# Patient Record
Sex: Female | Born: 1945
Health system: Southern US, Community
[De-identification: ages and names within clinical notes are randomized; demographics above are authoritative.]

## PROBLEM LIST (undated history)

## (undated) DIAGNOSIS — I10 Essential (primary) hypertension: Secondary | ICD-10-CM

## (undated) DIAGNOSIS — E785 Hyperlipidemia, unspecified: Secondary | ICD-10-CM

## (undated) DIAGNOSIS — N393 Stress incontinence (female) (male): Secondary | ICD-10-CM

## (undated) DIAGNOSIS — K259 Gastric ulcer, unspecified as acute or chronic, without hemorrhage or perforation: Secondary | ICD-10-CM

## (undated) DIAGNOSIS — J189 Pneumonia, unspecified organism: Secondary | ICD-10-CM

## (undated) DIAGNOSIS — K7689 Other specified diseases of liver: Secondary | ICD-10-CM

## (undated) DIAGNOSIS — R011 Cardiac murmur, unspecified: Secondary | ICD-10-CM

## (undated) DIAGNOSIS — T7840XA Allergy, unspecified, initial encounter: Secondary | ICD-10-CM

## (undated) DIAGNOSIS — F419 Anxiety disorder, unspecified: Secondary | ICD-10-CM

## (undated) DIAGNOSIS — T8859XA Other complications of anesthesia, initial encounter: Secondary | ICD-10-CM

## (undated) DIAGNOSIS — K219 Gastro-esophageal reflux disease without esophagitis: Secondary | ICD-10-CM

## (undated) DIAGNOSIS — A048 Other specified bacterial intestinal infections: Secondary | ICD-10-CM

## (undated) DIAGNOSIS — H269 Unspecified cataract: Secondary | ICD-10-CM

## (undated) DIAGNOSIS — M1990 Unspecified osteoarthritis, unspecified site: Secondary | ICD-10-CM

## (undated) DIAGNOSIS — K635 Polyp of colon: Secondary | ICD-10-CM

## (undated) DIAGNOSIS — N83209 Unspecified ovarian cyst, unspecified side: Secondary | ICD-10-CM

## (undated) HISTORY — DX: Anxiety disorder, unspecified: F41.9

## (undated) HISTORY — DX: Gastro-esophageal reflux disease without esophagitis: K21.9

## (undated) HISTORY — DX: Hyperlipidemia, unspecified: E78.5

## (undated) HISTORY — DX: Other specified diseases of liver: K76.89

## (undated) HISTORY — PX: HEMORRHOID SURGERY: SHX153

## (undated) HISTORY — DX: Polyp of colon: K63.5

## (undated) HISTORY — DX: Cardiac murmur, unspecified: R01.1

## (undated) HISTORY — DX: Other specified bacterial intestinal infections: A04.8

## (undated) HISTORY — DX: Unspecified ovarian cyst, unspecified side: N83.209

## (undated) HISTORY — DX: Gastric ulcer, unspecified as acute or chronic, without hemorrhage or perforation: K25.9

## (undated) HISTORY — PX: OVARIAN CYST SURGERY: SHX726

## (undated) HISTORY — PX: COLON SURGERY: SHX602

## (undated) HISTORY — DX: Pneumonia, unspecified organism: J18.9

## (undated) HISTORY — DX: Unspecified osteoarthritis, unspecified site: M19.90

## (undated) HISTORY — PX: LAPAROSCOPIC LIVER CYST REMOVAL: SHX5900

## (undated) HISTORY — DX: Essential (primary) hypertension: I10

## (undated) HISTORY — PX: APPENDECTOMY: SHX54

## (undated) HISTORY — DX: Unspecified cataract: H26.9

## (undated) HISTORY — DX: Allergy, unspecified, initial encounter: T78.40XA

---

## 1982-03-22 DIAGNOSIS — N83209 Unspecified ovarian cyst, unspecified side: Secondary | ICD-10-CM

## 1982-03-22 HISTORY — DX: Unspecified ovarian cyst, unspecified side: N83.209

## 1997-09-17 ENCOUNTER — Other Ambulatory Visit: Admission: RE | Admit: 1997-09-17 | Discharge: 1997-09-17 | Payer: Self-pay | Admitting: Obstetrics and Gynecology

## 1998-02-18 ENCOUNTER — Ambulatory Visit (HOSPITAL_COMMUNITY): Admission: RE | Admit: 1998-02-18 | Discharge: 1998-02-18 | Payer: Self-pay | Admitting: Obstetrics and Gynecology

## 1998-03-22 HISTORY — PX: DENTAL SURGERY: SHX609

## 1999-02-09 ENCOUNTER — Encounter (INDEPENDENT_AMBULATORY_CARE_PROVIDER_SITE_OTHER): Payer: Self-pay

## 1999-02-09 ENCOUNTER — Ambulatory Visit (HOSPITAL_COMMUNITY): Admission: RE | Admit: 1999-02-09 | Discharge: 1999-02-09 | Payer: Self-pay | Admitting: *Deleted

## 1999-06-15 ENCOUNTER — Encounter: Admission: RE | Admit: 1999-06-15 | Discharge: 1999-06-15 | Payer: Self-pay | Admitting: Family Medicine

## 2000-01-28 ENCOUNTER — Encounter: Admission: RE | Admit: 2000-01-28 | Discharge: 2000-01-28 | Payer: Self-pay | Admitting: Family Medicine

## 2000-06-15 ENCOUNTER — Other Ambulatory Visit: Admission: RE | Admit: 2000-06-15 | Discharge: 2000-06-15 | Payer: Self-pay | Admitting: Obstetrics and Gynecology

## 2000-08-11 ENCOUNTER — Encounter: Payer: Self-pay | Admitting: General Surgery

## 2000-08-12 ENCOUNTER — Encounter (INDEPENDENT_AMBULATORY_CARE_PROVIDER_SITE_OTHER): Payer: Self-pay | Admitting: Specialist

## 2000-08-12 ENCOUNTER — Ambulatory Visit (HOSPITAL_COMMUNITY): Admission: RE | Admit: 2000-08-12 | Discharge: 2000-08-12 | Payer: Self-pay | Admitting: General Surgery

## 2002-05-16 ENCOUNTER — Encounter: Payer: Self-pay | Admitting: Family Medicine

## 2002-05-16 ENCOUNTER — Encounter: Admission: RE | Admit: 2002-05-16 | Discharge: 2002-05-16 | Payer: Self-pay | Admitting: Family Medicine

## 2003-01-01 ENCOUNTER — Ambulatory Visit (HOSPITAL_COMMUNITY): Admission: RE | Admit: 2003-01-01 | Discharge: 2003-01-01 | Payer: Self-pay | Admitting: *Deleted

## 2003-05-21 ENCOUNTER — Encounter: Admission: RE | Admit: 2003-05-21 | Discharge: 2003-05-21 | Payer: Self-pay | Admitting: Family Medicine

## 2004-06-03 ENCOUNTER — Encounter: Admission: RE | Admit: 2004-06-03 | Discharge: 2004-06-03 | Payer: Self-pay | Admitting: Family Medicine

## 2004-06-24 ENCOUNTER — Encounter: Admission: RE | Admit: 2004-06-24 | Discharge: 2004-06-24 | Payer: Self-pay | Admitting: Family Medicine

## 2004-12-07 ENCOUNTER — Encounter: Admission: RE | Admit: 2004-12-07 | Discharge: 2004-12-07 | Payer: Self-pay | Admitting: Family Medicine

## 2005-06-08 ENCOUNTER — Ambulatory Visit (HOSPITAL_BASED_OUTPATIENT_CLINIC_OR_DEPARTMENT_OTHER): Admission: RE | Admit: 2005-06-08 | Discharge: 2005-06-08 | Payer: Self-pay | Admitting: Orthopedic Surgery

## 2005-06-08 ENCOUNTER — Encounter (INDEPENDENT_AMBULATORY_CARE_PROVIDER_SITE_OTHER): Payer: Self-pay | Admitting: *Deleted

## 2005-07-14 ENCOUNTER — Encounter: Admission: RE | Admit: 2005-07-14 | Discharge: 2005-07-14 | Payer: Self-pay | Admitting: Family Medicine

## 2006-07-27 ENCOUNTER — Encounter: Admission: RE | Admit: 2006-07-27 | Discharge: 2006-07-27 | Payer: Self-pay | Admitting: Family Medicine

## 2007-10-06 ENCOUNTER — Encounter: Admission: RE | Admit: 2007-10-06 | Discharge: 2007-10-06 | Payer: Self-pay | Admitting: Family Medicine

## 2008-10-07 ENCOUNTER — Encounter: Admission: RE | Admit: 2008-10-07 | Discharge: 2008-10-07 | Payer: Self-pay | Admitting: Family Medicine

## 2008-12-06 ENCOUNTER — Ambulatory Visit (HOSPITAL_BASED_OUTPATIENT_CLINIC_OR_DEPARTMENT_OTHER): Admission: RE | Admit: 2008-12-06 | Discharge: 2008-12-06 | Payer: Self-pay | Admitting: Orthopedic Surgery

## 2010-03-26 ENCOUNTER — Encounter
Admission: RE | Admit: 2010-03-26 | Discharge: 2010-03-26 | Payer: Self-pay | Source: Home / Self Care | Attending: Family Medicine | Admitting: Family Medicine

## 2010-08-07 NOTE — Op Note (Signed)
[facility]  Patient:    Kristen Schultz, Kristen Schultz                          MRN: [id number] Proc. Date: 08/12/00 Attending:  Sheppard Plumber. Earlene Plater, M.D.                           Operative Report  PREOPERATIVE DIAGNOSIS:  Internal and external hemorrhoids.  POSTOPERATIVE DIAGNOSIS:  Internal and external hemorrhoids.  OPERATION:  Hemorrhoidectomy.  SURGEON:  Timothy E. Earlene Plater, M.D.  ANESTHESIA:  Local standby.  INDICATIONS:  Ms. Stawicki had had prolapsing, soilage, bleeding and pain from her hemorrhoids.  She has used conservative management to the maximum and continues to have trouble.  After careful discussion, she wishes to proceed with surgical correction.  DESCRIPTION OF PROCEDURE:  The patient was brought to the operating room and placed supine, heavy IV sedation provided, placed in lithotomy.  The area inspected and prepped and draped in the usual fashion.  Remarkable hemorrhoids were noted in the right posterior, left posterior, and left anterior positions.  There was an additional internal hemorrhoid in the anterior position.  The area was injected around and about with Marcaine plain mixed 9:1 with Wydase.  This was massaged in well; the normal anatomy was restored, and then careful dissection was accomplished, removing all three of the major hemorrhoids in a similar fashion by placing a 2-0 chromic at the apex of the hemorrhoids and careful skinny dissection of the hemorrhoidal mass, undermining of the edges, and closure of the wound with a running 2-0 chromic suture.  This was accomplished on the left anterior, left posterior, and right posterior positions.  The anterior internal hemorrhoid was band ligated. All areas were checked.  There was no bleeding or complication.  Sphincters were intact.  Gelfoam, gauze, and dry sterile dressing applied.  She tolerated it well and was taken to the recovery room in good condition.  Written and verbal instructions  were given her and her husband including Percocet 5 mg #36, and she will be followed as an outpatient. DD:  08/12/00 TD:  08/12/00 Job: [id number]

## 2010-08-07 NOTE — Op Note (Signed)
   NAME:  Kristen Schultz, Kristen Schultz                           ACCOUNT NO.:  [id number]   MEDICAL RECORD NO.:  [id number]                   PATIENT TYPE:  AMB   LOCATION:  ENDO                                 FACILITY:  [facility]   PHYSICIAN:  Georgiana Spinner, M.D.                 DATE OF BIRTH:  [birth year]   DATE OF PROCEDURE:  01/01/2003  DATE OF DISCHARGE:                                 OPERATIVE REPORT   PROCEDURE:  Colonoscopy.   ENDOSCOPIST:  Georgiana Spinner, M.D.   INDICATIONS:  Colon polyps.   ANESTHESIA:  Demerol 70 mg, Versed 7 mg.   DESCRIPTION OF PROCEDURE:  With the patient mildly sedated in the left  lateral decubitus position, the Olympus videoscopic colonoscope was inserted  in the rectum  after normal rectal exam and passed under direct vision to  the cecum, identified by ileocecal valve and appendiceal orifice both of  which were photographed.  From this point, the colonoscope was slowly  withdrawn taking circumferential views of the entire colonic mucosa stopping  to photograph moderate degree of diverticulosis in the sigmoid colon until  we reached the rectum which appeared normal on direct and showed small  hemorrhoids on retroflexed view.  The endoscope was straightened and  withdrawn.  The patient's vital signs and pulse oximeter remained stable.  The patient tolerated the procedure well without apparent complications.   FINDINGS:  1. Diverticulosis of the sigmoid colon.  2. Internal hemorrhoids.  3. Otherwise unremarkable colonoscopic examination to the cecum.   PLAN:  Repeat examination in 5 years because of previous polyps.                                               Georgiana Spinner, M.D.    GMO/MEDQ  D:  01/01/2003  T:  01/01/2003  Job:  [id number]   cc:   Tammy R. Collins [place], M.D.  [address]  [place]  [place] [address]  Fax: [phone]

## 2010-08-07 NOTE — Op Note (Signed)
NAMERUBIE, FICCO                 ACCOUNT NO.:  [id number]   MEDICAL RECORD NO.:  [id number]          PATIENT TYPE:  AMB   LOCATION:  DSC                          FACILITY:  [facility]   PHYSICIAN:  Katy Fitch. Sypher, M.D. DATE OF BIRTH:  10-31-1945   DATE OF PROCEDURE:  06/08/2005  DATE OF DISCHARGE:                                 OPERATIVE REPORT   PREOPERATIVE DIAGNOSES:  1.  Chronic splinter foreign body, right long finger, middle phalangeal      palmar segment.  2.  Chronic stenosing tenosynovitis, left long finger, at A1 pulley with      marked swelling of flexor sheath consistent with chronic synovitis.   POSTOPERATIVE DIAGNOSES:  1.  Chronic splinter foreign body, right long finger, middle phalangeal      palmar segment.  2.  Chronic stenosing tenosynovitis, left long finger, at A1 pulley with      marked swelling of flexor sheath consistent with chronic synovitis.   OPERATION:  1.  Excision of right long finger deep splinter with incision and drainage      of small abscess surrounding splinter.  2.  Release of left long finger A1 pulley with synovial biopsy and      synovectomy.   OPERATING SURGEON:  Katy Fitch. Sypher, M.D.   ASSISTANT:  Annye Rusk, P.A.-C.   ANESTHESIA:  General sedation and 2% lidocaine metacarpal head level block  of right and left long fingers.   SUPERVISING ANESTHESIOLOGIST:  Zenon Mayo, MD   INDICATIONS:  Anasophia Pecor is a 65 year old woman referred for evaluation and  management of a very arthritic right long finger.   She was noted to have significant ulnar deviation of the right long finger  at the PIP joint and only 0-65 degrees active flexion.  She was having  increasing pain.  She was noted have a chronic wood foreign body splinter  with a small abscess surrounding this splinter.  She requested removal of  the splinter.  We advised her that we could not consider implant  arthroplasty while removing a probably colonized  foreign body.   In addition, she was noted to have a chronic stenosing tenosynovitis of her  left long finger with swelling over the flexor sheath and pain at the A1  pulley.  She had active locking in flexion.   After a lengthy informed consent in the office, she is scheduled this time  for removal of her wood foreign body from the right long finger and release  of her left long finger A1 pulley, anticipating synovial biopsy and  synovectomy.   PROCEDURE:  Camya Haydon was brought to the operating room and placed in  supine position upon the operating table.   Following the induction of general sedation, the right and left arms were  prepped with Betadine soaping solution and sterilely draped.  A pneumatic  tourniquet was applied to the proximal left brachium.  A digital tourniquet  will be used on the right long finger.   We initiated the procedure on the left.   The left arm was exsanguinated with  an Esmarch bandage and the arterial  tourniquet on the proximal brachium inflated to 250 mmHg.  The procedure  commenced with infiltration of 2% lidocaine into the path of the intended  incision and into the flexor sheath of the left long finger.   The skin incision was taken sharply when anesthesia was confirmed to be  satisfactory.   A 1.5-cm oblique incision was fashioned in the distal palmar crease.  Subcutaneous tissues were carefully divided, taking care to release the  palmar fascia.  The A1 pulley was isolated and found be bulging with  synovium.  A rongeur was used to clear the synovium penetrating through the  pulley and after careful identification of the margins of the pulley and  retraction of the neurovascular bundles, the A1 pulley was released along  its radial aspect with a scalpel and scissors.  No A0 pulley was noted.  The  release was extended towards the junction between the A1 and A2 pulleys.   The tendons were delivered and found be invested with a very thick   tenosynovium.  This was harvested and passed off for synovial biopsy.  After  synovectomy was completed, full range of motion of the left long finger was  recovered.   The wound was then repaired with mattress suture of 5-0 nylon x3.  There  were no apparent complications.  The wound was dressed with Xeroflo, sterile  gauze and an Ace bandage.  The tourniquet released with immediate capillary  refill to all fingers and the thumb.   Attention was then directed to the right long finger.   After routine Betadine prep and drape of the right hand, the right long  finger was anesthetized with a 2% lidocaine metacarpal head level block.   When anesthesia was satisfactory, the finger was exsanguinated with a gauze  wrap and an inch-wide band of Esmarch bandage was used as a drain-equivalent  at the P1 segment as a tourniquet.   A small incision was fashioned over the point of maximal tenderness with  immediate recovery of purulent material.  The wound was carefully palpated  and the splinter expressed.  The wound was then probed with a blunt hemostat  and no other masses identified.  There was a significant amount of fibrosis  surrounding the chronic abscess.  The  abscess was irrigated and subsequently will be allowed to close by secondary  intention.  The finger was dressed with Xeroflo, sterile gauze and Coban.  There were no apparent complications..   Ms. Mazurek was awakened from her sedation and transferred to the recovery  room with stable signs.      Katy Fitch Sypher, M.D.  Electronically Signed     RVS/MEDQ  D:  06/08/2005  T:  06/09/2005  Job:  [id number]

## 2011-06-29 ENCOUNTER — Telehealth: Payer: Self-pay

## 2011-06-29 NOTE — Telephone Encounter (Signed)
Pt called asking For Copies Of her Records, she will be by 06/30/11 To pick these Up, Records are Copied & Ready,ROI needs to  Be Signed.... 06/29/11/Km

## 2011-06-30 ENCOUNTER — Other Ambulatory Visit: Payer: Self-pay | Admitting: Family Medicine

## 2011-06-30 DIAGNOSIS — Z1231 Encounter for screening mammogram for malignant neoplasm of breast: Secondary | ICD-10-CM

## 2011-06-30 DIAGNOSIS — M8580 Other specified disorders of bone density and structure, unspecified site: Secondary | ICD-10-CM

## 2011-06-30 NOTE — Telephone Encounter (Signed)
Records Picked up By Pt  06/30/11/KM

## 2011-07-29 ENCOUNTER — Ambulatory Visit
Admission: RE | Admit: 2011-07-29 | Discharge: 2011-07-29 | Disposition: A | Discharge status: Home / Self Care | Payer: Medicare Other | Source: Ambulatory Visit | Attending: Family Medicine | Admitting: Family Medicine

## 2011-07-29 DIAGNOSIS — Z1231 Encounter for screening mammogram for malignant neoplasm of breast: Secondary | ICD-10-CM

## 2011-07-29 DIAGNOSIS — M8580 Other specified disorders of bone density and structure, unspecified site: Secondary | ICD-10-CM

## 2011-08-04 ENCOUNTER — Encounter: Payer: Self-pay | Admitting: *Deleted

## 2012-06-27 ENCOUNTER — Other Ambulatory Visit: Payer: Self-pay

## 2012-06-27 DIAGNOSIS — Z1231 Encounter for screening mammogram for malignant neoplasm of breast: Secondary | ICD-10-CM

## 2012-08-03 ENCOUNTER — Ambulatory Visit: Payer: Medicare Other

## 2012-08-08 ENCOUNTER — Other Ambulatory Visit: Payer: Self-pay | Admitting: Family Medicine

## 2012-08-08 DIAGNOSIS — R202 Paresthesia of skin: Secondary | ICD-10-CM

## 2012-08-08 DIAGNOSIS — M79629 Pain in unspecified upper arm: Secondary | ICD-10-CM

## 2012-08-22 ENCOUNTER — Ambulatory Visit
Admission: RE | Admit: 2012-08-22 | Discharge: 2012-08-22 | Disposition: A | Discharge status: Home / Self Care | Payer: Medicare Other | Source: Ambulatory Visit | Attending: Family Medicine | Admitting: Family Medicine

## 2012-08-22 DIAGNOSIS — M79629 Pain in unspecified upper arm: Secondary | ICD-10-CM

## 2012-08-22 DIAGNOSIS — R202 Paresthesia of skin: Secondary | ICD-10-CM

## 2014-11-08 ENCOUNTER — Telehealth: Payer: Self-pay | Admitting: Behavioral Health

## 2014-11-08 ENCOUNTER — Encounter: Payer: Self-pay | Admitting: Behavioral Health

## 2014-11-08 NOTE — Telephone Encounter (Signed)
Pre-Visit Call completed with patient and chart updated.   Pre-Visit Info documented in Specialty Comments under SnapShot.    

## 2014-11-08 NOTE — Telephone Encounter (Signed)
Unable to reach patient at time of Pre-Visit Call.  Left message for patient to return call when available.    

## 2014-11-08 NOTE — Addendum Note (Signed)
Addended by: Kathlen Brunswick on: 11/08/2014 05:02 PM   Modules accepted: Orders, Medications

## 2014-11-11 ENCOUNTER — Ambulatory Visit (INDEPENDENT_AMBULATORY_CARE_PROVIDER_SITE_OTHER): Payer: Medicare Other | Admitting: Family Medicine

## 2014-11-11 ENCOUNTER — Encounter: Payer: Self-pay | Admitting: Family Medicine

## 2014-11-11 VITALS — BP 140/82 | HR 67 | Temp 98.0°F | Ht 66.0 in | Wt 162.0 lb

## 2014-11-11 DIAGNOSIS — L0100 Impetigo, unspecified: Secondary | ICD-10-CM

## 2014-11-11 DIAGNOSIS — H9313 Tinnitus, bilateral: Secondary | ICD-10-CM | POA: Diagnosis not present

## 2014-11-11 DIAGNOSIS — E2839 Other primary ovarian failure: Secondary | ICD-10-CM | POA: Diagnosis not present

## 2014-11-11 DIAGNOSIS — I10 Essential (primary) hypertension: Secondary | ICD-10-CM | POA: Diagnosis not present

## 2014-11-11 DIAGNOSIS — Z1231 Encounter for screening mammogram for malignant neoplasm of breast: Secondary | ICD-10-CM | POA: Diagnosis not present

## 2014-11-11 DIAGNOSIS — F411 Generalized anxiety disorder: Secondary | ICD-10-CM

## 2014-11-11 LAB — POCT URINALYSIS DIPSTICK
Bilirubin, UA: NEGATIVE
Glucose, UA: NEGATIVE
KETONES UA: NEGATIVE
LEUKOCYTES UA: NEGATIVE
NITRITE UA: NEGATIVE
PH UA: 8
PROTEIN UA: NEGATIVE
RBC UA: NEGATIVE
Spec Grav, UA: 1.015
Urobilinogen, UA: 2

## 2014-11-11 LAB — COMPREHENSIVE METABOLIC PANEL
ALK PHOS: 52 U/L (ref 39–117)
ALT: 13 U/L (ref 0–35)
AST: 23 U/L (ref 0–37)
Albumin: 4.2 g/dL (ref 3.5–5.2)
BILIRUBIN TOTAL: 0.6 mg/dL (ref 0.2–1.2)
BUN: 15 mg/dL (ref 6–23)
CO2: 32 meq/L (ref 19–32)
Calcium: 9.5 mg/dL (ref 8.4–10.5)
Chloride: 98 mEq/L (ref 96–112)
Creatinine, Ser: 0.64 mg/dL (ref 0.40–1.20)
GFR: 97.88 mL/min (ref >60.00)
GLUCOSE: 84 mg/dL (ref 70–99)
Potassium: 4.1 mEq/L (ref 3.5–5.1)
SODIUM: 137 meq/L (ref 135–145)
TOTAL PROTEIN: 7.3 g/dL (ref 6.0–8.3)

## 2014-11-11 LAB — LIPID PANEL
CHOL/HDL RATIO: 3
Cholesterol: 228 mg/dL — ABNORMAL HIGH (ref 0–200)
HDL: 77 mg/dL (ref >39.00)
LDL Cholesterol: 130 mg/dL — ABNORMAL HIGH (ref 0–99)
NONHDL: 151.45
Triglycerides: 106 mg/dL (ref 0.0–149.0)
VLDL: 21.2 mg/dL (ref 0.0–40.0)

## 2014-11-11 MED ORDER — LOSARTAN POTASSIUM-HCTZ 50-12.5 MG PO TABS: 1.0000 | ORAL_TABLET | Freq: Every day | ORAL | Status: DC

## 2014-11-11 MED ORDER — TRIAMCINOLONE ACETONIDE 0.1 % EX CREA: 1.0000 "application " | TOPICAL_CREAM | Freq: Two times a day (BID) | CUTANEOUS | Status: DC

## 2014-11-11 MED ORDER — CITALOPRAM HYDROBROMIDE 10 MG PO TABS: 10.0000 mg | ORAL_TABLET | Freq: Every day | ORAL | Status: DC

## 2014-11-11 MED ORDER — MUPIROCIN CALCIUM 2 % EX CREA: 1.0000 "application " | TOPICAL_CREAM | Freq: Three times a day (TID) | CUTANEOUS | Status: DC

## 2014-11-11 NOTE — Progress Notes (Signed)
Pre visit review using our clinic review tool, if applicable. No additional management support is needed unless otherwise documented below in the visit note. 

## 2014-11-11 NOTE — Progress Notes (Signed)
Patient ID: Kristen Schultz, female   DOB: 18-Nov-1945, 69 y.o.   MRN: [id number]   RUHEE ENCK  female [id number] Feb 01, 1946 69 y.o. 11/12/2014      Progress Note-Follow Up  Subjective   HPI  Patient is in today to establish and c/o rash on L side of chin. She has tried otc creams with no relief     Chief Complaint  Patient presents with  . Establish Care    Medication Refills- Requesting labs but not fasting.  . Rash    on the left side of chin x's 2 months    Past Medical History  Diagnosis Date  . Hypertension   . Hyperlipidemia   . Anxiety     Past Surgical History  Procedure Laterality Date  . Hemorrhoid surgery    . Laparoscopic liver cyst removal    . Ovarian cyst surgery      Family History  Problem Relation Age of Onset  . Heart disease Neg Hx   . Glaucoma Neg Hx     Social History   Social History  . Marital Status: Married    Spouse Name: N/A  . Number of Children: N/A  . Years of Education: N/A   Occupational History  . Not on file.   Social History Main Topics  . Smoking status: Former Research scientist (life sciences)  . Smokeless tobacco: Not on file  . Alcohol Use: No  . Drug Use: Not on file  . Sexual Activity: Not on file   Other Topics Concern  . Not on file   Social History Narrative    Current Outpatient Prescriptions on File Prior to Visit  Medication Sig Dispense Refill  . Ascorbic Acid (VITAMIN C) 1000 MG tablet Take 1,000 mg by mouth 3 (three) times a week.     Marland Kitchen aspirin 81 MG tablet Take 81 mg by mouth daily.    . Multiple Vitamin (MULTIVITAMIN) capsule Take 1 capsule by mouth daily.     No current facility-administered medications on file prior to visit.    Allergies  Allergen Reactions  . Codeine     Extreme Vomiting  . Penicillins     Redness, swelling and itching  . Tetracyclines & Related     Yeast Infection and Stomach problems    Review of Systems  Review of Systems  Constitutional: Negative for fever, chills and  malaise/fatigue.  HENT: Negative for congestion and hearing loss.   Eyes: Negative for discharge.  Respiratory: Negative for cough, sputum production and shortness of breath.   Cardiovascular: Negative for chest pain, palpitations and leg swelling.  Gastrointestinal: Negative for heartburn, nausea, vomiting, abdominal pain, diarrhea, constipation and blood in stool.  Genitourinary: Negative for dysuria, urgency, frequency and hematuria.  Musculoskeletal: Negative for myalgias, back pain and falls.  Skin: Negative for rash.  Neurological: Negative for dizziness, sensory change, loss of consciousness, weakness and headaches.  Endo/Heme/Allergies: Negative for environmental allergies. Does not bruise/bleed easily.  Psychiatric/Behavioral: Negative for depression and suicidal ideas. The patient is not nervous/anxious and does not have insomnia.     Objective  Filed Vitals:   11/11/14 1042  BP: 140/82  Pulse: 67  Temp: 98 F (36.7 C)  TempSrc: Oral  Height: 5\' 6"  (1.676 m)  Weight: 162 lb (73.483 kg)  SpO2: 98%   Body mass index is 26.16 kg/(m^2).  Physical Exam  Physical Exam  Constitutional: She is well-developed, well-nourished, and in no distress. No distress.  HENT:  Right Ear: External ear  normal.  Left Ear: External ear normal.  Mouth/Throat: No oropharyngeal exudate.  Eyes: Conjunctivae are normal. Pupils are equal, round, and reactive to light. Right eye exhibits no discharge. Left eye exhibits no discharge. No scleral icterus.  Neck: Normal range of motion. Neck supple. No JVD present. No tracheal deviation present.  Cardiovascular: Normal rate and regular rhythm.   No murmur heard. Pulmonary/Chest: Effort normal and breath sounds normal. No stridor. No respiratory distress.  Abdominal: Soft. Bowel sounds are normal. She exhibits no distension. There is no tenderness. There is no rebound and no guarding.  Musculoskeletal: Normal range of motion. She exhibits no edema  or tenderness.  Lymphadenopathy:    She has no cervical adenopathy.  Neurological: She is alert. She has normal reflexes. Gait normal.  Skin: Rash noted. She is not diaphoretic.     Psychiatric: Memory, affect and judgment normal.    No results found for: TSH No results found for: WBC, HGB, HCT, MCV, PLT Lab Results  Component Value Date   GFR 97.88 11/11/2014   Lab Results  Component Value Date   CHOL 228* 11/11/2014   Lab Results  Component Value Date   HDL 77.00 11/11/2014   Lab Results  Component Value Date   LDLCALC 130* 11/11/2014   Lab Results  Component Value Date   TRIG 106.0 11/11/2014   Lab Results  Component Value Date   CHOLHDL 3 11/11/2014   No results found for: HGBA1C    Assessment & Plan 1. Encounter for screening mammogram for malignant neoplasm of breast   - MM Digital Screening; Future  2. Estrogen deficiency   - DG Bone Density; Future  3. Tinnitus, bilateral    - Ambulatory referral to Audiology  4. Generalized anxiety disorder  celexa  5. Essential hypertension losartan  - Comprehensive metabolic panel - Lipid panel - POCT urinalysis dipstick  6. Impetigo bactroban and cortisone cream

## 2014-11-11 NOTE — Patient Instructions (Addendum)
Your Mammogram and Bone Density Scan has been scheduled for January 09, 2015  at 9 am  Address: [address], [place], [place] [address]  Hours:   Monday 7AM-6:30PM  Tuesday 7AM-5PM  Wednesday 7AM-5PM  Thursday 7AM-5PM  Friday 7AM-5PM  Saturday Closed  Sunday Closed  Phone: [phone]   Tinnitus Sounds you hear in your ears and coming from within the ear is called tinnitus. This can be a symptom of many ear disorders. It is often associated with hearing loss.  Tinnitus can be seen with:  Infections.  Ear blockages such as wax buildup.  Meniere's disease.  Ear damage.  Inherited.  Occupational causes. While irritating, it is not usually a threat to health. When the cause of the tinnitus is wax, infection in the middle ear, or foreign body it is easily treated. Hearing loss will usually be reversible.  TREATMENT  When treating the underlying cause does not get rid of tinnitus, it may be necessary to get rid of the unwanted sound by covering it up with more pleasant background noises. This may include music, the radio etc. There are tinnitus maskers which can be worn which produce background noise to cover up the tinnitus. Avoid all medications which tend to make tinnitus worse such as alcohol, caffeine, aspirin, and nicotine. There are many soothing background tapes such as rain, ocean, thunderstorms, etc. These soothing sounds help with sleeping or resting. Keep all follow-up appointments and referrals. This is important to identify the cause of the problem. It also helps avoid complications, impaired hearing, disability, or chronic pain. Document Released: 03/08/2005 Document Revised: 05/31/2011 Document Reviewed: 10/25/2007 [facility] Patient Information 2015 [place], [place]. This information is not intended to replace advice given to you by your health care provider. Make sure you discuss any questions you have with your health care provider.

## 2014-11-12 DIAGNOSIS — L0100 Impetigo, unspecified: Secondary | ICD-10-CM | POA: Insufficient documentation

## 2014-11-12 NOTE — Assessment & Plan Note (Signed)
con't celexa rto 1 month

## 2014-11-12 NOTE — Assessment & Plan Note (Signed)
bactroban Cortisone cream

## 2014-12-03 ENCOUNTER — Telehealth: Payer: Self-pay | Admitting: *Deleted

## 2014-12-03 NOTE — Telephone Encounter (Signed)
Medical records received via mail from [place]. Forwarded to Kim/Dr.Lowne. JG//CMA

## 2015-01-09 ENCOUNTER — Ambulatory Visit
Admission: RE | Admit: 2015-01-09 | Discharge: 2015-01-09 | Disposition: A | Discharge status: Home / Self Care | Payer: Medicare Other | Source: Ambulatory Visit | Attending: Family Medicine | Admitting: Family Medicine

## 2015-01-09 DIAGNOSIS — E2839 Other primary ovarian failure: Secondary | ICD-10-CM

## 2015-01-09 DIAGNOSIS — Z1231 Encounter for screening mammogram for malignant neoplasm of breast: Secondary | ICD-10-CM | POA: Diagnosis not present

## 2015-01-13 ENCOUNTER — Ambulatory Visit: Payer: Medicare Other | Attending: Audiology | Admitting: Audiology

## 2015-01-13 DIAGNOSIS — Z01118 Encounter for examination of ears and hearing with other abnormal findings: Secondary | ICD-10-CM | POA: Diagnosis not present

## 2015-01-13 DIAGNOSIS — H9319 Tinnitus, unspecified ear: Secondary | ICD-10-CM | POA: Diagnosis not present

## 2015-01-13 DIAGNOSIS — H93293 Other abnormal auditory perceptions, bilateral: Secondary | ICD-10-CM | POA: Insufficient documentation

## 2015-01-13 DIAGNOSIS — R94128 Abnormal results of other function studies of ear and other special senses: Secondary | ICD-10-CM | POA: Insufficient documentation

## 2015-01-13 DIAGNOSIS — R292 Abnormal reflex: Secondary | ICD-10-CM | POA: Insufficient documentation

## 2015-01-13 DIAGNOSIS — H833X3 Noise effects on inner ear, bilateral: Secondary | ICD-10-CM

## 2015-01-13 DIAGNOSIS — H9193 Unspecified hearing loss, bilateral: Secondary | ICD-10-CM | POA: Insufficient documentation

## 2015-01-13 NOTE — Procedures (Signed)
Outpatient Rehabilitation and [facility] [address] [place], [place] [address] [place] EVALUATION  Name: Kristen Schultz [facility]   Outpatient DOB: 1945-10-20    Referent: Garnet Koyanagi, DO MRN: [id number] Date: 01/13/2015    Diagnosis: Tinnitus, hearing loss  HISTORY: Kristen Schultz age 69 y.o. years, was seen for an audiological evaluation. She reports a history of "cricket like tinnitus" in both ears and/or central head area that she rates on as a 5 on a scale of 1 (no impact) to 10 (ruined).  She stats that the loudness of the tinnitus "varies" and that "today the tinnitus is softer than usual".   Kristen Schultz also experiences "seconds" of unsteadiness at times, but denies periods of vertigo.  Kristen Schultz states that she "enjoys being social" but is experiencing some difficulty hearing in background noise, although she is "not aware of hearing loss". At the same time she is experiencing "sound sensitivity" so sounds including some "TV voices". Kristen Schultz reports that her "dentist has done x-rays and noticed left sinus problems".          EVALUATION: Pure tone air and tone conduction was completed using conventional audiometry and inserts. Hearing thresholds are symmetrical at 30-35 dBHL from 250Hz  - 500Hz ; 15-25 dBHL from 750Hz  - 2000hz ; 25-30 dBHL at 4000Hz  ; 35-40 dBHL at 6000Hz  and 50-60 dBHL at 8000Hz .  The hearing loss is sensorineural on the right side with a slight conductive component reported at 250Hz  only-that was most likely a vibrotactile response after discussion.  The left ear has a mixed component-especially in the high frequencies. Otoscopic inspection reveals clear ear canals with visible tympanic membranes.  Tympanometry showed in the left ear shows a long left leg on the graph, consistent with conductive issues.  The right tympanogram is within normal limits (Type A).  Ipsilateral acoustic reflexes are absent bilaterally.  Distortion Product  Otoacoustic Emission (DPOAE) testing from 2000Hz  - 10,000Hz  are absent bilaterally. Tinnitus matching appeared to be approximately 54dBHL using speech noise.  The tinnitus suppression was inconclusive. Uncomfortable Loudness Levels support the history of sound sensitivity/recruitment - she reports volume of 85-95 "hurt".  Daylene Vandenbosch Sanroman reports volumes equivalent to conversational speech levels to a busy office bother her.    CONCLUSION:      Kristen Schultz has a mild low frequency hearing loss, normal hearing in the mid range and a sloping mild to moderate high frequency hearing loss that is symmetrical, bilaterally.  The left ear has a mixed component.  The right ear hearing loss is primarily sensorineural-note the slight conductive component at 250Hz  only, upon discussion was most likely a vibro-tactile response.  Word recognition is excellent in quiet at conversational speech levels bilaterally. In minimal background noise, word recognition drops to poor in each ear, but is poorer on the left side.  Middle ear function is borderline on the left side, with a long left leg, possibly associated with conductive issues but is within normal limits on the right side. Also abnormal are the ipsilateral acoustic reflexes bilaterally, which requires monitoring.  Recruitment is evident bilaterally.  The tinnitus is measured at 54dBHL which is equivalent to conversational speech levels. Of concern is that Kristen Schultz states that the tinnitus was relatively "quiet" today - not as loud as it sometimes becomes.  Kristen Schultz has already discovered the importance of masking the tinnitus and trying to not focus on it.  Continued use of these techniques was recommended.  In addition, Kristen Burkitt  Schultz  may be an excellent candidate for tinnitus therapies and also may benefit from amplification; therefore further evaluation, following consultation with an Ear, Nose and Throat physician is recommended.    RECOMMENDATIONS: 1.   Further  evaluation by an Ear, Nose and Throat physician with repeat audiological testing to evaluate a) tinnitus b) intermittent dizziness when eyes are closed, c) abnormal ipsilateral acoustic reflexes bilaterally and d) bilateral hearing loss.  2.   Consider tinnitus treatment/masking and or amplification.  The following are tinnitus recommendations: 1) use hearing protection when around loud noise to protect from noise-induced hearing loss.  2) refocus attention away from the tinnitus or offending sound onto something enjoyable.  3) avoid silence, use enjoyable background music or a sound machine to mask tinnitus.  Please be aware that there is treatment for tinnitus.  Start first with ENT recommendations and be aware that Kristen Ina, PhD at the  [facility] Tinnitus and [facility] may provide assistance or be a resource for tinnitus treatment or information [phone]).  3.  Closely monitor hearing with a repeat evaluation in 6 months- earlier if there is a change in hearing or tinnitus.   This may be completed here or at the ENT office.  4.  Strategies that help improve hearing include: A) Face the speaker directly. Optimal is having the speakers face well - lit.  Unless amplified, being within 3-6 feet of the speaker will enhance word recognition. B) Avoid having the speaker back-lit as this will minimize the ability to use cues from lip-reading, facial expression and gestures. C)  Word recognition is poorer in background noise. For optimal word recognition, turn off the TV, radio or noisy fan when engaging in conversation. In a restaurant, try to sit away from noise sources and close to the primary speaker. As an aside, consider wireless TV earphones if others in the household have the TV at communication interfering levels to enhance quiet or use consider using this assistive listening device yourself clarify. D)  Ask for topic clarification from time to time in order to remain in the  conversation.  Most people don't mind repeating or clarifying a point when asked.  If needed, explain the difficulty hearing in background noise or hearing loss.  Kristen Schultz, Au.D., CCC-A Doctor of Audiology

## 2015-01-17 ENCOUNTER — Ambulatory Visit (INDEPENDENT_AMBULATORY_CARE_PROVIDER_SITE_OTHER): Payer: Medicare Other

## 2015-01-17 DIAGNOSIS — Z23 Encounter for immunization: Secondary | ICD-10-CM | POA: Diagnosis not present

## 2015-02-11 ENCOUNTER — Ambulatory Visit
Admission: RE | Admit: 2015-02-11 | Discharge: 2015-02-11 | Disposition: A | Discharge status: Home / Self Care | Payer: Medicare Other | Source: Ambulatory Visit | Attending: Family Medicine | Admitting: Family Medicine

## 2015-02-11 DIAGNOSIS — M85852 Other specified disorders of bone density and structure, left thigh: Secondary | ICD-10-CM | POA: Diagnosis not present

## 2015-02-18 ENCOUNTER — Other Ambulatory Visit: Payer: Self-pay | Admitting: Family Medicine

## 2015-03-06 ENCOUNTER — Encounter: Payer: Self-pay | Admitting: Medical

## 2015-03-06 ENCOUNTER — Ambulatory Visit (INDEPENDENT_AMBULATORY_CARE_PROVIDER_SITE_OTHER): Payer: Medicare Other | Admitting: Medical

## 2015-03-06 VITALS — BP 128/80 | HR 67 | Temp 98.0°F | Ht 66.0 in | Wt 164.2 lb

## 2015-03-06 DIAGNOSIS — R82998 Other abnormal findings in urine: Secondary | ICD-10-CM

## 2015-03-06 DIAGNOSIS — N390 Urinary tract infection, site not specified: Secondary | ICD-10-CM | POA: Diagnosis not present

## 2015-03-06 DIAGNOSIS — R300 Dysuria: Secondary | ICD-10-CM | POA: Diagnosis not present

## 2015-03-06 LAB — POCT URINALYSIS DIPSTICK
PH UA: 7
SPEC GRAV UA: 1.015
UROBILINOGEN UA: 4

## 2015-03-06 MED ORDER — CIPROFLOXACIN HCL 500 MG PO TABS: 500.0000 mg | ORAL_TABLET | Freq: Two times a day (BID) | ORAL | Status: DC

## 2015-03-06 NOTE — Patient Instructions (Addendum)
You appear to have a urinary tract infection. I am prescribing  cipro antibiotic for the probable infection. Hydrate well. I am sending out a urine culture. During the interim if your signs and symptoms worsen rather than improving please notify us. We will notify your when the culture results are back.  Follow up in 7 days or as needed. 

## 2015-03-06 NOTE — Progress Notes (Signed)
Pre visit review using our clinic review tool, if applicable. No additional management support is needed unless otherwise documented below in the visit note. 

## 2015-03-06 NOTE — Progress Notes (Signed)
Subjective:    Patient ID: Kristen Schultz, female    DOB: Aug 14, 1945, 69 y.o.   MRN: [id number]  HPI  Pt in today reporting urinary symptoms x 1 day.  Dysuria-  Yes. Frequent urination-yes Hesitancy-no Suprapubic pressure-Yes. Fever-no chills-no Nausea-no Vomiting-no CVA pain-no History of UTI- yes. Hx of intermittent infection in the past. About one year since last infection. Gross hematuria-no  Pt did take some azostandard  this morning.    Review of Systems  Constitutional: Negative for fever, chills and fatigue.  Respiratory: Negative for cough, chest tightness, shortness of breath and wheezing.   Cardiovascular: Negative for chest pain and palpitations.  Gastrointestinal: Negative for abdominal pain.       Suprapubic pressure mild.  Genitourinary: Positive for dysuria and frequency. Negative for urgency, flank pain, vaginal bleeding, vaginal pain and pelvic pain.  Musculoskeletal: Negative for back pain.  Hematological: Negative for adenopathy.  Psychiatric/Behavioral: Negative for behavioral problems and confusion.    Past Medical History  Diagnosis Date  . Hypertension   . Hyperlipidemia   . Anxiety     Social History   Social History  . Marital Status: Married    Spouse Name: N/A  . Number of Children: N/A  . Years of Education: N/A   Occupational History  . Not on file.   Social History Main Topics  . Smoking status: Former Research scientist (life sciences)  . Smokeless tobacco: Not on file  . Alcohol Use: No  . Drug Use: Not on file  . Sexual Activity: Not on file   Other Topics Concern  . Not on file   Social History Narrative    Past Surgical History  Procedure Laterality Date  . Hemorrhoid surgery    . Laparoscopic liver cyst removal    . Ovarian cyst surgery      Family History  Problem Relation Age of Onset  . Heart disease Neg Hx   . Glaucoma Neg Hx     Allergies  Allergen Reactions  . Codeine     Extreme Vomiting  . Penicillins     Redness,  swelling and itching  . Tetracyclines & Related     Yeast Infection and Stomach problems    Current Outpatient Prescriptions on File Prior to Visit  Medication Sig Dispense Refill  . Ascorbic Acid (VITAMIN C) 1000 MG tablet Take 1,000 mg by mouth 3 (three) times a week.     Marland Kitchen aspirin 81 MG tablet Take 81 mg by mouth daily.    . citalopram (CELEXA) 10 MG tablet TAKE 1 TABLET (10 MG TOTAL) BY MOUTH DAILY. 90 tablet 1  . losartan-hydrochlorothiazide (HYZAAR) 50-12.5 MG per tablet Take 1 tablet by mouth daily. 90 tablet 1  . Multiple Vitamin (MULTIVITAMIN) capsule Take 1 capsule by mouth daily.     No current facility-administered medications on file prior to visit.    BP 128/80 mmHg  Pulse 67  Temp(Src) 98 F (36.7 C) (Oral)  Ht 5\' 6"  (1.676 m)  Wt 164 lb 3.2 oz (74.481 kg)  BMI 26.52 kg/m2  SpO2 98%       Objective:   Physical Exam  General  Mental Status- Alert. Orientation- Orientation x 4.   Skin General:- Normal. Moisture- Dry. Temperature- Warm.  HEENT Head- normal.  Neck Neck- Supple.  Heart Ausculation-RRR  Lungs Ausculation- Clear, even, unlabored bilaterlly.  Abdomen Palpation/Percussion: Palpation and Percussion of the abdomen reveal- faint suprapubicTender, No Rebound tenderness, No Rigidity(guarding), No Palpable abdominal masses and No jar tenderness.  Liver:-Normal. Spleen:- Normal. Other Characteristics- No Costovertebral angle tenderness- Left or Costovertebral angle tenderness- Right.  Auscultation: Auscultation of the abdomen reveals- Bowel Sounds normal.       Assessment & Plan:  You appear to have a urinary tract infection. I am prescribing  cipro antibiotic for the probable infection. Hydrate well. I am sending out a urine culture. During the interim if your signs and symptoms worsen rather than improving please notify us. We will notify your when the culture results are back.   Follow up in 7 days or as needed  Discussed with pt that  if she get yeast infection to let us know. Would give special advisement on holding citalopram briefly for 2 days while she take one tab diflucan.

## 2015-03-08 LAB — URINE CULTURE: Colony Count: 55000

## 2015-04-18 ENCOUNTER — Ambulatory Visit: Payer: Self-pay | Admitting: Family Medicine

## 2015-05-15 ENCOUNTER — Other Ambulatory Visit: Payer: Self-pay | Admitting: Family Medicine

## 2015-05-19 ENCOUNTER — Encounter: Payer: Self-pay | Admitting: Family Medicine

## 2015-05-19 ENCOUNTER — Ambulatory Visit (INDEPENDENT_AMBULATORY_CARE_PROVIDER_SITE_OTHER): Payer: Medicare Other | Admitting: Family Medicine

## 2015-05-19 VITALS — BP 134/71 | HR 71 | Temp 98.2°F | Ht 66.0 in | Wt 162.4 lb

## 2015-05-19 DIAGNOSIS — F4323 Adjustment disorder with mixed anxiety and depressed mood: Secondary | ICD-10-CM

## 2015-05-19 DIAGNOSIS — D229 Melanocytic nevi, unspecified: Secondary | ICD-10-CM

## 2015-05-19 DIAGNOSIS — J302 Other seasonal allergic rhinitis: Secondary | ICD-10-CM

## 2015-05-19 DIAGNOSIS — R8290 Unspecified abnormal findings in urine: Secondary | ICD-10-CM | POA: Diagnosis not present

## 2015-05-19 DIAGNOSIS — R5382 Chronic fatigue, unspecified: Secondary | ICD-10-CM

## 2015-05-19 DIAGNOSIS — I10 Essential (primary) hypertension: Secondary | ICD-10-CM | POA: Diagnosis not present

## 2015-05-19 DIAGNOSIS — Z1159 Encounter for screening for other viral diseases: Secondary | ICD-10-CM

## 2015-05-19 DIAGNOSIS — F411 Generalized anxiety disorder: Secondary | ICD-10-CM

## 2015-05-19 DIAGNOSIS — M8580 Other specified disorders of bone density and structure, unspecified site: Secondary | ICD-10-CM | POA: Diagnosis not present

## 2015-05-19 LAB — POCT URINALYSIS DIPSTICK
Bilirubin, UA: NEGATIVE
Blood, UA: NEGATIVE
GLUCOSE UA: NEGATIVE
KETONES UA: NEGATIVE
Nitrite, UA: NEGATIVE
Protein, UA: 0.2
SPEC GRAV UA: 1.025
UROBILINOGEN UA: NEGATIVE
pH, UA: 6

## 2015-05-19 LAB — HEPATITIS C ANTIBODY: HCV Ab: NEGATIVE

## 2015-05-19 MED ORDER — CITALOPRAM HYDROBROMIDE 10 MG PO TABS: ORAL_TABLET | ORAL | Status: DC

## 2015-05-19 MED ORDER — LORATADINE 10 MG PO CAPS: ORAL_CAPSULE | ORAL | Status: DC

## 2015-05-19 NOTE — Progress Notes (Signed)
Pre visit review using our clinic review tool, if applicable. No additional management support is needed unless otherwise documented below in the visit note. 

## 2015-05-19 NOTE — Assessment & Plan Note (Signed)
Stable con't celexa  

## 2015-05-19 NOTE — Patient Instructions (Signed)
Hypertension Hypertension, commonly called high blood pressure, is when the force of blood pumping through your arteries is too strong. Your arteries are the blood vessels that carry blood from your heart throughout your body. A blood pressure reading consists of a higher number over a lower number, such as 110/72. The higher number (systolic) is the pressure inside your arteries when your heart pumps. The lower number (diastolic) is the pressure inside your arteries when your heart relaxes. Ideally you want your blood pressure below 120/80. Hypertension forces your heart to work harder to pump blood. Your arteries may become narrow or stiff. Having untreated or uncontrolled hypertension can cause heart attack, stroke, kidney disease, and other problems. RISK FACTORS Some risk factors for high blood pressure are controllable. Others are not.  Risk factors you cannot control include:   Race. You may be at higher risk if you are African American.  Age. Risk increases with age.  Gender. Men are at higher risk than women before age 45 years. After age 65, women are at higher risk than men. Risk factors you can control include:  Not getting enough exercise or physical activity.  Being overweight.  Getting too much fat, sugar, calories, or salt in your diet.  Drinking too much alcohol. SIGNS AND SYMPTOMS Hypertension does not usually cause signs or symptoms. Extremely high blood pressure (hypertensive crisis) may cause headache, anxiety, shortness of breath, and nosebleed. DIAGNOSIS To check if you have hypertension, your health care provider will measure your blood pressure while you are seated, with your arm held at the level of your heart. It should be measured at least twice using the same arm. Certain conditions can cause a difference in blood pressure between your right and left arms. A blood pressure reading that is higher than normal on one occasion does not mean that you need treatment. If  it is not clear whether you have high blood pressure, you may be asked to return on a different day to have your blood pressure checked again. Or, you may be asked to monitor your blood pressure at home for 1 or more weeks. TREATMENT Treating high blood pressure includes making lifestyle changes and possibly taking medicine. Living a healthy lifestyle can help lower high blood pressure. You may need to change some of your habits. Lifestyle changes may include:  Following the DASH diet. This diet is high in fruits, vegetables, and whole grains. It is low in salt, red meat, and added sugars.  Keep your sodium intake below 2,300 mg per day.  Getting at least 30-45 minutes of aerobic exercise at least 4 times per week.  Losing weight if necessary.  Not smoking.  Limiting alcoholic beverages.  Learning ways to reduce stress. Your health care provider may prescribe medicine if lifestyle changes are not enough to get your blood pressure under control, and if one of the following is true:  You are 18-59 years of age and your systolic blood pressure is above 140.  You are 60 years of age or older, and your systolic blood pressure is above 150.  Your diastolic blood pressure is above 90.  You have diabetes, and your systolic blood pressure is over 140 or your diastolic blood pressure is over 90.  You have kidney disease and your blood pressure is above 140/90.  You have heart disease and your blood pressure is above 140/90. Your personal target blood pressure may vary depending on your medical conditions, your age, and other factors. HOME CARE INSTRUCTIONS    Have your blood pressure rechecked as directed by your health care provider.   Take medicines only as directed by your health care provider. Follow the directions carefully. Blood pressure medicines must be taken as prescribed. The medicine does not work as well when you skip doses. Skipping doses also puts you at risk for  problems.  Do not smoke.   Monitor your blood pressure at home as directed by your health care provider. SEEK MEDICAL CARE IF:   You think you are having a reaction to medicines taken.  You have recurrent headaches or feel dizzy.  You have swelling in your ankles.  You have trouble with your vision. SEEK IMMEDIATE MEDICAL CARE IF:  You develop a severe headache or confusion.  You have unusual weakness, numbness, or feel faint.  You have severe chest or abdominal pain.  You vomit repeatedly.  You have trouble breathing. MAKE SURE YOU:   Understand these instructions.  Will watch your condition.  Will get help right away if you are not doing well or get worse.   This information is not intended to replace advice given to you by your health care provider. Make sure you discuss any questions you have with your health care provider.   Document Released: 03/08/2005 Document Revised: 07/23/2014 Document Reviewed: 12/29/2012 Elsevier Interactive Patient Education 2016 Elsevier Inc.  

## 2015-05-19 NOTE — Progress Notes (Signed)
Patient ID: Kristen Schultz, female    DOB: May 13, 1945  Age: 70 y.o. MRN: [id number]    Subjective:  Subjective HPI Kristen Schultz presents c/o cough , fatigue .  She is a former smoker and c/o dry cough,  No weight loss.  She is also requesting a derm referral for a skin check.     Review of Systems  Constitutional: Positive for fatigue. Negative for diaphoresis, appetite change and unexpected weight change.  Eyes: Negative for pain, redness and visual disturbance.  Respiratory: Positive for cough. Negative for chest tightness, shortness of breath and wheezing.   Cardiovascular: Negative for chest pain, palpitations and leg swelling.  Endocrine: Negative for cold intolerance, heat intolerance, polydipsia, polyphagia and polyuria.  Genitourinary: Negative for dysuria, frequency and difficulty urinating.  Neurological: Negative for dizziness, light-headedness, numbness and headaches.    History Past Medical History  Diagnosis Date  . Hypertension   . Hyperlipidemia   . Anxiety     She has past surgical history that includes Hemorrhoid surgery; Laparoscopic liver cyst removal; and Ovarian cyst surgery.   Her family history is negative for Heart disease and Glaucoma.She reports that she has quit smoking. She does not have any smokeless tobacco history on file. She reports that she does not drink alcohol. Her drug history is not on file.  Current Outpatient Prescriptions on File Prior to Visit  Medication Sig Dispense Refill  . Ascorbic Acid (VITAMIN C) 1000 MG tablet Take 1,000 mg by mouth 3 (three) times a week.     Marland Kitchen aspirin 81 MG tablet Take 81 mg by mouth daily.    Marland Kitchen losartan-hydrochlorothiazide (HYZAAR) 50-12.5 MG tablet TAKE 1 TABLET BY MOUTH DAILY. 90 tablet 1  . Multiple Vitamin (MULTIVITAMIN) capsule Take 1 capsule by mouth daily.     No current facility-administered medications on file prior to visit.     Objective:  Objective Physical Exam  Constitutional: She is  oriented to person, place, and time. She appears well-developed and well-nourished.  HENT:  Head: Normocephalic and atraumatic.  Eyes: Conjunctivae and EOM are normal.  Neck: Normal range of motion. Neck supple. No JVD present. Carotid bruit is not present. No thyromegaly present.  Cardiovascular: Normal rate, regular rhythm and normal heart sounds.   No murmur heard. Pulmonary/Chest: Effort normal and breath sounds normal. No respiratory distress. She has no wheezes. She has no rales. She exhibits no tenderness.  Musculoskeletal: She exhibits no edema.  Neurological: She is alert and oriented to person, place, and time.  Psychiatric: She has a normal mood and affect.  Nursing note and vitals reviewed.  BP 134/71 mmHg  Pulse 71  Temp(Src) 98.2 F (36.8 C) (Oral)  Ht _0  (1.676 m)  Wt 162 lb 6.4 oz (73.664 kg)  BMI 26.22 kg/m2  SpO2 98% Wt Readings from Last 3 Encounters:  05/19/15 162 lb 6.4 oz (73.664 kg)  03/06/15 164 lb 3.2 oz (74.481 kg)  11/11/14 162 lb (73.483 kg)     Lab Results  Component Value Date   GLUCOSE 84 11/11/2014   CHOL 228* 11/11/2014   TRIG 106.0 11/11/2014   HDL 77.00 11/11/2014   LDLCALC 130* 11/11/2014   ALT 13 11/11/2014   AST 23 11/11/2014   NA 137 11/11/2014   K 4.1 11/11/2014   CL 98 11/11/2014   CREATININE 0.64 11/11/2014   BUN 15 11/11/2014   CO2 32 11/11/2014    Dg Bone Density  02/11/2015  EXAM: DUAL X-RAY ABSORPTIOMETRY (DXA)  FOR BONE MINERAL DENSITY IMPRESSION: Patient: Kristen Schultz   Referring Physician: Garnet Koyanagi MD Birth Date: [birth year] [age] Patient ID: [id number] Height: 65.0 in. Weight: 165.0 lbs. Measured: 02/11/2015 10:35:03 AM (16 SP 2) Sex: Female Ethnicity: White Analyzed: 02/11/2015 10:39:15 AM (16 SP 2) FRAX* 10-year Probability of Fracture Based on femoral neck BMD: DualFemur (Left) Major Osteoporotic Fracture: 10.7% Hip Fracture:                1.7% Population:                  [place] (Caucasian) Risk  Factors:                Secondary Osteoporosis *FRAX is a [place] [employer] for Metabolic Bone Disease, a [place] (WHO) [facility]. Referring Physician:  Garnet Koyanagi MD PATIENT: Name: Kristen Schultz Patient ID: [id number] Birth Date: [birth year] Height: 65.0 in. Sex: Female Measured: 02/11/2015 Weight: 165.0 lbs. Indications: Caucasian, Estrogen Deficient, Low Calcium Intake (269.3), Postmenopausal, Secondary Osteoporosis Fractures: Treatments: ASSESSMENT: The BMD measured at Femur Neck Left is 0.791 g/cm2 with a T-score of -1.8. This patient is considered to be osteopenic according to [place] [facility]) criteria. L-1 was excluded due to degenerative changes. There has been a statistically significant increase in BMD of Lumbar spine and left hip since prior exam dated 07/29/2011. Site Region Measured Date Measured Age YA BMD Significant CHANGE T-score DualFemur Neck Left 02/11/2015    68.9         -1.8    0.791 g/cm2 * AP Spine  L2-L4     02/11/2015    68.9         -0.8    1.121 g/cm2 * World Health Organization [facility]) criteria for post-menopausal, Caucasian Women: Normal       T-score at or above -1 SD Osteopenia   T-score between -1 and -2.5 SD Osteoporosis T-score at or below -2.5 SD RECOMMENDATION: [place] recommends that FDA-approved medical therapies be considered in postmenopausal women and men age 75 or older with a: 1. Hip or vertebral (clinical or morphometric) fracture. 2. T-score of <-2.5 at the spine or hip. 3. Ten-year fracture probability by FRAX of 3% or greater for hip fracture or 20% or greater for major osteoporotic fracture. All treatment decisions require clinical judgment and consideration of individual patient factors, including patient preferences, co-morbidities, previous drug use, risk factors not captured in the FRAX model (e.g. falls, vitamin D deficiency, increased bone turnover,  interval significant decline in bone density) and possible under - or over-estimation of fracture risk by FRAX. All patients should ensure an adequate intake of dietary calcium (1200 mg/d) and vitamin D (800 IU daily) unless contraindicated. FOLLOW-UP: People with diagnosed cases of osteoporosis or at high risk for fracture should have regular bone mineral density tests. For patients eligible for Medicare, routine testing is allowed once every 2 years. The testing frequency can be increased to one year for patients who have rapidly progressing disease, those who are receiving or discontinuing medical therapy to restore bone mass, or have additional risk factors. I have reviewed this report, and agree with the above findings. 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 ASSESSMENT: The probability of a major osteoporitic fracture is 10.7 % within the next ten years. The probability of a hip fracture is 1.7 % within the next ten years. Electronically Signed   By: Lajean Manes M.D.   On: 02/11/2015 10:54     Assessment & Plan:  Plan I have discontinued Ms. Kroon's ciprofloxacin. I am also having her start on Loratadine. Additionally, I am having her maintain her multivitamin, aspirin, vitamin C, losartan-hydrochlorothiazide, and citalopram.  Meds ordered this encounter  Medications  . citalopram (CELEXA) 10 MG tablet    Sig: TAKE 1 TABLET (10 MG TOTAL) BY MOUTH DAILY.    Dispense:  90 tablet    Refill:  3  . Loratadine 10 MG CAPS    Sig: 1 po qd    Dispense:  30 each    Problem List Items Addressed This Visit    None      Visit Diagnoses    Essential hypertension    -  Primary    Relevant Orders    POCT urinalysis dipstick (Completed)    Lipid panel    CBC with Differential/Platelet    Comp Met (CMET)    Thyroid Panel With TSH    Vitamin B12    Vitamin D 1,25 dihydroxy    Adjustment disorder with mixed anxiety and depressed mood        Relevant Medications    citalopram (CELEXA) 10 MG tablet    Chronic fatigue        Relevant Orders    Thyroid Panel With TSH    Vitamin B12    Vitamin D 1,25 dihydroxy    Need for hepatitis C screening test        Relevant Orders    Hepatitis C antibody    Seasonal allergies        Relevant Medications    Loratadine 10 MG CAPS    Abnormal urine        Relevant Orders    Urine Culture    Suspicious nevus        Relevant Orders    Ambulatory referral to Dermatology       Follow-up: Return in about 6 months (around 11/16/2015), or if symptoms worsen or fail to improve, for hypertension, annual exam, fasting.  Garnet Koyanagi, DO

## 2015-05-19 NOTE — Assessment & Plan Note (Signed)
con't losartan Stable

## 2015-05-20 LAB — CBC WITH DIFFERENTIAL/PLATELET
BASOS ABS: 0 10*3/uL (ref 0.0–0.1)
BASOS PCT: 0.4 % (ref 0.0–3.0)
EOS ABS: 0.2 10*3/uL (ref 0.0–0.7)
Eosinophils Relative: 2.5 % (ref 0.0–5.0)
HEMATOCRIT: 40.7 % (ref 36.0–46.0)
HEMOGLOBIN: 13.8 g/dL (ref 12.0–15.0)
LYMPHS PCT: 15.5 % (ref 12.0–46.0)
Lymphs Abs: 1.5 10*3/uL (ref 0.7–4.0)
MCHC: 34 g/dL (ref 30.0–36.0)
MCV: 91.7 fl (ref 78.0–100.0)
MONOS PCT: 1 % — AB (ref 3.0–12.0)
Monocytes Absolute: 0.1 10*3/uL (ref 0.1–1.0)
NEUTROS ABS: 7.7 10*3/uL (ref 1.4–7.7)
Neutrophils Relative %: 80.6 % — ABNORMAL HIGH (ref 43.0–77.0)
Platelets: 265 10*3/uL (ref 150.0–400.0)
RBC: 4.44 Mil/uL (ref 3.87–5.11)
RDW: 14.2 % (ref 11.5–15.5)
WBC: 9.6 10*3/uL (ref 4.0–10.5)

## 2015-05-20 LAB — COMPREHENSIVE METABOLIC PANEL
ALT: 15 U/L (ref 0–35)
AST: 19 U/L (ref 0–37)
Albumin: 4 g/dL (ref 3.5–5.2)
Alkaline Phosphatase: 51 U/L (ref 39–117)
BUN: 19 mg/dL (ref 6–23)
CALCIUM: 9.5 mg/dL (ref 8.4–10.5)
CHLORIDE: 102 meq/L (ref 96–112)
CO2: 31 meq/L (ref 19–32)
CREATININE: 0.78 mg/dL (ref 0.40–1.20)
GFR: 77.79 mL/min (ref >60.00)
Glucose, Bld: 93 mg/dL (ref 70–99)
Potassium: 3.6 mEq/L (ref 3.5–5.1)
Sodium: 138 mEq/L (ref 135–145)
Total Bilirubin: 0.4 mg/dL (ref 0.2–1.2)
Total Protein: 7 g/dL (ref 6.0–8.3)

## 2015-05-20 LAB — THYROID PANEL WITH TSH
FREE THYROXINE INDEX: 1.8 (ref 1.4–3.8)
T3 UPTAKE: 34 % (ref 22–35)
T4 TOTAL: 5.4 ug/dL (ref 4.5–12.0)
TSH: 1.4 m[IU]/L

## 2015-05-20 LAB — LIPID PANEL
CHOL/HDL RATIO: 3
Cholesterol: 201 mg/dL — ABNORMAL HIGH (ref 0–200)
HDL: 73.3 mg/dL (ref >39.00)
LDL CALC: 108 mg/dL — AB (ref 0–99)
NonHDL: 127.24
TRIGLYCERIDES: 95 mg/dL (ref 0.0–149.0)
VLDL: 19 mg/dL (ref 0.0–40.0)

## 2015-05-20 LAB — VITAMIN B12: VITAMIN B 12: 192 pg/mL — AB (ref 211–911)

## 2015-05-20 LAB — URINE CULTURE
Colony Count: NO GROWTH
Organism ID, Bacteria: NO GROWTH

## 2015-05-22 LAB — VITAMIN D 1,25 DIHYDROXY
VITAMIN D 1, 25 (OH) TOTAL: 51 pg/mL (ref 18–72)
Vitamin D2 1, 25 (OH)2: <8 pg/mL
Vitamin D3 1, 25 (OH)2: 51 pg/mL

## 2015-05-27 ENCOUNTER — Other Ambulatory Visit: Payer: Self-pay | Admitting: Family Medicine

## 2015-05-27 DIAGNOSIS — E538 Deficiency of other specified B group vitamins: Secondary | ICD-10-CM

## 2015-05-27 NOTE — Telephone Encounter (Signed)
Labs entered, appt. Made and nurse visit scheduled for vitamin B12 injection per lab results instructions dated 05/19/2015

## 2015-05-28 ENCOUNTER — Ambulatory Visit (INDEPENDENT_AMBULATORY_CARE_PROVIDER_SITE_OTHER): Payer: Medicare Other | Admitting: *Deleted

## 2015-05-28 DIAGNOSIS — E538 Deficiency of other specified B group vitamins: Secondary | ICD-10-CM

## 2015-05-28 MED ORDER — CYANOCOBALAMIN 1000 MCG/ML IJ SOLN
1000.0000 ug | Freq: Once | INTRAMUSCULAR | Status: AC
  Administered 2015-05-28: 1000 ug via INTRAMUSCULAR

## 2015-05-28 NOTE — Progress Notes (Signed)
Pre visit review using our clinic review tool, if applicable. No additional management support is needed unless otherwise documented below in the visit note.  Pt tolerated injection well.   Next appt: 06/04/15  Dorrene German, RN

## 2015-05-29 DIAGNOSIS — L820 Inflamed seborrheic keratosis: Secondary | ICD-10-CM | POA: Diagnosis not present

## 2015-05-29 DIAGNOSIS — L710 Perioral dermatitis: Secondary | ICD-10-CM | POA: Diagnosis not present

## 2015-06-04 ENCOUNTER — Ambulatory Visit (INDEPENDENT_AMBULATORY_CARE_PROVIDER_SITE_OTHER): Payer: Medicare Other | Admitting: *Deleted

## 2015-06-04 DIAGNOSIS — E538 Deficiency of other specified B group vitamins: Secondary | ICD-10-CM | POA: Diagnosis not present

## 2015-06-04 MED ORDER — CYANOCOBALAMIN 1000 MCG/ML IJ SOLN
1000.0000 ug | Freq: Once | INTRAMUSCULAR | Status: AC
  Administered 2015-06-04: 1000 ug via INTRAMUSCULAR

## 2015-06-04 NOTE — Progress Notes (Signed)
Pre visit review using our clinic review tool, if applicable. No additional management support is needed unless otherwise documented below in the visit note.  Pt tolerated injection well.   Kilynn Fitzsimmons J Dane Bloch, RN  

## 2015-06-10 ENCOUNTER — Ambulatory Visit (INDEPENDENT_AMBULATORY_CARE_PROVIDER_SITE_OTHER): Payer: Medicare Other | Admitting: Behavioral Health

## 2015-06-10 ENCOUNTER — Telehealth: Payer: Self-pay | Admitting: Family Medicine

## 2015-06-10 DIAGNOSIS — E538 Deficiency of other specified B group vitamins: Secondary | ICD-10-CM | POA: Diagnosis not present

## 2015-06-10 MED ORDER — CYANOCOBALAMIN 1000 MCG/ML IJ SOLN
1000.0000 ug | Freq: Once | INTRAMUSCULAR | Status: AC
  Administered 2015-06-10: 1000 ug via INTRAMUSCULAR

## 2015-06-10 NOTE — Progress Notes (Signed)
Pre visit review using our clinic review tool, if applicable. No additional management support is needed unless otherwise documented below in the visit note.  Patient in office today for B12 injection. IM given in Left Deltoid. Patient tolerated injection well. Next appointment has already been scheduled for 06/18/15 at 9:45 AM.

## 2015-06-10 NOTE — Telephone Encounter (Signed)
Pt dropped off copy of Power of [place] for use and disclosure of Protected Health Information for Provider to look at and have it on chart.

## 2015-06-11 ENCOUNTER — Ambulatory Visit: Payer: Medicare Other

## 2015-06-11 NOTE — Telephone Encounter (Signed)
Documents received and forwarded to Jackie for scan to chart/SLS 03/22 

## 2015-06-18 ENCOUNTER — Ambulatory Visit (INDEPENDENT_AMBULATORY_CARE_PROVIDER_SITE_OTHER): Payer: Medicare Other

## 2015-06-18 DIAGNOSIS — E538 Deficiency of other specified B group vitamins: Secondary | ICD-10-CM

## 2015-06-18 MED ORDER — CYANOCOBALAMIN 1000 MCG/ML IJ SOLN
1000.0000 ug | Freq: Once | INTRAMUSCULAR | Status: AC
  Administered 2015-06-18: 1000 ug via INTRAMUSCULAR

## 2015-06-18 NOTE — Progress Notes (Signed)
Pre visit review using our clinic review tool, if applicable. No additional management support is needed unless otherwise documented below in the visit note.  Patient in for B12 injection ordered by Dr, Etter Sjogren. States this will  be her final weekly injection and will return for lab work on next appointment to determine B 12 status.   Given IM Right deltoid, patient tolerated well.  Patient to reschedule next appointment.

## 2015-06-27 ENCOUNTER — Other Ambulatory Visit (INDEPENDENT_AMBULATORY_CARE_PROVIDER_SITE_OTHER): Payer: Medicare Other

## 2015-06-27 DIAGNOSIS — E538 Deficiency of other specified B group vitamins: Secondary | ICD-10-CM

## 2015-06-27 LAB — VITAMIN B12: Vitamin B-12: 528 pg/mL (ref 200–1100)

## 2015-06-27 NOTE — Addendum Note (Signed)
Addended by: Peggyann Shoals on: 06/27/2015 03:31 PM   Modules accepted: Orders

## 2015-08-26 ENCOUNTER — Encounter: Payer: Self-pay | Admitting: Family Medicine

## 2015-08-26 ENCOUNTER — Ambulatory Visit (INDEPENDENT_AMBULATORY_CARE_PROVIDER_SITE_OTHER): Payer: Medicare Other | Admitting: Family Medicine

## 2015-08-26 VITALS — BP 122/74 | HR 70 | Temp 98.2°F | Wt 163.6 lb

## 2015-08-26 DIAGNOSIS — L259 Unspecified contact dermatitis, unspecified cause: Secondary | ICD-10-CM | POA: Diagnosis not present

## 2015-08-26 MED ORDER — METHYLPREDNISOLONE ACETATE 80 MG/ML IJ SUSP
80.0000 mg | Freq: Once | INTRAMUSCULAR | Status: AC
  Administered 2015-08-26: 80 mg via INTRAMUSCULAR

## 2015-08-26 MED ORDER — PREDNISONE 10 MG PO TABS: ORAL_TABLET | ORAL | Status: DC

## 2015-08-26 MED FILL — predniSONE 10 MG TABS: 10 | 9 days supply | Qty: 18 | Fill #0

## 2015-08-26 NOTE — Progress Notes (Signed)
Patient ID: Kristen Schultz, female    DOB: 11/30/1945  Age: 70 y.o. MRN: [id number]    Subjective:  Subjective HPI Kristen Schultz presents for poison ivy x 2 weeks.  Pt used otc with no relief.    Review of Systems  Constitutional: Negative for diaphoresis, appetite change, fatigue and unexpected weight change.  Eyes: Negative for pain, redness and visual disturbance.  Respiratory: Negative for cough, chest tightness, shortness of breath and wheezing.   Cardiovascular: Negative for chest pain, palpitations and leg swelling.  Endocrine: Negative for cold intolerance, heat intolerance, polydipsia, polyphagia and polyuria.  Genitourinary: Negative for dysuria, frequency and difficulty urinating.  Skin: Positive for rash. Negative for color change.  Neurological: Negative for dizziness, light-headedness, numbness and headaches.    History Past Medical History  Diagnosis Date  . Hypertension   . Hyperlipidemia   . Anxiety     She has past surgical history that includes Hemorrhoid surgery; Laparoscopic liver cyst removal; and Ovarian cyst surgery.   Her family history is negative for Heart disease and Glaucoma.She reports that she has quit smoking. She does not have any smokeless tobacco history on file. She reports that she does not drink alcohol. Her drug history is not on file.  Current Outpatient Prescriptions on File Prior to Visit  Medication Sig Dispense Refill  . Ascorbic Acid (VITAMIN C) 1000 MG tablet Take 1,000 mg by mouth 3 (three) times a week.     Marland Kitchen aspirin 81 MG tablet Take 81 mg by mouth daily.    . citalopram (CELEXA) 10 MG tablet TAKE 1 TABLET (10 MG TOTAL) BY MOUTH DAILY. 90 tablet 3  . Loratadine 10 MG CAPS 1 po qd (Patient taking differently: Take 1 capsule by mouth as needed. ) 30 each   . losartan-hydrochlorothiazide (HYZAAR) 50-12.5 MG tablet TAKE 1 TABLET BY MOUTH DAILY. 90 tablet 1  . Multiple Vitamin (MULTIVITAMIN) capsule Take 1 capsule by mouth daily.      No current facility-administered medications on file prior to visit.     Objective:  Objective Physical Exam  Constitutional: She is oriented to person, place, and time. She appears well-developed and well-nourished.  HENT:  Head: Normocephalic and atraumatic.  Eyes: Conjunctivae and EOM are normal.  Neck: Normal range of motion. Neck supple. No JVD present. Carotid bruit is not present. No thyromegaly present.  Cardiovascular: Normal rate, regular rhythm and normal heart sounds.   No murmur heard. Pulmonary/Chest: Effort normal and breath sounds normal. No respiratory distress. She has no wheezes. She has no rales. She exhibits no tenderness.  Musculoskeletal: She exhibits no edema.  Neurological: She is alert and oriented to person, place, and time.  Skin: Rash noted.     Psychiatric: She has a normal mood and affect.  Nursing note and vitals reviewed.  BP 122/74 mmHg  Pulse 70  Temp(Src) 98.2 F (36.8 C) (Oral)  Wt 163 lb 9.6 oz (74.208 kg)  SpO2 98% Wt Readings from Last 3 Encounters:  08/26/15 163 lb 9.6 oz (74.208 kg)  05/19/15 162 lb 6.4 oz (73.664 kg)  03/06/15 164 lb 3.2 oz (74.481 kg)     Lab Results  Component Value Date   WBC 9.6 05/19/2015   HGB 13.8 05/19/2015   HCT 40.7 05/19/2015   PLT 265.0 05/19/2015   GLUCOSE 93 05/19/2015   CHOL 201* 05/19/2015   TRIG 95.0 05/19/2015   HDL 73.30 05/19/2015   LDLCALC 108* 05/19/2015   ALT 15 05/19/2015  AST 19 05/19/2015   NA 138 05/19/2015   K 3.6 05/19/2015   CL 102 05/19/2015   CREATININE 0.78 05/19/2015   BUN 19 05/19/2015   CO2 31 05/19/2015   TSH 1.40 05/19/2015    Dg Bone Density  02/11/2015  EXAM: DUAL X-RAY ABSORPTIOMETRY (DXA) FOR BONE MINERAL DENSITY IMPRESSION: Patient: Kristen Schultz   Referring Physician: Garnet Koyanagi MD Birth Date: [birth year] [age] Patient ID: [id number] Height: 65.0 in. Weight: 165.0 lbs. Measured: 02/11/2015 10:35:03 AM (16 SP 2) Sex: Female Ethnicity:  White Analyzed: 02/11/2015 10:39:15 AM (16 SP 2) FRAX* 10-year Probability of Fracture Based on femoral neck BMD: DualFemur (Left) Major Osteoporotic Fracture: 10.7% Hip Fracture:                1.7% Population:                  [place] (Caucasian) Risk Factors:                Secondary Osteoporosis *FRAX is a [place] [employer] for Metabolic Bone Disease, a [place] (WHO) [facility]. Referring Physician:  Garnet Koyanagi MD PATIENT: Name: Kristen Schultz Patient ID: [id number] Birth Date: 12-16-1945 Height: 65.0 in. Sex: Female Measured: 02/11/2015 Weight: 165.0 lbs. Indications: Caucasian, Estrogen Deficient, Low Calcium Intake (269.3), Postmenopausal, Secondary Osteoporosis Fractures: Treatments: ASSESSMENT: The BMD measured at Femur Neck Left is 0.791 g/cm2 with a T-score of -1.8. This patient is considered to be osteopenic according to [facility]) criteria. L-1 was excluded due to degenerative changes. There has been a statistically significant increase in BMD of Lumbar spine and left hip since prior exam dated 07/29/2011. Site Region Measured Date Measured Age YA BMD Significant CHANGE T-score DualFemur Neck Left 02/11/2015    68.9         -1.8    0.791 g/cm2 * AP Spine  L2-L4     02/11/2015    68.9         -0.8    1.121 g/cm2 * World Health Organization [facility]) criteria for post-menopausal, Caucasian Women: Normal       T-score at or above -1 SD Osteopenia   T-score between -1 and -2.5 SD Osteoporosis T-score at or below -2.5 SD RECOMMENDATION: [place] recommends that FDA-approved medical therapies be considered in postmenopausal women and men age 38 or older with a: 1. Hip or vertebral (clinical or morphometric) fracture. 2. T-score of <-2.5 at the spine or hip. 3. Ten-year fracture probability by FRAX of 3% or greater for hip fracture or 20% or greater for major osteoporotic fracture. All treatment  decisions require clinical judgment and consideration of individual patient factors, including patient preferences, co-morbidities, previous drug use, risk factors not captured in the FRAX model (e.g. falls, vitamin D deficiency, increased bone turnover, interval significant decline in bone density) and possible under - or over-estimation of fracture risk by FRAX. All patients should ensure an adequate intake of dietary calcium (1200 mg/d) and vitamin D (800 IU daily) unless contraindicated. FOLLOW-UP: People with diagnosed cases of osteoporosis or at high risk for fracture should have regular bone mineral density tests. For patients eligible for Medicare, routine testing is allowed once every 2 years. The testing frequency can be increased to one year for patients who have rapidly progressing disease, those who are receiving or discontinuing medical therapy to restore bone mass, or have additional risk factors. I have  reviewed this report, and agree with the above findings. 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 ASSESSMENT: The probability of a major osteoporitic fracture is 10.7 % within the next ten years. The probability of a hip fracture is 1.7 % within the next ten years. Electronically Signed   By: Lajean Manes M.D.   On: 02/11/2015 10:54     Assessment & Plan:  Plan I am having Ms. Lookingbill start on predniSONE. I am also having her maintain her multivitamin, aspirin, vitamin C, losartan-hydrochlorothiazide, citalopram, and Loratadine. We administered methylPREDNISolone acetate.  Meds ordered this encounter  Medications   . predniSONE (DELTASONE) 10 MG tablet    Sig: 3 po qd for 3 days then 2 po qd for 3 days the 1 po qd for 3 days    Dispense:  18 tablet    Refill:  0  . methylPREDNISolone acetate (DEPO-MEDROL) injection 80 mg    Sig:     Problem List Items Addressed This Visit    None    Visit Diagnoses    Contact dermatitis    -  Primary    Relevant Medications    predniSONE (DELTASONE) 10 MG tablet    methylPREDNISolone acetate (DEPO-MEDROL) injection 80 mg (Completed)       Follow-up: Return if symptoms worsen or fail to improve.  [name], DO

## 2015-08-26 NOTE — Progress Notes (Signed)
Pre visit review using our clinic review tool, if applicable. No additional management support is needed unless otherwise documented below in the visit note. 

## 2015-08-26 NOTE — Patient Instructions (Signed)
Contact Dermatitis Dermatitis is redness, soreness, and swelling (inflammation) of the skin. Contact dermatitis is a reaction to certain substances that touch the skin. There are two types of contact dermatitis:   Irritant contact dermatitis. This type is caused by something that irritates your skin, such as dry hands from washing them too much. This type does not require previous exposure to the substance for a reaction to occur. This type is more common.  Allergic contact dermatitis. This type is caused by a substance that you are allergic to, such as a nickel allergy or poison ivy. This type only occurs if you have been exposed to the substance (allergen) before. Upon a repeat exposure, your body reacts to the substance. This type is less common. CAUSES  Many different substances can cause contact dermatitis. Irritant contact dermatitis is most commonly caused by exposure to:   Makeup.   Soaps.   Detergents.   Bleaches.   Acids.   Metal salts, such as nickel.  Allergic contact dermatitis is most commonly caused by exposure to:   Poisonous plants.   Chemicals.   Jewelry.   Latex.   Medicines.   Preservatives in products, such as clothing.  RISK FACTORS This condition is more likely to develop in:   People who have jobs that expose them to irritants or allergens.  People who have certain medical conditions, such as asthma or eczema.  SYMPTOMS  Symptoms of this condition may occur anywhere on your body where the irritant has touched you or is touched by you. Symptoms include:  Dryness or flaking.   Redness.   Cracks.   Itching.   Pain or a burning feeling.   Blisters.  Drainage of small amounts of blood or clear fluid from skin cracks. With allergic contact dermatitis, there may also be swelling in areas such as the eyelids, mouth, or genitals.  DIAGNOSIS  This condition is diagnosed with a medical history and physical exam. A patch skin test  may be performed to help determine the cause. If the condition is related to your job, you may need to see an occupational medicine specialist. TREATMENT Treatment for this condition includes figuring out what caused the reaction and protecting your skin from further contact. Treatment may also include:   Steroid creams or ointments. Oral steroid medicines may be needed in more severe cases.  Antibiotics or antibacterial ointments, if a skin infection is present.  Antihistamine lotion or an antihistamine taken by mouth to ease itching.  A bandage (dressing). HOME CARE INSTRUCTIONS Skin Care  Moisturize your skin as needed.   Apply cool compresses to the affected areas.  Try taking a bath with:  Epsom salts. Follow the instructions on the packaging. You can get these at your local pharmacy or grocery store.  Baking soda. Pour a small amount into the bath as directed by your health care provider.  Colloidal oatmeal. Follow the instructions on the packaging. You can get this at your local pharmacy or grocery store.  Try applying baking soda paste to your skin. Stir water into baking soda until it reaches a paste-like consistency.  Do not scratch your skin.  Bathe less frequently, such as every other day.  Bathe in lukewarm water. Avoid using hot water. Medicines  Take or apply over-the-counter and prescription medicines only as told by your health care provider.   If you were prescribed an antibiotic medicine, take or apply your antibiotic as told by your health care provider. Do not stop using the   antibiotic even if your condition starts to improve. General Instructions  Keep all follow-up visits as told by your health care provider. This is important.  Avoid the substance that caused your reaction. If you do not know what caused it, keep a journal to try to track what caused it. Write down:  What you eat.  What cosmetic products you use.  What you drink.  What  you wear in the affected area. This includes jewelry.  If you were given a dressing, take care of it as told by your health care provider. This includes when to change and remove it. SEEK MEDICAL CARE IF:   Your condition does not improve with treatment.  Your condition gets worse.  You have signs of infection such as swelling, tenderness, redness, soreness, or warmth in the affected area.  You have a fever.  You have new symptoms. SEEK IMMEDIATE MEDICAL CARE IF:   You have a severe headache, neck pain, or neck stiffness.  You vomit.  You feel very sleepy.  You notice red streaks coming from the affected area.  Your bone or joint underneath the affected area becomes painful after the skin has healed.  The affected area turns darker.  You have difficulty breathing.   This information is not intended to replace advice given to you by your health care provider. Make sure you discuss any questions you have with your health care provider.   Document Released: 03/05/2000 Document Revised: 11/27/2014 Document Reviewed: 07/24/2014 Elsevier Interactive Patient Education 2016 Elsevier Inc.  

## 2015-09-25 ENCOUNTER — Ambulatory Visit (INDEPENDENT_AMBULATORY_CARE_PROVIDER_SITE_OTHER): Payer: Medicare Other | Admitting: Medical

## 2015-09-25 ENCOUNTER — Ambulatory Visit (HOSPITAL_BASED_OUTPATIENT_CLINIC_OR_DEPARTMENT_OTHER)
Admission: RE | Admit: 2015-09-25 | Discharge: 2015-09-25 | Disposition: A | Discharge status: Home / Self Care | Payer: Medicare Other | Source: Ambulatory Visit | Attending: Medical | Admitting: Medical

## 2015-09-25 ENCOUNTER — Encounter: Payer: Self-pay | Admitting: Medical

## 2015-09-25 VITALS — BP 120/70 | HR 72 | Temp 98.1°F | Ht 66.0 in | Wt 158.8 lb

## 2015-09-25 DIAGNOSIS — M25511 Pain in right shoulder: Secondary | ICD-10-CM | POA: Diagnosis not present

## 2015-09-25 DIAGNOSIS — S4991XA Unspecified injury of right shoulder and upper arm, initial encounter: Secondary | ICD-10-CM | POA: Diagnosis not present

## 2015-09-25 MED ORDER — DICLOFENAC SODIUM 75 MG PO TBEC: 75.0000 mg | DELAYED_RELEASE_TABLET | Freq: Two times a day (BID) | ORAL | Status: DC

## 2015-09-25 MED FILL — DICLOFENAC SOD EC 75 MG TAB: 75 | 15 days supply | Qty: 30 | Fill #0

## 2015-09-25 NOTE — Progress Notes (Signed)
Pre visit review using our clinic review tool, if applicable. No additional management support is needed unless otherwise documented below in the visit note. 

## 2015-09-25 NOTE — Progress Notes (Signed)
Quick Note:  Pt has seen results on MyChart and message also sent for patient to call back if any questions. ______ 

## 2015-09-25 NOTE — Progress Notes (Signed)
Subjective:    Patient ID: Kristen Schultz, female    DOB: Jun 04, 1945, 70 y.o.   MRN: [id number]  HPI  Pt in with some rt shoulder pain.   Pt reports 25 yr ago had episode of frozen shoulder. She also report 9 months ago fell on her rt shoulder.  But recently had reduced range of motion for 4-5 weeks. When she left arm, rotates shoulder and if puts arm behind back will feel pop sensation front of shoulder. Pt is not taking any medications. Pt has no orthopedist.   Review of Systems  Constitutional: Negative for fever, chills and fatigue.  Respiratory: Negative for cough, chest tightness, shortness of breath and wheezing.   Cardiovascular: Negative for chest pain and palpitations.  Gastrointestinal: Negative for abdominal pain.  Musculoskeletal:       Rt shoulder pain.  Hematological: Negative for adenopathy. Does not bruise/bleed easily.  Psychiatric/Behavioral: Negative for behavioral problems and confusion.    Past Medical History  Diagnosis Date  . Hypertension   . Hyperlipidemia   . Anxiety      Social History   Social History  . Marital Status: Married    Spouse Name: N/A  . Number of Children: N/A  . Years of Education: N/A   Occupational History  . Not on file.   Social History Main Topics  . Smoking status: Former Research scientist (life sciences)  . Smokeless tobacco: Not on file  . Alcohol Use: No  . Drug Use: Not on file  . Sexual Activity: Not on file   Other Topics Concern  . Not on file   Social History Narrative    Past Surgical History  Procedure Laterality Date  . Hemorrhoid surgery    . Laparoscopic liver cyst removal    . Ovarian cyst surgery      Family History  Problem Relation Age of Onset  . Heart disease Neg Hx   . Glaucoma Neg Hx     Allergies  Allergen Reactions  . Codeine     Extreme Vomiting  . Penicillins     Redness, swelling and itching  . Tetracyclines & Related     Yeast Infection and Stomach problems    Current Outpatient  Prescriptions on File Prior to Visit  Medication Sig Dispense Refill  . aspirin 81 MG tablet Take 81 mg by mouth daily.    . citalopram (CELEXA) 10 MG tablet TAKE 1 TABLET (10 MG TOTAL) BY MOUTH DAILY. 90 tablet 3  . Loratadine 10 MG CAPS 1 po qd (Patient taking differently: Take 1 capsule by mouth as needed. ) 30 each   . losartan-hydrochlorothiazide (HYZAAR) 50-12.5 MG tablet TAKE 1 TABLET BY MOUTH DAILY. 90 tablet 1  . Multiple Vitamin (MULTIVITAMIN) capsule Take 1 capsule by mouth daily.     No current facility-administered medications on file prior to visit.    BP 120/70 mmHg  Pulse 72  Temp(Src) 98.1 F (36.7 C) (Oral)  Ht 5\' 6"  (1.676 m)  Wt 158 lb 12.8 oz (72.031 kg)  BMI 25.64 kg/m2  SpO2 98%      Objective:   Physical Exam  General- No acute distress. Pleasant patient. Lungs- Clear, even and unlabored. Heart- regular rate and rhythm. Neurologic- CNII- XII grossly intact.  Rt shoulder- on ROM has pain anterior aspect. Pain in shoulder upon attempting to lift arm. Pain as well in anterior aspect when demonstrates putting arm behind her back. Some pain on palpation anterior aspect bicep tendon area. And lateral asepct  pain on movement       Assessment & Plan:  For your rt shoulder pain will rx diclofenac. Try to do ROM exercises as tolerated. Will get xray today and refer you to sports medicine.   If any delay in getting information on appointment then call Anderson Malta in our office for update.  Follow up in 7 days or as needed.  Dawsyn Zurn, Percell Miller, PA-C

## 2015-09-25 NOTE — Patient Instructions (Addendum)
For your rt shoulder pain will rx diclofenac. Try to do ROM exercises as tolerated. Will get xray today and refer you to sports medicine.   If any delay in getting information on appointment  then call Anderson Malta in our office for update.  Follow up in 7 days or as needed.

## 2015-09-25 NOTE — Addendum Note (Signed)
Addended by: Tasia Catchings on: 09/25/2015 07:54 PM   Modules accepted: Miquel Dunn

## 2015-09-30 ENCOUNTER — Ambulatory Visit (INDEPENDENT_AMBULATORY_CARE_PROVIDER_SITE_OTHER): Payer: Medicare Other | Admitting: Physician Assistant

## 2015-09-30 ENCOUNTER — Encounter: Payer: Self-pay | Admitting: Physician Assistant

## 2015-09-30 VITALS — BP 132/76 | HR 63 | Temp 98.1°F | Resp 16 | Ht 66.0 in | Wt 161.4 lb

## 2015-09-30 DIAGNOSIS — R300 Dysuria: Secondary | ICD-10-CM | POA: Diagnosis not present

## 2015-09-30 MED ORDER — CEPHALEXIN 500 MG PO CAPS: 500.0000 mg | ORAL_CAPSULE | Freq: Two times a day (BID) | ORAL | Status: DC

## 2015-09-30 MED FILL — CEPHALEXIN 500 MG CAPSULE: 500 | 7 days supply | Qty: 14 | Fill #0

## 2015-09-30 NOTE — Patient Instructions (Signed)
Your symptoms are consistent with a bladder infection, also called acute cystitis. Please take your antibiotic (Keflex) as directed until all pills are gone.  Stay very well hydrated.  Consider a daily probiotic (Align, Culturelle, or Activia) to help prevent stomach upset caused by the antibiotic.  Taking a probiotic daily may also help prevent recurrent UTIs.  Also consider taking AZO (Phenazopyridine) tablets to help decrease pain with urination.  I will call you with your urine testing results.  We will change antibiotics if indicated.  Call or return to clinic if symptoms are not resolved by completion of antibiotic.   Urinary Tract Infection A urinary tract infection (UTI) can occur any place along the urinary tract. The tract includes the kidneys, ureters, bladder, and urethra. A type of germ called bacteria often causes a UTI. UTIs are often helped with antibiotic medicine.  HOME CARE   If given, take antibiotics as told by your doctor. Finish them even if you start to feel better.  Drink enough fluids to keep your pee (urine) clear or pale yellow.  Avoid tea, drinks with caffeine, and bubbly (carbonated) drinks.  Pee often. Avoid holding your pee in for a long time.  Pee before and after having sex (intercourse).  Wipe from front to back after you poop (bowel movement) if you are a woman. Use each tissue only once. GET HELP RIGHT AWAY IF:   You have back pain.  You have lower belly (abdominal) pain.  You have chills.  You feel sick to your stomach (nauseous).  You throw up (vomit).  Your burning or discomfort with peeing does not go away.  You have a fever.  Your symptoms are not better in 3 days. MAKE SURE YOU:   Understand these instructions.  Will watch your condition.  Will get help right away if you are not doing well or get worse. Document Released: 08/25/2007 Document Revised: 12/01/2011 Document Reviewed: 10/07/2011 ExitCare Patient Information 2015  [employer]. This information is not intended to replace advice given to you by your health care provider. Make sure you discuss any questions you have with your health care provider.   

## 2015-10-01 ENCOUNTER — Ambulatory Visit (INDEPENDENT_AMBULATORY_CARE_PROVIDER_SITE_OTHER): Payer: Medicare Other | Admitting: Family Medicine

## 2015-10-01 ENCOUNTER — Encounter: Payer: Self-pay | Admitting: Family Medicine

## 2015-10-01 ENCOUNTER — Ambulatory Visit: Payer: Medicare Other | Admitting: Family Medicine

## 2015-10-01 VITALS — BP 108/69 | HR 82 | Ht 66.0 in | Wt 158.0 lb

## 2015-10-01 DIAGNOSIS — M25511 Pain in right shoulder: Secondary | ICD-10-CM | POA: Diagnosis not present

## 2015-10-01 NOTE — Patient Instructions (Signed)
You have rotator cuff impingement Try to avoid painful activities (overhead activities, lifting with extended arm) as much as possible. Diclofenac twice a day with food for pain and inflammation. Can take tylenol in addition to this. Subacromial injection may be beneficial to help with pain and to decrease inflammation. Consider physical therapy with transition to home exercise program. Do home exercise program with theraband and scapular stabilization exercises daily - these are very important for long term relief even if an injection was given.  3 sets of 10 once a day. If not improving at follow-up we will consider injection, physical therapy, and/or nitro patches. Follow up with me in 6 weeks. Have fun on your trip!

## 2015-10-02 NOTE — Progress Notes (Signed)
   History of Present Illness: Patient is a 70 y.o. female who present to the clinic today complaining of urinary frequency, urinary urgency and dysuria x 4 days.  Patient endorses suprapubic pressure.  Patient denies fever, chills, nausea/vomiting and flank pain. Denies hematuria. + history of UTI.   History: Past Medical History  Diagnosis Date  . Hypertension   . Hyperlipidemia   . Anxiety     Current outpatient prescriptions:  .  aspirin 81 MG tablet, Take 81 mg by mouth daily., Disp: , Rfl:  .  citalopram (CELEXA) 10 MG tablet, TAKE 1 TABLET (10 MG TOTAL) BY MOUTH DAILY., Disp: 90 tablet, Rfl: 3 .  Cranberry 50 MG CHEW, Chew by mouth as needed., Disp: , Rfl:  .  diclofenac (VOLTAREN) 75 MG EC tablet, Take 1 tablet (75 mg total) by mouth 2 (two) times daily., Disp: 30 tablet, Rfl: 0 .  Loratadine 10 MG CAPS, 1 po qd (Patient taking differently: Take 1 capsule by mouth daily as needed. ), Disp: 30 each, Rfl:  .  losartan-hydrochlorothiazide (HYZAAR) 50-12.5 MG tablet, TAKE 1 TABLET BY MOUTH DAILY., Disp: 90 tablet, Rfl: 1 .  Multiple Vitamin (MULTIVITAMIN) capsule, Take 1 capsule by mouth daily., Disp: , Rfl:  .  Phenazopyridine HCl (AZO TABS PO), Take by mouth daily as needed., Disp: , Rfl:  .  cephALEXin (KEFLEX) 500 MG capsule, Take 1 capsule (500 mg total) by mouth 2 (two) times daily., Disp: 14 capsule, Rfl: 0 Allergies  Allergen Reactions  . Codeine     Extreme Vomiting  . Penicillins     Redness, swelling and itching  . Tetracyclines & Related     Yeast Infection and Stomach problems   Family History  Problem Relation Age of Onset  . Heart disease Neg Hx   . Glaucoma Neg Hx    Social History   Social History  . Marital Status: Married    Spouse Name: N/A  . Number of Children: N/A  . Years of Education: N/A   Social History Main Topics  . Smoking status: Former Research scientist (life sciences)  . Smokeless tobacco: None  . Alcohol Use: No  . Drug Use: None  . Sexual Activity: Not  Asked   Other Topics Concern  . None   Social History Narrative    Review of Systems: See HPI.  All other ROS are negative.  Physical Examination: BP 132/76 mmHg  Pulse 63  Temp(Src) 98.1 F (36.7 C) (Oral)  Resp 16  Ht 5\' 6"  (1.676 m)  Wt 161 lb 6 oz (73.199 kg)  BMI 26.06 kg/m2  SpO2 95%  General appearance: alert, cooperative, appears stated age and no distress Head: Normocephalic, without obvious abnormality, atraumatic Lungs: clear to auscultation bilaterally Heart: regular rate and rhythm, S1, S2 normal, no murmur, click, rub or gallop Abdomen: soft, non-tender; bowel sounds normal; no masses,  no organomegaly and no CVA tenderness.  Labs/Diagnostics: Urinalysis    Component Value Date/Time   BILIRUBINUR neg 05/19/2015 1542   PROTEINUR 0.2 05/19/2015 1542   UROBILINOGEN negative 05/19/2015 1542   NITRITE neg 05/19/2015 1542   LEUKOCYTESUR small (1+)* 05/19/2015 1542    Assessment/Plan: 1. Dysuria Urine dipstick performed but deemed inaccurate due to patient's use of AZO today. Culture sent. Will start Keflex. Supportive measures reviewed. Will alter regimen based on culture results.  - POCT urinalysis dipstick

## 2015-10-06 DIAGNOSIS — M25511 Pain in right shoulder: Secondary | ICD-10-CM | POA: Insufficient documentation

## 2015-10-06 NOTE — Progress Notes (Signed)
PCP: [name], DO Consultation requested by Mackie Pai PA-C  Subjective:   HPI: Patient is a 70 y.o. female here for right shoulder pain.  Patient reports she had problems with her right shoulder about 25 years ago when she had frozen shoulder. She had a fall 9 months ago onto this shoulder but did ok after that. Only in past 4-6 weeks has she had worsening pain, motion in her right shoulder. Associated with a popping sensation. Pain is 3/10, lateral, can be sharp. No night pain. Worse lying on this side. No icing, heat, exercises. Taking diclofenac which she thinks helps. Is leaving for a trip to [place] in early August. No skin changes, numbness.  Past Medical History  Diagnosis Date  . Hypertension   . Hyperlipidemia   . Anxiety     Current Outpatient Prescriptions on File Prior to Visit  Medication Sig Dispense Refill  . aspirin 81 MG tablet Take 81 mg by mouth daily.    . cephALEXin (KEFLEX) 500 MG capsule Take 1 capsule (500 mg total) by mouth 2 (two) times daily. 14 capsule 0  . citalopram (CELEXA) 10 MG tablet TAKE 1 TABLET (10 MG TOTAL) BY MOUTH DAILY. 90 tablet 3  . Cranberry 50 MG CHEW Chew by mouth as needed.    . diclofenac (VOLTAREN) 75 MG EC tablet Take 1 tablet (75 mg total) by mouth 2 (two) times daily. 30 tablet 0  . Loratadine 10 MG CAPS 1 po qd (Patient taking differently: Take 1 capsule by mouth daily as needed. ) 30 each   . losartan-hydrochlorothiazide (HYZAAR) 50-12.5 MG tablet TAKE 1 TABLET BY MOUTH DAILY. 90 tablet 1  . Multiple Vitamin (MULTIVITAMIN) capsule Take 1 capsule by mouth daily.    . Phenazopyridine HCl (AZO TABS PO) Take by mouth daily as needed.     No current facility-administered medications on file prior to visit.    Past Surgical History  Procedure Laterality Date  . Hemorrhoid surgery    . Laparoscopic liver cyst removal    . Ovarian cyst surgery      Allergies  Allergen Reactions  . Codeine     Extreme  Vomiting  . Penicillins     Redness, swelling and itching  . Tetracyclines & Related     Yeast Infection and Stomach problems    Social History   Social History  . Marital Status: Married    Spouse Name: N/A  . Number of Children: N/A  . Years of Education: N/A   Occupational History  . Not on file.   Social History Main Topics  . Smoking status: Former Research scientist (life sciences)  . Smokeless tobacco: Not on file  . Alcohol Use: No  . Drug Use: Not on file  . Sexual Activity: Not on file   Other Topics Concern  . Not on file   Social History Narrative    Family History  Problem Relation Age of Onset  . Heart disease Neg Hx   . Glaucoma Neg Hx     BP 108/69 mmHg  Pulse 82  Ht 5\' 6"  (1.676 m)  Wt 158 lb (71.668 kg)  BMI 25.51 kg/m2  Review of Systems: See HPI above.    Objective:  Physical Exam:  Gen: NAD, comfortable in exam room  Right shoulder: No swelling, ecchymoses.  No gross deformity. No TTP. FROM with painful arc. Positive Hawkins, negative Neers. Negative Speeds, Yergasons. Strength 5/5 with empty can and resisted internal/external rotation.  Pain with empty  can. Negative apprehension. NV intact distally.  Left shoulder: FROM without pain.    Assessment & Plan:  1. Right shoulder pain - 2/2 rotator cuff impingement.  Do not think this is related to the fall she had 9 months ago.  Exam is reassuring.  Continue the diclofenac twice a day.  Shown home exercises to do daily.  She will consider physical therapy, injection, nitro patches.  If she wants an injection advised she come at least a week before she leaves on her trip.  F/u in 6 weeks otherwise.

## 2015-10-06 NOTE — Assessment & Plan Note (Signed)
2/2 rotator cuff impingement.  Do not think this is related to the fall she had 9 months ago.  Exam is reassuring.  Continue the diclofenac twice a day.  Shown home exercises to do daily.  She will consider physical therapy, injection, nitro patches.  If she wants an injection advised she come at least a week before she leaves on her trip.  F/u in 6 weeks otherwise.

## 2015-10-08 DIAGNOSIS — H2513 Age-related nuclear cataract, bilateral: Secondary | ICD-10-CM | POA: Diagnosis not present

## 2015-11-07 ENCOUNTER — Other Ambulatory Visit: Payer: Self-pay | Admitting: Family Medicine

## 2015-11-18 ENCOUNTER — Encounter: Payer: Self-pay | Admitting: Gastroenterology

## 2015-11-18 ENCOUNTER — Other Ambulatory Visit (HOSPITAL_COMMUNITY)
Admission: RE | Admit: 2015-11-18 | Discharge: 2015-11-18 | Disposition: A | Discharge status: Home / Self Care | Payer: Medicare Other | Source: Ambulatory Visit | Attending: Family Medicine | Admitting: Family Medicine

## 2015-11-18 ENCOUNTER — Ambulatory Visit (INDEPENDENT_AMBULATORY_CARE_PROVIDER_SITE_OTHER): Payer: Medicare Other | Admitting: Family Medicine

## 2015-11-18 ENCOUNTER — Encounter: Payer: Self-pay | Admitting: Family Medicine

## 2015-11-18 VITALS — BP 118/60 | HR 64 | Temp 98.2°F | Ht 65.0 in | Wt 155.6 lb

## 2015-11-18 DIAGNOSIS — Z1151 Encounter for screening for human papillomavirus (HPV): Secondary | ICD-10-CM | POA: Diagnosis not present

## 2015-11-18 DIAGNOSIS — F4323 Adjustment disorder with mixed anxiety and depressed mood: Secondary | ICD-10-CM | POA: Diagnosis not present

## 2015-11-18 DIAGNOSIS — E538 Deficiency of other specified B group vitamins: Secondary | ICD-10-CM | POA: Diagnosis not present

## 2015-11-18 DIAGNOSIS — I10 Essential (primary) hypertension: Secondary | ICD-10-CM

## 2015-11-18 DIAGNOSIS — R195 Other fecal abnormalities: Secondary | ICD-10-CM

## 2015-11-18 DIAGNOSIS — Z01419 Encounter for gynecological examination (general) (routine) without abnormal findings: Secondary | ICD-10-CM | POA: Insufficient documentation

## 2015-11-18 DIAGNOSIS — Z124 Encounter for screening for malignant neoplasm of cervix: Secondary | ICD-10-CM

## 2015-11-18 DIAGNOSIS — Z0000 Encounter for general adult medical examination without abnormal findings: Secondary | ICD-10-CM | POA: Diagnosis not present

## 2015-11-18 LAB — COMPREHENSIVE METABOLIC PANEL
ALK PHOS: 52 U/L (ref 39–117)
ALT: 15 U/L (ref 0–35)
AST: 22 U/L (ref 0–37)
Albumin: 4.1 g/dL (ref 3.5–5.2)
BILIRUBIN TOTAL: 0.7 mg/dL (ref 0.2–1.2)
BUN: 11 mg/dL (ref 6–23)
CO2: 30 mEq/L (ref 19–32)
CREATININE: 0.68 mg/dL (ref 0.40–1.20)
Calcium: 8.9 mg/dL (ref 8.4–10.5)
Chloride: 99 mEq/L (ref 96–112)
GFR: 91 mL/min (ref >60.00)
GLUCOSE: 90 mg/dL (ref 70–99)
Potassium: 3.8 mEq/L (ref 3.5–5.1)
Sodium: 136 mEq/L (ref 135–145)
TOTAL PROTEIN: 7.2 g/dL (ref 6.0–8.3)

## 2015-11-18 LAB — CBC WITH DIFFERENTIAL/PLATELET
Basophils Absolute: 0.1 10*3/uL (ref 0.0–0.1)
Basophils Relative: 0.8 % (ref 0.0–3.0)
Eosinophils Absolute: 0.2 10*3/uL (ref 0.0–0.7)
Eosinophils Relative: 2.4 % (ref 0.0–5.0)
HCT: 42.4 % (ref 36.0–46.0)
Hemoglobin: 14.3 g/dL (ref 12.0–15.0)
Lymphocytes Relative: 16.6 % (ref 12.0–46.0)
Lymphs Abs: 1.4 10*3/uL (ref 0.7–4.0)
MCHC: 33.8 g/dL (ref 30.0–36.0)
MCV: 95.2 fl (ref 78.0–100.0)
Monocytes Absolute: 0.7 10*3/uL (ref 0.1–1.0)
Monocytes Relative: 9 % (ref 3.0–12.0)
Neutro Abs: 5.9 10*3/uL (ref 1.4–7.7)
Neutrophils Relative %: 71.2 % (ref 43.0–77.0)
Platelets: 301 10*3/uL (ref 150.0–400.0)
RBC: 4.46 Mil/uL (ref 3.87–5.11)
RDW: 13.8 % (ref 11.5–15.5)
WBC: 8.3 10*3/uL (ref 4.0–10.5)

## 2015-11-18 LAB — LIPID PANEL
Cholesterol: 227 mg/dL — ABNORMAL HIGH (ref 0–200)
HDL: 77.7 mg/dL (ref >39.00)
LDL Cholesterol: 130 mg/dL — ABNORMAL HIGH (ref 0–99)
NonHDL: 149.22
Total CHOL/HDL Ratio: 3
Triglycerides: 94 mg/dL (ref 0.0–149.0)
VLDL: 18.8 mg/dL (ref 0.0–40.0)

## 2015-11-18 LAB — POC HEMOCCULT BLD/STL (OFFICE/1-CARD/DIAGNOSTIC): FECAL OCCULT BLD: POSITIVE — AB

## 2015-11-18 LAB — VITAMIN B12: Vitamin B-12: 238 pg/mL (ref 211–911)

## 2015-11-18 MED ORDER — LOSARTAN POTASSIUM-HCTZ 50-12.5 MG PO TABS: 1.0000 | ORAL_TABLET | Freq: Every day | ORAL | 1 refills | Status: DC

## 2015-11-18 MED ORDER — CITALOPRAM HYDROBROMIDE 10 MG PO TABS: ORAL_TABLET | ORAL | 3 refills | Status: DC

## 2015-11-18 NOTE — Progress Notes (Signed)
Subjective:   Kristen Schultz is a 70 y.o. female who presents for Medicare Annual (Subsequent) preventive examination.  Review of Systems:   Review of Systems  Constitutional: Negative for activity change, appetite change and fatigue.  HENT: Negative for hearing loss, congestion, tinnitus and ear discharge.   Eyes: Negative for visual disturbance (see optho q1y -- vision corrected to 20/20 with glasses).  Respiratory: Negative for cough, chest tightness and shortness of breath.   Cardiovascular: Negative for chest pain, palpitations and leg swelling.  Gastrointestinal: Negative for abdominal pain, diarrhea, constipation and abdominal distention.  Genitourinary: Negative for urgency, frequency, decreased urine volume and difficulty urinating.  Musculoskeletal: Negative for back pain, arthralgias and gait problem.  Skin: Negative for color change, pallor and rash.  Neurological: Negative for dizziness, light-headedness, numbness and headaches.  Hematological: Negative for adenopathy. Does not bruise/bleed easily.  Psychiatric/Behavioral: Negative for suicidal ideas, confusion, sleep disturbance, self-injury, dysphoric mood, decreased concentration and agitation.  Pt is able to read and write and can do all ADLs No risk for falling No abuse/ violence in home          Objective:     Vitals: BP 118/60 (BP Location: Left Arm, Patient Position: Sitting, Cuff Size: Small)   Pulse 64   Temp 98.2 F (36.8 C) (Oral)   Ht 5\' 5"  (1.651 m)   Wt 155 lb 9.6 oz (70.6 kg)   SpO2 98%   BMI 25.89 kg/m   Body mass index is 25.89 kg/m.  BP 118/60 (BP Location: Left Arm, Patient Position: Sitting, Cuff Size: Small)   Pulse 64   Temp 98.2 F (36.8 C) (Oral)   Ht 5\' 5"  (1.651 m)   Wt 155 lb 9.6 oz (70.6 kg)   SpO2 98%   BMI 25.89 kg/m  General appearance: alert, cooperative, appears stated age and no distress Head: Normocephalic, without obvious abnormality, atraumatic Eyes:  conjunctivae/corneas clear. PERRL, EOM's intact. Fundi benign. Ears: normal TM's and external ear canals both ears Nose: Nares normal. Septum midline. Mucosa normal. No drainage or sinus tenderness. Throat: lips, mucosa, and tongue normal; teeth and gums normal Neck: no adenopathy, no carotid bruit, no JVD, supple, symmetrical, trachea midline and thyroid not enlarged, symmetric, no tenderness/mass/nodules Back: symmetric, no curvature. ROM normal. No CVA tenderness. Lungs: clear to auscultation bilaterally Breasts: normal appearance, no masses or tenderness Heart: regular rate and rhythm, S1, S2 normal, no murmur, click, rub or gallop Abdomen: soft, non-tender; bowel sounds normal; no masses,  no organomegaly Pelvic: cervix normal in appearance, external genitalia normal, no adnexal masses or tenderness, no cervical motion tenderness, rectovaginal septum normal, uterus normal size, shape, and consistency, vagina normal without discharge and pap done  Rectal-- heme positive stool ,  Stool black, tarry Extremities: extremities normal, atraumatic, no cyanosis or edema Pulses: 2+ and symmetric Skin: Skin color, texture, turgor normal. No rashes or lesions Lymph nodes: Cervical, supraclavicular, and axillary nodes normal. Neurologic: Alert and oriented X 3, normal strength and tone. Normal symmetric reflexes. Normal coordination and gait Tobacco History  Smoking Status  . Former Smoker  . Packs/day: 0.30  . Years: 15.00  . Quit date: 11/17/1985  Smokeless Tobacco  . Never Used     Counseling given: Not Answered   Past Medical History:  Diagnosis Date  . Anxiety   . Hyperlipidemia   . Hypertension    Past Surgical History:  Procedure Laterality Date  . HEMORRHOID SURGERY    . LAPAROSCOPIC LIVER CYST REMOVAL    .  OVARIAN CYST SURGERY     Family History  Problem Relation Age of Onset  . Heart disease Neg Hx   . Glaucoma Neg Hx    History  Sexual Activity  . Sexual activity:  Yes  . Partners: Male    Outpatient Encounter Prescriptions as of 11/18/2015  Medication Sig  . aspirin 81 MG tablet Take 81 mg by mouth daily.  . citalopram (CELEXA) 10 MG tablet TAKE 1 TABLET (10 MG TOTAL) BY MOUTH DAILY.  Marland Kitchen diclofenac (VOLTAREN) 75 MG EC tablet Take 1 tablet (75 mg total) by mouth 2 (two) times daily.  . Loratadine 10 MG CAPS 1 po qd (Patient taking differently: Take 1 capsule by mouth daily as needed. )  . losartan-hydrochlorothiazide (HYZAAR) 50-12.5 MG tablet Take 1 tablet by mouth daily.  . Multiple Vitamin (MULTIVITAMIN) capsule Take 1 capsule by mouth daily.  . [DISCONTINUED] citalopram (CELEXA) 10 MG tablet TAKE 1 TABLET (10 MG TOTAL) BY MOUTH DAILY.  . [DISCONTINUED] losartan-hydrochlorothiazide (HYZAAR) 50-12.5 MG tablet TAKE 1 TABLET BY MOUTH DAILY.  . [DISCONTINUED] cephALEXin (KEFLEX) 500 MG capsule Take 1 capsule (500 mg total) by mouth 2 (two) times daily.  . [DISCONTINUED] Cranberry 50 MG CHEW Chew by mouth as needed.  . [DISCONTINUED] Phenazopyridine HCl (AZO TABS PO) Take by mouth daily as needed.   No facility-administered encounter medications on file as of 11/18/2015.     Activities of Daily Living In your present state of health, do you have any difficulty performing the following activities: 11/18/2015  Hearing? N  Vision? N  Difficulty concentrating or making decisions? N  Walking or climbing stairs? N  Dressing or bathing? N  Doing errands, shopping? N  Some recent data might be hidden    Patient Care Team: [name], DO as PCP - General (Family Medicine) Otelia Sergeant, OD as Referring Physician (Optometry) Dene Gentry, MD as Consulting Physician (Sports Medicine)    Assessment:    cpe Exercise Activities and Dietary recommendations Current Exercise Habits: Home exercise routine, Type of exercise: walking, Time (Minutes): 60, Frequency (Times/Week): 4, Weekly Exercise (Minutes/Week): 240, Intensity: Moderate, Exercise  limited by: None identified  Goals    None     Fall Risk Fall Risk  11/18/2015 11/11/2014  Falls in the past year? No No   Depression Screen PHQ 2/9 Scores 11/18/2015 11/11/2014  PHQ - 2 Score 0 0     Cognitive Testing MMSE - Mini Mental State Exam 11/18/2015  Orientation to time 5  Orientation to Place 5  Registration 3  Attention/ Calculation 5  Recall 3  Language- name 2 objects 2  Language- repeat 1  Language- follow 3 step command 3  Language- read & follow direction 1  Write a sentence 1  Copy design 1  Total score 30    Immunization History  Administered Date(s) Administered  . Influenza,inj,Quad PF,36+ Mos 01/17/2015  . Pneumococcal Conjugate-13 01/17/2015  . Pneumococcal Polysaccharide-23 06/23/2011, 04/27/2012  . Pneumococcal-Unspecified 01/26/2013  . Td 03/22/2009  . Zoster 03/22/2012   Screening Tests Health Maintenance  Topic Date Due  . INFLUENZA VACCINE  10/21/2015  . MAMMOGRAM  01/08/2017  . COLONOSCOPY  10/17/2018  . TETANUS/TDAP  03/23/2019  . DEXA SCAN  Completed  . ZOSTAVAX  Completed  . Hepatitis C Screening  Completed  . PNA vac Low Risk Adult  Completed      Plan:    see avs During the course of the visit the patient was  educated and counseled about the following appropriate screening and preventive services:   Vaccines to include Pneumoccal, Influenza, Hepatitis B, Td, Zostavax, HCV  Electrocardiogram  Cardiovascular Disease  Colorectal cancer screening  Bone density screening  Diabetes screening  Glaucoma screening  Mammography/PAP  Nutrition counseling   Patient Instructions (the written plan) was given to the patient.  1. Adjustment disorder with mixed anxiety and depressed mood stable - Comprehensive metabolic panel - Lipid panel - CBC with Differential/Platelet - POCT urinalysis dipstick - losartan-hydrochlorothiazide (HYZAAR) 50-12.5 MG tablet; Take 1 tablet by mouth daily.  Dispense: 90 tablet; Refill: 1 -  citalopram (CELEXA) 10 MG tablet; TAKE 1 TABLET (10 MG TOTAL) BY MOUTH DAILY.  Dispense: 90 tablet; Refill: 3  2. Essential hypertension stable - Comprehensive metabolic panel - Lipid panel - CBC with Differential/Platelet - POCT urinalysis dipstick - losartan-hydrochlorothiazide (HYZAAR) 50-12.5 MG tablet; Take 1 tablet by mouth daily.  Dispense: 90 tablet; Refill: 1  3. Preventative health care See above  4. Heme positive stool Zantac,  Refer to gi - Ambulatory referral to Gastroenterology  [name], DO  11/19/2015

## 2015-11-18 NOTE — Patient Instructions (Signed)
Preventive Care for Adults, Female A healthy lifestyle and preventive care can promote health and wellness. Preventive health guidelines for women include the following key practices.  A routine yearly physical is a good way to check with your health care provider about your health and preventive screening. It is a chance to share any concerns and updates on your health and to receive a thorough exam.  Visit your dentist for a routine exam and preventive care every 6 months. Brush your teeth twice a day and floss once a day. Good oral hygiene prevents tooth decay and gum disease.  The frequency of eye exams is based on your age, health, family medical history, use of contact lenses, and other factors. Follow your health care provider's recommendations for frequency of eye exams.  Eat a healthy diet. Foods like vegetables, fruits, whole grains, low-fat dairy products, and lean protein foods contain the nutrients you need without too many calories. Decrease your intake of foods high in solid fats, added sugars, and salt. Eat the right amount of calories for you.Get information about a proper diet from your health care provider, if necessary.  Regular physical exercise is one of the most important things you can do for your health. Most adults should get at least 150 minutes of moderate-intensity exercise (any activity that increases your heart rate and causes you to sweat) each week. In addition, most adults need muscle-strengthening exercises on 2 or more days a week.  Maintain a healthy weight. The body mass index (BMI) is a screening tool to identify possible weight problems. It provides an estimate of body fat based on height and weight. Your health care provider can find your BMI and can help you achieve or maintain a healthy weight.For adults 20 years and older:  A BMI below 18.5 is considered underweight.  A BMI of 18.5 to 24.9 is normal.  A BMI of 25 to 29.9 is considered overweight.  A  BMI of 30 and above is considered obese.  Maintain normal blood lipids and cholesterol levels by exercising and minimizing your intake of saturated fat. Eat a balanced diet with plenty of fruit and vegetables. Blood tests for lipids and cholesterol should begin at age 45 and be repeated every 5 years. If your lipid or cholesterol levels are high, you are over 50, or you are at high risk for heart disease, you may need your cholesterol levels checked more frequently.Ongoing high lipid and cholesterol levels should be treated with medicines if diet and exercise are not working.  If you smoke, find out from your health care provider how to quit. If you do not use tobacco, do not start.  Lung cancer screening is recommended for adults aged 45-80 years who are at high risk for developing lung cancer because of a history of smoking. A yearly low-dose CT scan of the lungs is recommended for people who have at least a 30-pack-year history of smoking and are a current smoker or have quit within the past 15 years. A pack year of smoking is smoking an average of 1 pack of cigarettes a day for 1 year (for example: 1 pack a day for 30 years or 2 packs a day for 15 years). Yearly screening should continue until the smoker has stopped smoking for at least 15 years. Yearly screening should be stopped for people who develop a health problem that would prevent them from having lung cancer treatment.  If you are pregnant, do not drink alcohol. If you are  breastfeeding, be very cautious about drinking alcohol. If you are not pregnant and choose to drink alcohol, do not have more than 1 drink per day. One drink is considered to be 12 ounces (355 mL) of beer, 5 ounces (148 mL) of wine, or 1.5 ounces (44 mL) of liquor.  Avoid use of street drugs. Do not share needles with anyone. Ask for help if you need support or instructions about stopping the use of drugs.  High blood pressure causes heart disease and increases the risk  of stroke. Your blood pressure should be checked at least every 1 to 2 years. Ongoing high blood pressure should be treated with medicines if weight loss and exercise do not work.  If you are 55-79 years old, ask your health care provider if you should take aspirin to prevent strokes.  Diabetes screening is done by taking a blood sample to check your blood glucose level after you have not eaten for a certain period of time (fasting). If you are not overweight and you do not have risk factors for diabetes, you should be screened once every 3 years starting at age 45. If you are overweight or obese and you are 40-70 years of age, you should be screened for diabetes every year as part of your cardiovascular risk assessment.  Breast cancer screening is essential preventive care for women. You should practice "breast self-awareness." This means understanding the normal appearance and feel of your breasts and may include breast self-examination. Any changes detected, no matter how small, should be reported to a health care provider. Women in their 20s and 30s should have a clinical breast exam (CBE) by a health care provider as part of a regular health exam every 1 to 3 years. After age 40, women should have a CBE every year. Starting at age 40, women should consider having a mammogram (breast X-ray test) every year. Women who have a family history of breast cancer should talk to their health care provider about genetic screening. Women at a high risk of breast cancer should talk to their health care providers about having an MRI and a mammogram every year.  Breast cancer gene (BRCA)-related cancer risk assessment is recommended for women who have family members with BRCA-related cancers. BRCA-related cancers include breast, ovarian, tubal, and peritoneal cancers. Having family members with these cancers may be associated with an increased risk for harmful changes (mutations) in the breast cancer genes BRCA1 and  BRCA2. Results of the assessment will determine the need for genetic counseling and BRCA1 and BRCA2 testing.  Your health care provider may recommend that you be screened regularly for cancer of the pelvic organs (ovaries, uterus, and vagina). This screening involves a pelvic examination, including checking for microscopic changes to the surface of your cervix (Pap test). You may be encouraged to have this screening done every 3 years, beginning at age 21.  For women ages 30-65, health care providers may recommend pelvic exams and Pap testing every 3 years, or they may recommend the Pap and pelvic exam, combined with testing for human papilloma virus (HPV), every 5 years. Some types of HPV increase your risk of cervical cancer. Testing for HPV may also be done on women of any age with unclear Pap test results.  Other health care providers may not recommend any screening for nonpregnant women who are considered low risk for pelvic cancer and who do not have symptoms. Ask your health care provider if a screening pelvic exam is right for   you.  If you have had past treatment for cervical cancer or a condition that could lead to cancer, you need Pap tests and screening for cancer for at least 20 years after your treatment. If Pap tests have been discontinued, your risk factors (such as having a new sexual partner) need to be reassessed to determine if screening should resume. Some women have medical problems that increase the chance of getting cervical cancer. In these cases, your health care provider may recommend more frequent screening and Pap tests.  Colorectal cancer can be detected and often prevented. Most routine colorectal cancer screening begins at the age of 50 years and continues through age 75 years. However, your health care provider may recommend screening at an earlier age if you have risk factors for colon cancer. On a yearly basis, your health care provider may provide home test kits to check  for hidden blood in the stool. Use of a small camera at the end of a tube, to directly examine the colon (sigmoidoscopy or colonoscopy), can detect the earliest forms of colorectal cancer. Talk to your health care provider about this at age 50, when routine screening begins. Direct exam of the colon should be repeated every 5-10 years through age 75 years, unless early forms of precancerous polyps or small growths are found.  People who are at an increased risk for hepatitis B should be screened for this virus. You are considered at high risk for hepatitis B if:  You were born in a country where hepatitis B occurs often. Talk with your health care provider about which countries are considered high risk.  Your parents were born in a high-risk country and you have not received a shot to protect against hepatitis B (hepatitis B vaccine).  You have HIV or AIDS.  You use needles to inject street drugs.  You live with, or have sex with, someone who has hepatitis B.  You get hemodialysis treatment.  You take certain medicines for conditions like cancer, organ transplantation, and autoimmune conditions.  Hepatitis C blood testing is recommended for all people born from 1945 through 1965 and any individual with known risks for hepatitis C.  Practice safe sex. Use condoms and avoid high-risk sexual practices to reduce the spread of sexually transmitted infections (STIs). STIs include gonorrhea, chlamydia, syphilis, trichomonas, herpes, HPV, and human immunodeficiency virus (HIV). Herpes, HIV, and HPV are viral illnesses that have no cure. They can result in disability, cancer, and death.  You should be screened for sexually transmitted illnesses (STIs) including gonorrhea and chlamydia if:  You are sexually active and are younger than 24 years.  You are older than 24 years and your health care provider tells you that you are at risk for this type of infection.  Your sexual activity has changed  since you were last screened and you are at an increased risk for chlamydia or gonorrhea. Ask your health care provider if you are at risk.  If you are at risk of being infected with HIV, it is recommended that you take a prescription medicine daily to prevent HIV infection. This is called preexposure prophylaxis (PrEP). You are considered at risk if:  You are sexually active and do not regularly use condoms or know the HIV status of your partner(s).  You take drugs by injection.  You are sexually active with a partner who has HIV.  Talk with your health care provider about whether you are at high risk of being infected with HIV. If   you choose to begin PrEP, you should first be tested for HIV. You should then be tested every 3 months for as long as you are taking PrEP.  Osteoporosis is a disease in which the bones lose minerals and strength with aging. This can result in serious bone fractures or breaks. The risk of osteoporosis can be identified using a bone density scan. Women ages 67 years and over and women at risk for fractures or osteoporosis should discuss screening with their health care providers. Ask your health care provider whether you should take a calcium supplement or vitamin D to reduce the rate of osteoporosis.  Menopause can be associated with physical symptoms and risks. Hormone replacement therapy is available to decrease symptoms and risks. You should talk to your health care provider about whether hormone replacement therapy is right for you.  Use sunscreen. Apply sunscreen liberally and repeatedly throughout the day. You should seek shade when your shadow is shorter than you. Protect yourself by wearing long sleeves, pants, a wide-brimmed hat, and sunglasses year round, whenever you are outdoors.  Once a month, do a whole body skin exam, using a mirror to look at the skin on your back. Tell your health care provider of new moles, moles that have irregular borders, moles that  are larger than a pencil eraser, or moles that have changed in shape or color.  Stay current with required vaccines (immunizations).  Influenza vaccine. All adults should be immunized every year.  Tetanus, diphtheria, and acellular pertussis (Td, Tdap) vaccine. Pregnant women should receive 1 dose of Tdap vaccine during each pregnancy. The dose should be obtained regardless of the length of time since the last dose. Immunization is preferred during the 27th-36th week of gestation. An adult who has not previously received Tdap or who does not know her vaccine status should receive 1 dose of Tdap. This initial dose should be followed by tetanus and diphtheria toxoids (Td) booster doses every 10 years. Adults with an unknown or incomplete history of completing a 3-dose immunization series with Td-containing vaccines should begin or complete a primary immunization series including a Tdap dose. Adults should receive a Td booster every 10 years.  Varicella vaccine. An adult without evidence of immunity to varicella should receive 2 doses or a second dose if she has previously received 1 dose. Pregnant females who do not have evidence of immunity should receive the first dose after pregnancy. This first dose should be obtained before leaving the health care facility. The second dose should be obtained 4-8 weeks after the first dose.  Human papillomavirus (HPV) vaccine. Females aged 13-26 years who have not received the vaccine previously should obtain the 3-dose series. The vaccine is not recommended for use in pregnant females. However, pregnancy testing is not needed before receiving a dose. If a female is found to be pregnant after receiving a dose, no treatment is needed. In that case, the remaining doses should be delayed until after the pregnancy. Immunization is recommended for any person with an immunocompromised condition through the age of 61 years if she did not get any or all doses earlier. During the  3-dose series, the second dose should be obtained 4-8 weeks after the first dose. The third dose should be obtained 24 weeks after the first dose and 16 weeks after the second dose.  Zoster vaccine. One dose is recommended for adults aged 30 years or older unless certain conditions are present.  Measles, mumps, and rubella (MMR) vaccine. Adults born  before 1957 generally are considered immune to measles and mumps. Adults born in 1957 or later should have 1 or more doses of MMR vaccine unless there is a contraindication to the vaccine or there is laboratory evidence of immunity to each of the three diseases. A routine second dose of MMR vaccine should be obtained at least 28 days after the first dose for students attending postsecondary schools, health care workers, or international travelers. People who received inactivated measles vaccine or an unknown type of measles vaccine during 1963-1967 should receive 2 doses of MMR vaccine. People who received inactivated mumps vaccine or an unknown type of mumps vaccine before 1979 and are at high risk for mumps infection should consider immunization with 2 doses of MMR vaccine. For females of childbearing age, rubella immunity should be determined. If there is no evidence of immunity, females who are not pregnant should be vaccinated. If there is no evidence of immunity, females who are pregnant should delay immunization until after pregnancy. Unvaccinated health care workers born before 1957 who lack laboratory evidence of measles, mumps, or rubella immunity or laboratory confirmation of disease should consider measles and mumps immunization with 2 doses of MMR vaccine or rubella immunization with 1 dose of MMR vaccine.  Pneumococcal 13-valent conjugate (PCV13) vaccine. When indicated, a person who is uncertain of his immunization history and has no record of immunization should receive the PCV13 vaccine. All adults 65 years of age and older should receive this  vaccine. An adult aged 19 years or older who has certain medical conditions and has not been previously immunized should receive 1 dose of PCV13 vaccine. This PCV13 should be followed with a dose of pneumococcal polysaccharide (PPSV23) vaccine. Adults who are at high risk for pneumococcal disease should obtain the PPSV23 vaccine at least 8 weeks after the dose of PCV13 vaccine. Adults older than 70 years of age who have normal immune system function should obtain the PPSV23 vaccine dose at least 1 year after the dose of PCV13 vaccine.  Pneumococcal polysaccharide (PPSV23) vaccine. When PCV13 is also indicated, PCV13 should be obtained first. All adults aged 65 years and older should be immunized. An adult younger than age 65 years who has certain medical conditions should be immunized. Any person who resides in a nursing home or long-term care facility should be immunized. An adult smoker should be immunized. People with an immunocompromised condition and certain other conditions should receive both PCV13 and PPSV23 vaccines. People with human immunodeficiency virus (HIV) infection should be immunized as soon as possible after diagnosis. Immunization during chemotherapy or radiation therapy should be avoided. Routine use of PPSV23 vaccine is not recommended for American Indians, [place] Natives, or people younger than 65 years unless there are medical conditions that require PPSV23 vaccine. When indicated, people who have unknown immunization and have no record of immunization should receive PPSV23 vaccine. One-time revaccination 5 years after the first dose of PPSV23 is recommended for people aged 19-64 years who have chronic kidney failure, nephrotic syndrome, asplenia, or immunocompromised conditions. People who received 1-2 doses of PPSV23 before age 65 years should receive another dose of PPSV23 vaccine at age 65 years or later if at least 5 years have passed since the previous dose. Doses of PPSV23 are not  needed for people immunized with PPSV23 at or after age 65 years.  Meningococcal vaccine. Adults with asplenia or persistent complement component deficiencies should receive 2 doses of quadrivalent meningococcal conjugate (MenACWY-D) vaccine. The doses should be obtained   at least 2 months apart. Microbiologists working with certain meningococcal bacteria, [place] recruits, people at risk during an outbreak, and people who travel to or live in countries with a high rate of meningitis should be immunized. A first-year college student up through age 34 years who is living in a residence hall should receive a dose if she did not receive a dose on or after her 16th birthday. Adults who have certain high-risk conditions should receive one or more doses of vaccine.  Hepatitis A vaccine. Adults who wish to be protected from this disease, have certain high-risk conditions, work with hepatitis A-infected animals, work in hepatitis A research labs, or travel to or work in countries with a high rate of hepatitis A should be immunized. Adults who were previously unvaccinated and who anticipate close contact with an international adoptee during the first 60 days after arrival in the [place] from a country with a high rate of hepatitis A should be immunized.  Hepatitis B vaccine. Adults who wish to be protected from this disease, have certain high-risk conditions, may be exposed to blood or other infectious body fluids, are household contacts or sex partners of hepatitis B positive people, are clients or workers in certain care facilities, or travel to or work in countries with a high rate of hepatitis B should be immunized.  Haemophilus influenzae type b (Hib) vaccine. A previously unvaccinated person with asplenia or sickle cell disease or having a scheduled splenectomy should receive 1 dose of Hib vaccine. Regardless of previous immunization, a recipient of a hematopoietic stem cell transplant should receive a  3-dose series 6-12 months after her successful transplant. Hib vaccine is not recommended for adults with HIV infection. Preventive Services / Frequency Ages 35 to 4 years  Blood pressure check.** / Every 3-5 years.  Lipid and cholesterol check.** / Every 5 years beginning at age 60.  Clinical breast exam.** / Every 3 years for women in their 71s and 10s.  BRCA-related cancer risk assessment.** / For women who have family members with a BRCA-related cancer (breast, ovarian, tubal, or peritoneal cancers).  Pap test.** / Every 2 years from ages 76 through 26. Every 3 years starting at age 61 through age 76 or 93 with a history of 3 consecutive normal Pap tests.  HPV screening.** / Every 3 years from ages 37 through ages 60 to 51 with a history of 3 consecutive normal Pap tests.  Hepatitis C blood test.** / For any individual with known risks for hepatitis C.  Skin self-exam. / Monthly.  Influenza vaccine. / Every year.  Tetanus, diphtheria, and acellular pertussis (Tdap, Td) vaccine.** / Consult your health care provider. Pregnant women should receive 1 dose of Tdap vaccine during each pregnancy. 1 dose of Td every 10 years.  Varicella vaccine.** / Consult your health care provider. Pregnant females who do not have evidence of immunity should receive the first dose after pregnancy.  HPV vaccine. / 3 doses over 6 months, if 93 and younger. The vaccine is not recommended for use in pregnant females. However, pregnancy testing is not needed before receiving a dose.  Measles, mumps, rubella (MMR) vaccine.** / You need at least 1 dose of MMR if you were born in 1957 or later. You may also need a 2nd dose. For females of childbearing age, rubella immunity should be determined. If there is no evidence of immunity, females who are not pregnant should be vaccinated. If there is no evidence of immunity, females who are  pregnant should delay immunization until after pregnancy.  Pneumococcal  13-valent conjugate (PCV13) vaccine.** / Consult your health care provider.  Pneumococcal polysaccharide (PPSV23) vaccine.** / 1 to 2 doses if you smoke cigarettes or if you have certain conditions.  Meningococcal vaccine.** / 1 dose if you are age 68 to 8 years and a Market researcher living in a residence hall, or have one of several medical conditions, you need to get vaccinated against meningococcal disease. You may also need additional booster doses.  Hepatitis A vaccine.** / Consult your health care provider.  Hepatitis B vaccine.** / Consult your health care provider.  Haemophilus influenzae type b (Hib) vaccine.** / Consult your health care provider. Ages 7 to 53 years  Blood pressure check.** / Every year.  Lipid and cholesterol check.** / Every 5 years beginning at age 25 years.  Lung cancer screening. / Every year if you are aged 11-80 years and have a 30-pack-year history of smoking and currently smoke or have quit within the past 15 years. Yearly screening is stopped once you have quit smoking for at least 15 years or develop a health problem that would prevent you from having lung cancer treatment.  Clinical breast exam.** / Every year after age 48 years.  BRCA-related cancer risk assessment.** / For women who have family members with a BRCA-related cancer (breast, ovarian, tubal, or peritoneal cancers).  Mammogram.** / Every year beginning at age 41 years and continuing for as long as you are in good health. Consult with your health care provider.  Pap test.** / Every 3 years starting at age 65 years through age 37 or 70 years with a history of 3 consecutive normal Pap tests.  HPV screening.** / Every 3 years from ages 72 years through ages 60 to 40 years with a history of 3 consecutive normal Pap tests.  Fecal occult blood test (FOBT) of stool. / Every year beginning at age 21 years and continuing until age 5 years. You may not need to do this test if you get  a colonoscopy every 10 years.  Flexible sigmoidoscopy or colonoscopy.** / Every 5 years for a flexible sigmoidoscopy or every 10 years for a colonoscopy beginning at age 35 years and continuing until age 48 years.  Hepatitis C blood test.** / For all people born from [birth year] through 1965 and any individual with known risks for hepatitis C.  Skin self-exam. / Monthly.  Influenza vaccine. / Every year.  Tetanus, diphtheria, and acellular pertussis (Tdap/Td) vaccine.** / Consult your health care provider. Pregnant women should receive 1 dose of Tdap vaccine during each pregnancy. 1 dose of Td every 10 years.  Varicella vaccine.** / Consult your health care provider. Pregnant females who do not have evidence of immunity should receive the first dose after pregnancy.  Zoster vaccine.** / 1 dose for adults aged 30 years or older.  Measles, mumps, rubella (MMR) vaccine.** / You need at least 1 dose of MMR if you were born in 1957 or later. You may also need a second dose. For females of childbearing age, rubella immunity should be determined. If there is no evidence of immunity, females who are not pregnant should be vaccinated. If there is no evidence of immunity, females who are pregnant should delay immunization until after pregnancy.  Pneumococcal 13-valent conjugate (PCV13) vaccine.** / Consult your health care provider.  Pneumococcal polysaccharide (PPSV23) vaccine.** / 1 to 2 doses if you smoke cigarettes or if you have certain conditions.  Meningococcal vaccine.** /  Consult your health care provider.  Hepatitis A vaccine.** / Consult your health care provider.  Hepatitis B vaccine.** / Consult your health care provider.  Haemophilus influenzae type b (Hib) vaccine.** / Consult your health care provider. Ages 64 years and over  Blood pressure check.** / Every year.  Lipid and cholesterol check.** / Every 5 years beginning at age 23 years.  Lung cancer screening. / Every year if you  are aged 16-80 years and have a 30-pack-year history of smoking and currently smoke or have quit within the past 15 years. Yearly screening is stopped once you have quit smoking for at least 15 years or develop a health problem that would prevent you from having lung cancer treatment.  Clinical breast exam.** / Every year after age 74 years.  BRCA-related cancer risk assessment.** / For women who have family members with a BRCA-related cancer (breast, ovarian, tubal, or peritoneal cancers).  Mammogram.** / Every year beginning at age 44 years and continuing for as long as you are in good health. Consult with your health care provider.  Pap test.** / Every 3 years starting at age 58 years through age 22 or 39 years with 3 consecutive normal Pap tests. Testing can be stopped between 65 and 70 years with 3 consecutive normal Pap tests and no abnormal Pap or HPV tests in the past 10 years.  HPV screening.** / Every 3 years from ages 64 years through ages 70 or 61 years with a history of 3 consecutive normal Pap tests. Testing can be stopped between 65 and 70 years with 3 consecutive normal Pap tests and no abnormal Pap or HPV tests in the past 10 years.  Fecal occult blood test (FOBT) of stool. / Every year beginning at age 40 years and continuing until age 27 years. You may not need to do this test if you get a colonoscopy every 10 years.  Flexible sigmoidoscopy or colonoscopy.** / Every 5 years for a flexible sigmoidoscopy or every 10 years for a colonoscopy beginning at age 7 years and continuing until age 32 years.  Hepatitis C blood test.** / For all people born from [birth year] through 1965 and any individual with known risks for hepatitis C.  Osteoporosis screening.** / A one-time screening for women ages 30 years and over and women at risk for fractures or osteoporosis.  Skin self-exam. / Monthly.  Influenza vaccine. / Every year.  Tetanus, diphtheria, and acellular pertussis (Tdap/Td)  vaccine.** / 1 dose of Td every 10 years.  Varicella vaccine.** / Consult your health care provider.  Zoster vaccine.** / 1 dose for adults aged 35 years or older.  Pneumococcal 13-valent conjugate (PCV13) vaccine.** / Consult your health care provider.  Pneumococcal polysaccharide (PPSV23) vaccine.** / 1 dose for all adults aged 46 years and older.  Meningococcal vaccine.** / Consult your health care provider.  Hepatitis A vaccine.** / Consult your health care provider.  Hepatitis B vaccine.** / Consult your health care provider.  Haemophilus influenzae type b (Hib) vaccine.** / Consult your health care provider. ** Family history and personal history of risk and conditions may change your health care provider's recommendations.   This information is not intended to replace advice given to you by your health care provider. Make sure you discuss any questions you have with your health care provider.   Document Released: 05/04/2001 Document Revised: 03/29/2014 Document Reviewed: 08/03/2010 Elsevier Interactive Patient Education [employer].

## 2015-11-18 NOTE — Progress Notes (Signed)
Pre visit review using our clinic review tool, if applicable. No additional management support is needed unless otherwise documented below in the visit note. 

## 2015-11-19 ENCOUNTER — Encounter: Payer: Self-pay | Admitting: Family Medicine

## 2015-11-19 LAB — CYTOLOGY - PAP

## 2015-11-19 MED ORDER — RANITIDINE HCL 150 MG PO TABS: 150.0000 mg | ORAL_TABLET | Freq: Two times a day (BID) | ORAL | 11 refills | Status: DC

## 2015-11-20 ENCOUNTER — Encounter: Payer: Self-pay | Admitting: Medical

## 2015-11-20 ENCOUNTER — Ambulatory Visit (INDEPENDENT_AMBULATORY_CARE_PROVIDER_SITE_OTHER): Payer: Medicare Other | Admitting: Medical

## 2015-11-20 VITALS — BP 130/84 | HR 77 | Temp 98.2°F | Ht 65.0 in | Wt 157.4 lb

## 2015-11-20 DIAGNOSIS — R82998 Other abnormal findings in urine: Secondary | ICD-10-CM

## 2015-11-20 DIAGNOSIS — R3915 Urgency of urination: Secondary | ICD-10-CM

## 2015-11-20 DIAGNOSIS — N390 Urinary tract infection, site not specified: Secondary | ICD-10-CM | POA: Diagnosis not present

## 2015-11-20 LAB — POCT URINALYSIS DIPSTICK
BILIRUBIN UA: NEGATIVE
GLUCOSE UA: NEGATIVE
Ketones, UA: NEGATIVE
NITRITE UA: NEGATIVE
PH UA: 7.5
Protein, UA: NEGATIVE
RBC UA: NEGATIVE
Spec Grav, UA: 1.015
UROBILINOGEN UA: 4

## 2015-11-20 MED ORDER — PHENAZOPYRIDINE HCL 100 MG PO TABS: 100.0000 mg | ORAL_TABLET | Freq: Three times a day (TID) | ORAL | 0 refills | Status: DC | PRN

## 2015-11-20 MED ORDER — CEPHALEXIN 500 MG PO CAPS: 500.0000 mg | ORAL_CAPSULE | Freq: Two times a day (BID) | ORAL | 0 refills | Status: DC

## 2015-11-20 MED FILL — CEPHALEXIN 500 MG CAPSULE: 500 | 7 days supply | Qty: 14 | Fill #0

## 2015-11-20 NOTE — Patient Instructions (Addendum)
You appear to have a urinary tract infection. I am prescribing keflex antibiotic for the probable infection. Hydrate well. I am sending out a urine culture. During the interim if your signs and symptoms worsen rather than improving please notify us. We will notify your when the culture results are back.  For pain on urination pyridium rx.  Follow up in 7 days or as needed.

## 2015-11-20 NOTE — Progress Notes (Signed)
Pre visit review using our clinic tool,if applicable. No additional management support is needed unless otherwise documented below in the visit note.  

## 2015-11-20 NOTE — Progress Notes (Signed)
Subjective:    Patient ID: Kristen Schultz, female    DOB: December 18, 1945, 70 y.o.   MRN: [id number]  HPI  Pt in with some uti type symptoms.  Pt in today reporting urinary symptoms for 2 days.  Dysuria- yes Frequent urination-yes Hesitancy-yes Suprapubic pressure-yes Fever-no chills-mild on tuesday Nausea-yes Vomiting-no CVA pain-no History of UTI- about 4 in one year in past. Some frequent uti this years. Gross hematuria-no.   Review of Systems  Constitutional: Positive for chills. Negative for fatigue and fever.       2 day ago chill.  Respiratory: Negative for cough, chest tightness, shortness of breath and wheezing.   Cardiovascular: Negative for chest pain and palpitations.  Gastrointestinal: Negative for abdominal distention, abdominal pain, blood in stool, constipation and diarrhea.  Genitourinary: Positive for dysuria, frequency and urgency. Negative for decreased urine volume, hematuria, vaginal bleeding and vaginal discharge.  Musculoskeletal: Negative for back pain.  Skin: Negative for rash.  Neurological: Negative for dizziness, light-headedness and headaches.  Hematological: Negative for adenopathy. Does not bruise/bleed easily.   Past Medical History:  Diagnosis Date  . Anxiety   . Hyperlipidemia   . Hypertension      Social History   Social History  . Marital status: Married    Spouse name: N/A  . Number of children: N/A  . Years of education: N/A   Occupational History  . Not on file.   Social History Main Topics  . Smoking status: Former Smoker    Packs/day: 0.30    Years: 15.00    Quit date: 11/17/1985  . Smokeless tobacco: Never Used  . Alcohol use No  . Drug use: No  . Sexual activity: Yes    Partners: Male   Other Topics Concern  . Not on file   Social History Narrative  . No narrative on file    Past Surgical History:  Procedure Laterality Date  . HEMORRHOID SURGERY    . LAPAROSCOPIC LIVER CYST REMOVAL    . OVARIAN CYST  SURGERY      Family History  Problem Relation Age of Onset  . Heart disease Neg Hx   . Glaucoma Neg Hx     Allergies  Allergen Reactions  . Bee Venom Anaphylaxis    Bee stings  . Codeine     Extreme Vomiting  . Penicillins     Redness, swelling and itching  . Tetracyclines & Related     Yeast Infection and Stomach problems    Current Outpatient Prescriptions on File Prior to Visit  Medication Sig Dispense Refill  . aspirin 81 MG tablet Take 81 mg by mouth daily.    . citalopram (CELEXA) 10 MG tablet TAKE 1 TABLET (10 MG TOTAL) BY MOUTH DAILY. 90 tablet 3  . losartan-hydrochlorothiazide (HYZAAR) 50-12.5 MG tablet Take 1 tablet by mouth daily. 90 tablet 1  . Multiple Vitamin (MULTIVITAMIN) capsule Take 1 capsule by mouth daily.    . ranitidine (ZANTAC) 150 MG tablet Take 1 tablet (150 mg total) by mouth 2 (two) times daily. 60 tablet 11  . diclofenac (VOLTAREN) 75 MG EC tablet Take 1 tablet (75 mg total) by mouth 2 (two) times daily. (Patient not taking: Reported on 11/20/2015) 30 tablet 0  . Loratadine 10 MG CAPS 1 po qd (Patient not taking: Reported on 11/20/2015) 30 each    No current facility-administered medications on file prior to visit.     BP 130/84   Pulse 77   Temp 98.2 F (  36.8 C) (Oral)   Ht 5\' 5"  (1.651 m)   Wt 157 lb 6.4 oz (71.4 kg)   SpO2 97%   BMI 26.19 kg/m       Objective:   Physical Exam  General  Mental Status- Alert. Orientation- Orientation x 4.   Skin General:- Normal. Moisture- Dry. Temperature- Warm.   Heart Ausculation-RRR  Lungs Ausculation- Clear, even, unlabored bilaterlly.    Abdomen Palpation/Percussion: Palpation and Percussion of the abdomen reveal- faint suprapubic  Tender, No Rebound tenderness, No Rigidity(guarding), No Palpable abdominal masses and No jar tenderness. No suprapubic tenderness. Liver:-Normal. Spleen:- Normal. Other Characteristics- No Costovertebral angle tenderness- Left or Costovertebral angle  tenderness- Right.  Auscultation: Auscultation of the abdomen reveals- Bowel Sounds normal.  Back- very faint possible  cva tenderness.       Assessment & Plan:  You appear to have a urinary tract infection. I am prescribing keflex antibiotic for the probable infection. Hydrate well. I am sending out a urine culture. During the interim if your signs and symptoms worsen rather than improving please notify us. We will notify your when the culture results are back.  For pain on urination pyridium rx.  Note pt does request use of keflex rather than cipro. But will change med if needed  Follow up in 7 days or as needed.

## 2015-11-21 LAB — URINE CULTURE

## 2016-01-27 ENCOUNTER — Encounter: Payer: Self-pay | Admitting: Gastroenterology

## 2016-01-27 ENCOUNTER — Ambulatory Visit (INDEPENDENT_AMBULATORY_CARE_PROVIDER_SITE_OTHER): Payer: Medicare Other | Admitting: Gastroenterology

## 2016-01-27 VITALS — BP 106/60 | HR 76 | Ht 65.0 in | Wt 160.0 lb

## 2016-01-27 DIAGNOSIS — R195 Other fecal abnormalities: Secondary | ICD-10-CM

## 2016-01-27 MED ORDER — NA SULFATE-K SULFATE-MG SULF 17.5-3.13-1.6 GM/177ML PO SOLN: 1.0000 | Freq: Once | ORAL | 0 refills | Status: AC

## 2016-01-27 NOTE — Patient Instructions (Signed)
If you are age 70 or older, your body mass index should be between 23-30. Your Body mass index is 26.63 kg/m. If this is out of the aforementioned range listed, please consider follow up with your Primary Care Provider.  If you are age 41 or younger, your body mass index should be between 19-25. Your Body mass index is 26.63 kg/m. If this is out of the aformentioned range listed, please consider follow up with your Primary Care Provider.   You have been scheduled for a colonoscopy. Please follow written instructions given to you at your visit today.  Please pick up your prep supplies at the pharmacy within the next 1-3 days. If you use inhalers (even only as needed), please bring them with you on the day of your procedure. Your physician has requested that you go to www.startemmi.com and enter the access code given to you at your visit today. This web site gives a general overview about your procedure. However, you should still follow specific instructions given to you by our office regarding your preparation for the procedure.  We have sent the following medications to your pharmacy for you to pick up at your convenience: Suprep  Thank you for choosing [facility]  Dr Wilfrid Lund III

## 2016-01-27 NOTE — Progress Notes (Signed)
Kristen Schultz 01/27/2016  Referring physician: [name], DO  Reason for consult/chief complaint: heme positive stool (per Dr Etter Sjogren; alternating constipaton and loose stools; tends to be more loose; ) and Encopresis (occasional (has had about 2-3 episodes))   Subjective  HPI:  Kristen Schultz was referred by [facility] for heme positive stool discovered on a rectal exam during an exam in late August. At that time, [name] was complaining of right-sided abdominal pain for about a week. The office note indicates that it was black tarry stool, but the patient does not recall that being the case, and the hemoglobin was also normal at that time. She also says that her stools were dark when she was drinking red wine. It has stopped since she now has 2 glasses of white wine per night. Abdominal pain she was experiencing in August are resolved after about a week and has not recurred. Her bowel habits are regular and without bright red blood per rectum. Her last colonoscopy in 2004 with Dr.orr had no polyps, but mention she had polyps on a prior exam. Is not believe she has had another colonoscopy since the 2004 exam. She typically has a bowel movement every morning, and sometimes might have a few more is the morning goes by. This has been her pattern for years.   ROS:  Review of Systems  Constitutional: Negative for appetite change and unexpected weight change.  HENT: Negative for mouth sores and voice change.   Eyes: Negative for pain and redness.  Respiratory: Negative for cough and shortness of breath.   Cardiovascular: Negative for chest pain and palpitations.  Genitourinary: Negative for dysuria and hematuria.  Musculoskeletal: Negative for arthralgias and myalgias.  Skin: Negative for pallor and rash.  Neurological: Negative for weakness and headaches.  Hematological: Negative for adenopathy.     Past Medical History: Past Medical History:   Diagnosis Date  . Anxiety   . Arthritis   . Colon polyps   . Gastric ulcer    per patient; in the 1970s  . Hyperlipidemia   . Hypertension   . Liver cyst   . Pneumonia      Past Surgical History: Past Surgical History:  Procedure Laterality Date  . HEMORRHOID SURGERY    . LAPAROSCOPIC LIVER CYST REMOVAL    . OVARIAN CYST SURGERY       Family History: Family History  Problem Relation Age of Onset  . Heart disease Father   . Heart disease Brother 2  . Heart disease Brother   . Glaucoma Neg Hx   . Colon cancer Neg Hx   . Esophageal cancer Neg Hx   . Pancreatic cancer Neg Hx   . Stomach cancer Neg Hx    No CRC   Social History: Social History   Social History  . Marital status: Married    Spouse name: N/A  . Number of children: N/A  . Years of education: N/A   Social History Main Topics  . Smoking status: Former Smoker    Packs/day: 0.30    Years: 15.00    Quit date: 11/17/1985  . Smokeless tobacco: Never Used  . Alcohol use 8.4 oz/week    14 Glasses of wine per week  . Drug use: No  . Sexual activity: Yes    Partners: Male   Other Topics Concern  . None   Social History Narrative  . None  2 glasses white wine every night  Allergies:  Allergies  Allergen Reactions  . Bee Venom Anaphylaxis    Bee stings  . Codeine     Extreme Vomiting  . Penicillins     Redness, swelling and itching  . Tetracyclines & Related     Yeast Infection and Stomach problems    Outpatient Meds: Current Outpatient Prescriptions  Medication Sig Dispense Refill  . aspirin 81 MG tablet Take 81 mg by mouth daily.    . citalopram (CELEXA) 10 MG tablet TAKE 1 TABLET (10 MG TOTAL) BY MOUTH DAILY. 90 tablet 3  . Loratadine 10 MG CAPS 1 po qd 30 each   . losartan-hydrochlorothiazide (HYZAAR) 50-12.5 MG tablet Take 1 tablet by mouth daily. 90 tablet 1  . MAGNESIUM PO Take 1 capsule by mouth daily.    . Multiple Vitamin (MULTIVITAMIN) capsule Take 1 capsule by mouth daily.     . phenazopyridine (PYRIDIUM) 100 MG tablet Take 1 tablet (100 mg total) by mouth 3 (three) times daily as needed for pain. 9 tablet 0  . Na Sulfate-K Sulfate-Mg Sulf 17.5-3.13-1.6 GM/180ML SOLN Take 1 kit by mouth once. 354 mL 0   No current facility-administered medications for this visit.       ___________________________________________________________________ Objective   Exam:  BP 106/60   Pulse 76   Ht _0  (1.651 m)   Wt 160 lb (72.6 kg)   BMI 26.63 kg/m    General: this is a(n) Well-appearing woman   Eyes: sclera anicteric, no redness  ENT: oral mucosa moist without lesions, no cervical or supraclavicular lymphadenopathy, good dentition  CV: RRR without murmur, S1/S2, no JVD, no peripheral edema  Resp: clear to auscultation bilaterally, normal RR and effort noted  GI: soft, no tenderness, with active bowel sounds. No guarding or palpable organomegaly noted. She has a right upper quadrant scar from what was an open resection of a hepatic cyst in childhood.  Skin; warm and dry, no rash or jaundice noted  Neuro: awake, alert and oriented x 3. Normal gross motor function and fluent speech  Labs:  CBC    Component Value Date/Time   WBC 8.3 11/18/2015 0958   RBC 4.46 11/18/2015 0958   HGB 14.3 11/18/2015 0958   HCT 42.4 11/18/2015 0958   PLT 301.0 11/18/2015 0958   MCV 95.2 11/18/2015 0958   MCHC 33.8 11/18/2015 0958   RDW 13.8 11/18/2015 0958   LYMPHSABS 1.4 11/18/2015 0958   MONOABS 0.7 11/18/2015 0958   EOSABS 0.2 11/18/2015 0958   BASOSABS 0.1 11/18/2015 0958   Assessment: Encounter Diagnosis  Name Primary?  . Heme positive stool Yes    She does not seem to have any chronic digestive symptoms and has not noticed any overt bleeding. Nevertheless, she is heme positive stool and has been many years since her last colonoscopy.  Plan:  Colonoscopy. She is agreeable after discussion of the procedure and risks.  The benefits and risks of the planned  procedure were described in detail with the patient or (when appropriate) their health care proxy.  Risks were outlined as including, but not limited to, bleeding, infection, perforation, adverse medication reaction leading to cardiac or pulmonary decompensation, or pancreatitis (if ERCP).  The limitation of incomplete mucosal visualization was also discussed.  No guarantees or warranties were given.   Thank you for the courtesy of this consult.  Please call me with any questions or concerns.  Nelida Meuse III  CC: [name], DO

## 2016-02-10 ENCOUNTER — Ambulatory Visit (AMBULATORY_SURGERY_CENTER): Payer: Medicare Other | Admitting: Gastroenterology

## 2016-02-10 ENCOUNTER — Encounter: Payer: Self-pay | Admitting: Gastroenterology

## 2016-02-10 VITALS — BP 112/77 | HR 57 | Temp 96.2°F | Resp 14 | Ht 65.0 in | Wt 160.0 lb

## 2016-02-10 DIAGNOSIS — R195 Other fecal abnormalities: Secondary | ICD-10-CM

## 2016-02-10 DIAGNOSIS — D123 Benign neoplasm of transverse colon: Secondary | ICD-10-CM | POA: Diagnosis not present

## 2016-02-10 HISTORY — PX: COLONOSCOPY: SHX174

## 2016-02-10 MED ORDER — SODIUM CHLORIDE 0.9 % IV SOLN: 500.0000 mL | INTRAVENOUS | Status: DC

## 2016-02-10 NOTE — Progress Notes (Signed)
Called to room to assist during endoscopic procedure.  Patient ID and intended procedure confirmed with present staff. Received instructions for my participation in the procedure from the performing physician.  

## 2016-02-10 NOTE — Op Note (Signed)
[place] Patient Name: Kristen Schultz Procedure Date: 02/10/2016 8:25 AM MRN: [id number] Endoscopist: Mallie Mussel L. Loletha Carrow , MD Age: 70 Referring MD:  Date of Birth: August 27, 1945 Gender: Female Account #: [id number] Procedure:                Colonoscopy Indications:              Heme positive stool Medicines:                Monitored Anesthesia Care Procedure:                Pre-Anesthesia Assessment:                           - Prior to the procedure, a History and Physical                            was performed, and patient medications and                            allergies were reviewed. The patient's tolerance of                            previous anesthesia was also reviewed. The risks                            and benefits of the procedure and the sedation                            options and risks were discussed with the patient.                            All questions were answered, and informed consent                            was obtained. Anticoagulants: The patient has taken                            aspirin. It was decided not to withhold this                            medication prior to the procedure. ASA Grade                            Assessment: II - A patient with mild systemic                            disease. After reviewing the risks and benefits,                            the patient was deemed in satisfactory condition to                            undergo the procedure.  After obtaining informed consent, the colonoscope                            was passed under direct vision. Throughout the                            procedure, the patient's blood pressure, pulse, and                            oxygen saturations were monitored continuously. The                            Model PCF-H190L [id number]) scope was introduced                            through the anus and advanced to the the cecum,            identified by appendiceal orifice and ileocecal                            valve. The colonoscopy was performed without                            difficulty. The patient tolerated the procedure                            well. The quality of the bowel preparation was                            excellent. The ileocecal valve, appendiceal                            orifice, and rectum were photographed. The quality                            of the bowel preparation was evaluated using the                            BBPS [facility] Bowel Preparation Scale) with scores                            of: Right Colon = 3, Transverse Colon = 3 and Left                            Colon = 3 (entire mucosa seen well with no residual                            staining, small fragments of stool or opaque                            liquid). The total BBPS score equals 9. The bowel  preparation used was SUPREP. Scope In: 8:38:24 AM Scope Out: 8:49:51 AM Scope Withdrawal Time: 0 hours 8 minutes 16 seconds  Total Procedure Duration: 0 hours 11 minutes 27 seconds  Findings:                 The digital rectal exam findings include decreased                            sphincter tone.                           A 4 mm polyp was found in the proximal transverse                            colon. The polyp was sessile. The polyp was removed                            with a cold snare. Resection and retrieval were                            complete.                           Internal hemorrhoids were found during retroflexion                            and during anoscopy. The hemorrhoids were                            medium-sized and Grade I (internal hemorrhoids that                            do not prolapse).                           Multiple large-mouthed diverticula were found in                            the left colon.                           The exam was otherwise  without abnormality on                            direct and retroflexion views. Complications:            No immediate complications. Estimated Blood Loss:     Estimated blood loss: none. Impression:               - Decreased sphincter tone found on digital rectal                            exam.                           - One 4 mm polyp in the proximal transverse colon,  removed with a cold snare. Resected and retrieved.                           - Internal hemorrhoids.                           - Diverticulosis in the left colon.                           - The examination was otherwise normal on direct                            and retroflexion views. Recommendation:           - Patient has a contact number available for                            emergencies. The signs and symptoms of potential                            delayed complications were discussed with the                            patient. Return to normal activities tomorrow.                            Written discharge instructions were provided to the                            patient.                           - Resume previous diet.                           - No aspirin, ibuprofen, naproxen, or other                            non-steroidal anti-inflammatory drugs for 3 days                            after polyp removal.                           - Await pathology results.                           - Repeat colonoscopy is recommended for                            surveillance. The colonoscopy date will be                            determined after pathology results from today's                            exam become available for review.  Devesh Monforte L. Loletha Carrow, MD 02/10/2016 8:55:49 AM This report has been signed electronically.

## 2016-02-10 NOTE — Patient Instructions (Signed)
YOU HAD AN ENDOSCOPIC PROCEDURE TODAY AT [facility]:   Refer to the procedure report that was given to you for any specific questions about what was found during the examination.  If the procedure report does not answer your questions, please call your gastroenterologist to clarify.  If you requested that your care partner not be given the details of your procedure findings, then the procedure report has been included in a sealed envelope for you to review at your convenience later.  YOU SHOULD EXPECT: Some feelings of bloating in the abdomen. Passage of more gas than usual.  Walking can help get rid of the air that was put into your GI tract during the procedure and reduce the bloating. If you had a lower endoscopy (such as a colonoscopy or flexible sigmoidoscopy) you may notice spotting of blood in your stool or on the toilet paper. If you underwent a bowel prep for your procedure, you may not have a normal bowel movement for a few days.  Please Note:  You might notice some irritation and congestion in your nose or some drainage.  This is from the oxygen used during your procedure.  There is no need for concern and it should clear up in a day or so.  SYMPTOMS TO REPORT IMMEDIATELY:   Following lower endoscopy (colonoscopy or flexible sigmoidoscopy):  Excessive amounts of blood in the stool  Significant tenderness or worsening of abdominal pains  Swelling of the abdomen that is new, acute  Fever of 100F or higher    For urgent or emergent issues, a gastroenterologist can be reached at any hour by calling [phone].   DIET:  We do recommend a small meal at first, but then you may proceed to your regular diet.  Drink plenty of fluids but you should avoid alcoholic beverages for 24 hours.  ACTIVITY:  You should plan to take it easy for the rest of today and you should NOT DRIVE or use heavy machinery until tomorrow (because of the sedation medicines used during the test).     FOLLOW UP: Our staff will call the number listed on your records the next business day following your procedure to check on you and address any questions or concerns that you may have regarding the information given to you following your procedure. If we do not reach you, we will leave a message.  However, if you are feeling well and you are not experiencing any problems, there is no need to return our call.  We will assume that you have returned to your regular daily activities without incident.  If any biopsies were taken you will be contacted by phone or by letter within the next 1-3 weeks.  Please call us at [phone] if you have not heard about the biopsies in 3 weeks.    SIGNATURES/CONFIDENTIALITY: You and/or your care partner have signed paperwork which will be entered into your electronic medical record.  These signatures attest to the fact that that the information above on your After Visit Summary has been reviewed and is understood.  Full responsibility of the confidentiality of this discharge information lies with you and/or your care-partner.   NO ASPIRIN ,IBUPROFEN,NAPROXEN,OR OTHER NON STEROIDAL ANTI INFLAMMATORY PRODUCTS FOR 3 DAYS AFTER POLYP REMOVAL.   AWAIT PATHOLOGY RESULTS  Information on polyps,diverticulosis, & hemorrhoids given to you today

## 2016-02-10 NOTE — Progress Notes (Signed)
Report to PACU, RN, vss, BBS= Clear.  

## 2016-02-11 ENCOUNTER — Telehealth: Payer: Self-pay

## 2016-02-11 NOTE — Telephone Encounter (Signed)
  Follow up Call-  Call back number 02/10/2016  Post procedure Call Back phone  # [phone]  Permission to leave phone message Yes  Some recent data might be hidden     Patient questions:  Do you have a fever, pain , or abdominal swelling? No. Pain Score  0 *  Have you tolerated food without any problems? Yes.    Have you been able to return to your normal activities? Yes.    Do you have any questions about your discharge instructions: Diet   No. Medications  No. Follow up visit  No.  Do you have questions or concerns about your Care? No.  Actions: * If pain score is 4 or above: No action needed, pain <4.

## 2016-02-17 ENCOUNTER — Encounter: Payer: Self-pay | Admitting: Gastroenterology

## 2016-02-20 ENCOUNTER — Ambulatory Visit (INDEPENDENT_AMBULATORY_CARE_PROVIDER_SITE_OTHER): Payer: Medicare Other

## 2016-02-20 DIAGNOSIS — Z23 Encounter for immunization: Secondary | ICD-10-CM

## 2016-06-09 ENCOUNTER — Ambulatory Visit (INDEPENDENT_AMBULATORY_CARE_PROVIDER_SITE_OTHER): Payer: PPO | Admitting: Medical

## 2016-06-09 ENCOUNTER — Other Ambulatory Visit (HOSPITAL_COMMUNITY)
Admission: RE | Admit: 2016-06-09 | Discharge: 2016-06-09 | Disposition: A | Discharge status: Home / Self Care | Payer: PPO | Source: Ambulatory Visit | Attending: Medical | Admitting: Medical

## 2016-06-09 ENCOUNTER — Encounter: Payer: Self-pay | Admitting: Medical

## 2016-06-09 VITALS — BP 155/78 | HR 70 | Temp 98.1°F | Resp 16 | Ht 65.0 in | Wt 160.6 lb

## 2016-06-09 DIAGNOSIS — R300 Dysuria: Secondary | ICD-10-CM

## 2016-06-09 DIAGNOSIS — I10 Essential (primary) hypertension: Secondary | ICD-10-CM | POA: Diagnosis not present

## 2016-06-09 DIAGNOSIS — N3001 Acute cystitis with hematuria: Secondary | ICD-10-CM | POA: Diagnosis not present

## 2016-06-09 LAB — POC URINALSYSI DIPSTICK (AUTOMATED)
Bilirubin, UA: NEGATIVE
Glucose, UA: NEGATIVE
KETONES UA: NEGATIVE
Leukocytes, UA: NEGATIVE
Nitrite, UA: NEGATIVE
PH UA: 6 (ref 5.0–8.0)
PROTEIN UA: NEGATIVE
SPEC GRAV UA: 1.03 (ref 1.030–1.035)
Urobilinogen, UA: NEGATIVE (ref ?–2.0)

## 2016-06-09 MED ORDER — LOSARTAN POTASSIUM-HCTZ 100-12.5 MG PO TABS: 1.0000 | ORAL_TABLET | Freq: Every day | ORAL | 0 refills | Status: DC

## 2016-06-09 MED ORDER — LOSARTAN POTASSIUM 100 MG PO TABS: 100.0000 mg | ORAL_TABLET | Freq: Every day | ORAL | 0 refills | Status: DC

## 2016-06-09 MED ORDER — CEPHALEXIN 500 MG PO CAPS: 500.0000 mg | ORAL_CAPSULE | Freq: Two times a day (BID) | ORAL | 0 refills | Status: DC

## 2016-06-09 NOTE — Patient Instructions (Addendum)
For possible uti I am prescribing keflex. Urine culture and urine ancillary studies pending.  For high bp today. Recommend cut back on caffeine, sodium and nsaid otc. Check bp daily. If your blood pressure area over 140/90. Recommend higher dose of losartan-hctz. (print rx given to pt today). Also gave rx for electronic bp cuff.  We will notify you of the culture results when they are in.   Follow up in 10 days or as needed

## 2016-06-09 NOTE — Progress Notes (Signed)
   Subjective:    Patient ID: Kristen Schultz, female    DOB: 1945/12/17, 71 y.o.   MRN: [id number]  HPI   Pt in for uti type symptoms.  Pt states she has a lot stress. She took long trip on the road driving. She speculates she may have held her urine excessively. She states she will intermittently get the urge to urinate in intermittently. She will burn a little bit last 2 days.  In the summer she had uti type symptoms and urine culture grew out multiple organisms.  No, nauseu, no vomiting, or blood in her urine.  Pt states in past for chronic uti type symptoms urologist considered giving  her premarin.  Pt when went to urologist she not sure if she ever had cystoscopy.  Pt in August did respond to keflex whn had similar symptoms. No allergic reaction when she took the keflex in summer.   Pt BP little high today. She does not have bp cuff at home. 2-3 cups coffee in am. Pt has been using some alleve recently. One tab last night.    Review of Systems  Constitutional: Negative for chills, fatigue and fever.  Respiratory: Negative for cough, shortness of breath and wheezing.   Cardiovascular: Negative for chest pain and palpitations.  Gastrointestinal: Negative for abdominal pain.  Genitourinary: Positive for dyspareunia, dysuria, frequency and urgency. Negative for difficulty urinating, flank pain and vaginal discharge.  Musculoskeletal: Negative for back pain and myalgias.  Neurological: Negative for dizziness, seizures, weakness, light-headedness and headaches.  Hematological: Negative for adenopathy. Does not bruise/bleed easily.  Psychiatric/Behavioral: Negative for agitation, behavioral problems and confusion. The patient is not nervous/anxious.        Stressed.       Objective:   Physical Exam  General Appearance- Not in acute distress.  HEENT Eyes- Scleraeral/Conjuntiva-bilat- Not Yellow. Mouth & Throat- Normal.  Chest and Lung Exam Auscultation: Breath  sounds:-Normal. Adventitious sounds:- No Adventitious sounds.  Cardiovascular Auscultation:Rythm - Regular. Heart Sounds -Normal heart sounds.  Abdomen Inspection:-Inspection Normal.  Palpation/Perucssion: Palpation and Percussion of the abdomen reveal- Non Tender, No Rebound tenderness, No rigidity(Guarding) and No Palpable abdominal masses.  Liver:-Normal.  Spleen:- Normal.   Back- no cva tenderness.  Neuro- CN III- XII grossly intact.      Assessment & Plan:  For possible uti I am prescribing keflex. Urine culture and urine ancillary studies pending.  For high bp today. Recommend cut back on caffeine, sodium and nsaid otc. Check bp daily. If your blood pressure area over 140/90. Recommend higher dose of losartan-hctz.(print rx given to pt today). Also gave rx for electronic bp cuff.  We will notify you of the culture results when they are in.   Follow up in 10 days or as needed  Kristen Schultz, Kristen Schultz, Kristen Schultz

## 2016-06-09 NOTE — Progress Notes (Signed)
Pre visit review using our clinic review tool, if applicable. No additional management support is needed unless otherwise documented below in the visit note. 

## 2016-06-12 LAB — URINE CULTURE

## 2016-06-14 ENCOUNTER — Encounter: Payer: Self-pay | Admitting: Family Medicine

## 2016-06-15 LAB — URINE CYTOLOGY ANCILLARY ONLY
Bacterial vaginitis: NEGATIVE
Candida vaginitis: NEGATIVE

## 2016-06-16 NOTE — Telephone Encounter (Signed)
Jen/ kristie-- would you be able to find the specialist at [facility] that works with transgender individuals--- this pt granddaughter is transgender and needs specialist--- just moved to area Its probably in [place]

## 2016-06-17 NOTE — Telephone Encounter (Signed)
-----   Message from Oneida Alar sent at 06/17/2016  8:17 AM EDT ----- Good morning Dr. Lorelei Pont,   Are you willing to see this patient? See note below from Dr. Etter Sjogren.  If not I will let patient know we cannot accept them at this time.  Thanks Ebony ----- Message ----- From: [name], DO Sent: 06/16/2016  10:42 AM To: Ina Kick, RN  This pt granddaughter is transgender-- female-female and is looking for a new pcp--- we are trying to find specialist at [facility] to handle the hormones for her  She also apparently is autistic and has ptsd I've spoken to Dr Ulis Rias of [place] feel comfortable taking her on And we have nothing soon---  She wants her seen quickly Would you see if Dr Edilia Bo is willing -- ?   The do want a female dr---  If she is not able See if they are willing to go to one of the other offices  Thanks

## 2016-06-17 NOTE — Telephone Encounter (Signed)
I can see her from a primary care standpoint.  However please let them know that I am not knowledgeable about hormonal therapy for transgender persons and would ask them to seek care from an appropriate specialist (endocrinology?) for this issue

## 2016-07-10 ENCOUNTER — Other Ambulatory Visit: Payer: Self-pay | Admitting: Family Medicine

## 2016-07-10 DIAGNOSIS — F4323 Adjustment disorder with mixed anxiety and depressed mood: Secondary | ICD-10-CM

## 2016-08-20 ENCOUNTER — Encounter: Payer: Self-pay | Admitting: Family Medicine

## 2016-08-20 ENCOUNTER — Ambulatory Visit (HOSPITAL_BASED_OUTPATIENT_CLINIC_OR_DEPARTMENT_OTHER)
Admission: RE | Admit: 2016-08-20 | Discharge: 2016-08-20 | Disposition: A | Discharge status: Home / Self Care | Payer: PPO | Source: Ambulatory Visit | Attending: Family Medicine | Admitting: Family Medicine

## 2016-08-20 ENCOUNTER — Ambulatory Visit (INDEPENDENT_AMBULATORY_CARE_PROVIDER_SITE_OTHER): Payer: PPO | Admitting: Family Medicine

## 2016-08-20 VITALS — BP 138/76 | HR 64 | Temp 98.5°F | Resp 16 | Ht 65.0 in | Wt 158.2 lb

## 2016-08-20 DIAGNOSIS — M25552 Pain in left hip: Secondary | ICD-10-CM | POA: Diagnosis not present

## 2016-08-20 DIAGNOSIS — M25551 Pain in right hip: Secondary | ICD-10-CM | POA: Insufficient documentation

## 2016-08-20 DIAGNOSIS — I10 Essential (primary) hypertension: Secondary | ICD-10-CM | POA: Diagnosis not present

## 2016-08-20 DIAGNOSIS — M67442 Ganglion, left hand: Secondary | ICD-10-CM | POA: Diagnosis not present

## 2016-08-20 LAB — COMPREHENSIVE METABOLIC PANEL
ALT: 13 U/L (ref 6–29)
AST: 19 U/L (ref 10–35)
Albumin: 3.9 g/dL (ref 3.6–5.1)
Alkaline Phosphatase: 51 U/L (ref 33–130)
BILIRUBIN TOTAL: 0.4 mg/dL (ref 0.2–1.2)
BUN: 15 mg/dL (ref 7–25)
CO2: 23 mmol/L (ref 20–31)
CREATININE: 0.67 mg/dL (ref 0.60–0.93)
Calcium: 9.1 mg/dL (ref 8.6–10.4)
Chloride: 103 mmol/L (ref 98–110)
GLUCOSE: 76 mg/dL (ref 65–99)
Potassium: 3.9 mmol/L (ref 3.5–5.3)
SODIUM: 138 mmol/L (ref 135–146)
Total Protein: 6.4 g/dL (ref 6.1–8.1)

## 2016-08-20 LAB — LIPID PANEL
Cholesterol: 196 mg/dL (ref <200)
HDL: 77 mg/dL (ref >50)
LDL CALC: 102 mg/dL — AB (ref <100)
Total CHOL/HDL Ratio: 2.5 Ratio (ref <5.0)
Triglycerides: 83 mg/dL (ref <150)
VLDL: 17 mg/dL (ref <30)

## 2016-08-20 NOTE — Patient Instructions (Addendum)

## 2016-08-20 NOTE — Progress Notes (Signed)
+0Patient ID: Kristen Schultz, female   DOB: 11/13/1945, 71 y.o.   MRN: [id number]     Subjective:  I acted as a Education administrator for Dr. Carollee Herter.  Guerry Bruin, [place]   Patient ID: Kristen Schultz, female    DOB: 1946/02/28, 71 y.o.   MRN: [id number]  Chief Complaint  Patient presents with  . Hypertension  . Hip Pain    both hips, right for about 6 months, left for about 2 months  . Ganglion Cyst    left ring finger    HPI  Patient is in today for follow up blood pressure.  She has been doing well on current treatment.  Checks sometimes at home, which she states it has been ok.  She has some hip pain in both hips.  The right has been bothering her for about 6 months and left hip for about 2 months now.  She walks with a womens group about 15-22 miles per week.  It started after trying couch - 5 k  And trying to do the PTI walk.   She also has a ganglion cyst on left middle finger that she would like to see if she can be referred to someone to evaluate and treat.   Patient Care Team: Carollee Herter, Alferd Apa, DO as PCP - General (Family Medicine) Otelia Sergeant, OD as Referring Physician (Optometry) Barbaraann Barthel Sharyn Lull, MD as Consulting Physician (Sports Medicine)   Past Medical History:  Diagnosis Date  . Anxiety   . Colon polyps   . Gastric ulcer    per patient; in the 1970s  . Hyperlipidemia   . Hypertension   . Liver cyst   . Ovarian cyst 1984  . Pneumonia     Past Surgical History:  Procedure Laterality Date  . DENTAL SURGERY  2000   dental implant  . HEMORRHOID SURGERY    . LAPAROSCOPIC LIVER CYST REMOVAL    . OVARIAN CYST SURGERY      Family History  Problem Relation Age of Onset  . Heart disease Father   . Heart disease Brother 46  . Heart disease Brother   . Glaucoma Neg Hx   . Colon cancer Neg Hx   . Esophageal cancer Neg Hx   . Pancreatic cancer Neg Hx   . Stomach cancer Neg Hx     Social History   Social History  . Marital status: Married    Spouse name: N/A    . Number of children: N/A  . Years of education: N/A   Occupational History  . Not on file.   Social History Main Topics  . Smoking status: Former Smoker    Packs/day: 0.30    Years: 15.00    Quit date: 11/17/1985  . Smokeless tobacco: Never Used  . Alcohol use 8.4 oz/week    14 Glasses of wine per week  . Drug use: No  . Sexual activity: Yes    Partners: Male   Other Topics Concern  . Not on file   Social History Narrative   Exercise- Walks about 15-22 miles per week with womens group.    Outpatient Medications Prior to Visit  Medication Sig Dispense Refill  . aspirin 81 MG tablet Take 81 mg by mouth daily.    . citalopram (CELEXA) 10 MG tablet TAKE 1 TABLET (10 MG TOTAL) BY MOUTH DAILY. 90 tablet 3  . Loratadine 10 MG CAPS 1 po qd 30 each   . losartan-hydrochlorothiazide (HYZAAR) 50-12.5 MG tablet Take  1 tablet by mouth daily. 90 tablet 1  . Multiple Vitamin (MULTIVITAMIN) capsule Take 1 capsule by mouth daily.    . cephALEXin (KEFLEX) 500 MG capsule Take 1 capsule (500 mg total) by mouth 2 (two) times daily. 14 capsule 0  . citalopram (CELEXA) 10 MG tablet TAKE 1 TABLET (10 MG TOTAL) BY MOUTH DAILY. 90 tablet 0  . losartan-hydrochlorothiazide (HYZAAR) 100-12.5 MG tablet Take 1 tablet by mouth daily. 30 tablet 0  . phenazopyridine (PYRIDIUM) 100 MG tablet Take 1 tablet (100 mg total) by mouth 3 (three) times daily as needed for pain. 9 tablet 0   Facility-Administered Medications Prior to Visit  Medication Dose Route Frequency Provider Last Rate Last Dose  . 0.9 %  sodium chloride infusion  500 mL Intravenous Continuous Doran Stabler, MD        Allergies  Allergen Reactions  . Bee Venom Anaphylaxis    Bee stings  . Codeine     Extreme Vomiting  . Penicillins     Redness, swelling and itching  . Tetracyclines & Related     Yeast Infection and Stomach problems    Review of Systems  Constitutional: Negative for fever and malaise/fatigue.  HENT: Negative  for congestion.   Eyes: Negative for blurred vision.  Respiratory: Negative for cough and shortness of breath.   Cardiovascular: Negative for chest pain, palpitations and leg swelling.  Gastrointestinal: Negative for vomiting.  Musculoskeletal: Negative for back pain.       Hip pain both hips Ganglion cyst left ring finger  Skin: Negative for rash.  Neurological: Negative for loss of consciousness and headaches.       Objective:    Physical Exam  Constitutional: She is oriented to person, place, and time. She appears well-developed and well-nourished. No distress.  HENT:  Head: Normocephalic and atraumatic.  Eyes: Conjunctivae and EOM are normal.  Neck: Normal range of motion. Neck supple. No JVD present. Carotid bruit is not present. No thyromegaly present.  Cardiovascular: Normal rate, regular rhythm and normal heart sounds.   No murmur heard. Pulmonary/Chest: Effort normal and breath sounds normal. No respiratory distress. She has no wheezes. She has no rales. She exhibits no tenderness.  Abdominal: Soft. Bowel sounds are normal. There is no tenderness.  Musculoskeletal: Normal range of motion. She exhibits tenderness. She exhibits no edema or deformity.       Hands: Pain only with weight bearing  No pain with sitting  Pain in groin area   Neurological: She is alert and oriented to person, place, and time.  Skin: Skin is warm and dry. She is not diaphoretic.  Psychiatric: She has a normal mood and affect. Her behavior is normal. Judgment and thought content normal.  Nursing note and vitals reviewed.   BP 138/76 (BP Location: Right Arm, Cuff Size: Normal)   Pulse 64   Temp 98.5 F (36.9 C) (Oral)   Resp 16   Ht 5' 5"  (1.651 m)   Wt 158 lb 3.2 oz (71.8 kg)   SpO2 97%   BMI 26.33 kg/m  Wt Readings from Last 3 Encounters:  08/20/16 158 lb 3.2 oz (71.8 kg)  06/09/16 160 lb 9.6 oz (72.8 kg)  02/10/16 160 lb (72.6 kg)   BP Readings from Last 3 Encounters:  08/20/16  138/76  06/09/16 (!) 155/78  02/10/16 112/77     Immunization History  Administered Date(s) Administered  . Influenza, High Dose Seasonal PF 02/20/2016  . Influenza,inj,Quad PF,36+ Mos 01/17/2015  .  Pneumococcal Conjugate-13 01/17/2015  . Pneumococcal Polysaccharide-23 06/23/2011, 04/27/2012  . Pneumococcal-Unspecified 01/26/2013  . Td 03/22/2009  . Zoster 03/22/2012    Health Maintenance  Topic Date Due  . MAMMOGRAM  01/09/2016  . INFLUENZA VACCINE  10/20/2016  . DEXA SCAN  02/10/2017  . TETANUS/TDAP  03/23/2019  . COLONOSCOPY  02/09/2021  . Hepatitis C Screening  Completed  . PNA vac Low Risk Adult  Completed    Lab Results  Component Value Date   WBC 8.3 11/18/2015   HGB 14.3 11/18/2015   HCT 42.4 11/18/2015   PLT 301.0 11/18/2015   GLUCOSE 76 08/20/2016   CHOL 196 08/20/2016   TRIG 83 08/20/2016   HDL 77 08/20/2016   LDLCALC 102 (H) 08/20/2016   ALT 13 08/20/2016   AST 19 08/20/2016   NA 138 08/20/2016   K 3.9 08/20/2016   CL 103 08/20/2016   CREATININE 0.67 08/20/2016   BUN 15 08/20/2016   CO2 23 08/20/2016   TSH 1.40 05/19/2015    Lab Results  Component Value Date   TSH 1.40 05/19/2015   Lab Results  Component Value Date   WBC 8.3 11/18/2015   HGB 14.3 11/18/2015   HCT 42.4 11/18/2015   MCV 95.2 11/18/2015   PLT 301.0 11/18/2015   Lab Results  Component Value Date   NA 138 08/20/2016   K 3.9 08/20/2016   CO2 23 08/20/2016   GLUCOSE 76 08/20/2016   BUN 15 08/20/2016   CREATININE 0.67 08/20/2016   BILITOT 0.4 08/20/2016   ALKPHOS 51 08/20/2016   AST 19 08/20/2016   ALT 13 08/20/2016   PROT 6.4 08/20/2016   ALBUMIN 3.9 08/20/2016   CALCIUM 9.1 08/20/2016   GFR 91.00 11/18/2015   Lab Results  Component Value Date   CHOL 196 08/20/2016   Lab Results  Component Value Date   HDL 77 08/20/2016   Lab Results  Component Value Date   LDLCALC 102 (H) 08/20/2016   Lab Results  Component Value Date   TRIG 83 08/20/2016   Lab Results   Component Value Date   CHOLHDL 2.5 08/20/2016   No results found for: HGBA1C       Assessment & Plan:   Problem List Items Addressed This Visit      Unprioritized   Essential hypertension - Primary    Well controlled, no changes to meds. Encouraged heart healthy diet such as the DASH diet and exercise as tolerated.       Relevant Orders   Comp Met (CMET) (Completed)   Lipid panel (Completed)    Other Visit Diagnoses    Ganglion cyst of finger of left hand       Relevant Orders   Ambulatory referral to Orthopedic Surgery   Comp Met (CMET) (Completed)   Lipid panel (Completed)   Pain of both hip joints       Relevant Orders   DG HIPS BILAT W OR W/O PELVIS 2V (Completed)   Comp Met (CMET) (Completed)   Lipid panel (Completed)      I have discontinued Ms. Hosack's phenazopyridine and cephALEXin. I am also having her maintain her multivitamin, aspirin, Loratadine, losartan-hydrochlorothiazide, citalopram, and ([place], Uva-Ursi, (UVA URSI PO)). We will continue to administer sodium chloride.  Meds ordered this encounter  Medications  . Bear[place], Uva-Ursi, (UVA URSI PO)    Sig: Take 1 tablet by mouth as needed.    CMA served as Education administrator during this visit. History, Physical and Plan performed by  medical provider. Documentation and orders reviewed and attested to.  [name], DO

## 2016-08-22 NOTE — Assessment & Plan Note (Signed)
Well controlled, no changes to meds. Encouraged heart healthy diet such as the DASH diet and exercise as tolerated.  °

## 2016-08-26 DIAGNOSIS — L821 Other seborrheic keratosis: Secondary | ICD-10-CM | POA: Diagnosis not present

## 2016-08-26 DIAGNOSIS — Z1283 Encounter for screening for malignant neoplasm of skin: Secondary | ICD-10-CM | POA: Diagnosis not present

## 2016-10-05 ENCOUNTER — Telehealth: Payer: Self-pay | Admitting: Family Medicine

## 2016-10-05 ENCOUNTER — Other Ambulatory Visit: Payer: Self-pay | Admitting: Family Medicine

## 2016-10-05 ENCOUNTER — Other Ambulatory Visit (INDEPENDENT_AMBULATORY_CARE_PROVIDER_SITE_OTHER): Payer: PPO

## 2016-10-05 DIAGNOSIS — R350 Frequency of micturition: Secondary | ICD-10-CM

## 2016-10-05 DIAGNOSIS — R300 Dysuria: Secondary | ICD-10-CM

## 2016-10-05 DIAGNOSIS — I10 Essential (primary) hypertension: Secondary | ICD-10-CM

## 2016-10-05 DIAGNOSIS — F4323 Adjustment disorder with mixed anxiety and depressed mood: Secondary | ICD-10-CM

## 2016-10-05 NOTE — Telephone Encounter (Signed)
? 

## 2016-10-05 NOTE — Telephone Encounter (Signed)
Relation to YC:XKGY Call back Bowie: [id number] - [place], [place] - [address] [place] [address] [place] [address] [phone] (Phone) [phone] (Fax)     Reason for call:  Patient requesting urine orders due to frequent urination, burning while urinating, patient states reoccurring UTI, please advise patient directly regarding orders

## 2016-10-05 NOTE — Telephone Encounter (Signed)
Last ov 08/20/2016

## 2016-10-05 NOTE — Telephone Encounter (Signed)
Patient informed and she did agree to come today/put in order/made appt. For this afternoon.

## 2016-10-06 ENCOUNTER — Other Ambulatory Visit: Payer: Self-pay | Admitting: Family Medicine

## 2016-10-06 DIAGNOSIS — F4323 Adjustment disorder with mixed anxiety and depressed mood: Secondary | ICD-10-CM

## 2016-10-06 LAB — URINALYSIS, ROUTINE W REFLEX MICROSCOPIC
Bilirubin Urine: NEGATIVE
Nitrite: NEGATIVE
PH: 6 (ref 5.0–8.0)
Total Protein, Urine: NEGATIVE
Urine Glucose: NEGATIVE
Urobilinogen, UA: 0.2 (ref 0.0–1.0)

## 2016-10-07 ENCOUNTER — Encounter: Payer: Self-pay | Admitting: Family Medicine

## 2016-10-07 ENCOUNTER — Telehealth: Payer: Self-pay | Admitting: Family Medicine

## 2016-10-07 ENCOUNTER — Other Ambulatory Visit: Payer: Self-pay | Admitting: Family Medicine

## 2016-10-07 DIAGNOSIS — R300 Dysuria: Secondary | ICD-10-CM

## 2016-10-07 LAB — URINE CULTURE

## 2016-10-07 MED ORDER — CIPROFLOXACIN HCL 250 MG PO TABS: 250.0000 mg | ORAL_TABLET | Freq: Two times a day (BID) | ORAL | 0 refills | Status: DC

## 2016-10-07 NOTE — Telephone Encounter (Signed)
error:[id number] ° °

## 2016-10-07 NOTE — Telephone Encounter (Signed)
Relation to MA:YOKH Call back Owsley: [id number] - [place], [place] - [address] [place] [address]  Reason for call:  Patient inquiring about urine results, patient requesting antibiotics stating the "urgnency" to urinate is frequent,please advise

## 2016-10-07 NOTE — Telephone Encounter (Signed)
cipro 250 mg bid x 3 Repeat urine and culture

## 2016-10-07 NOTE — Telephone Encounter (Signed)
Antibiotic sent into pharmacy Put in order for ua/urine culture Patient scheduled appt.

## 2016-10-14 ENCOUNTER — Other Ambulatory Visit (INDEPENDENT_AMBULATORY_CARE_PROVIDER_SITE_OTHER): Payer: PPO

## 2016-10-14 DIAGNOSIS — R300 Dysuria: Secondary | ICD-10-CM | POA: Diagnosis not present

## 2016-10-15 LAB — URINALYSIS
BILIRUBIN URINE: NEGATIVE
HGB URINE DIPSTICK: NEGATIVE
Ketones, ur: NEGATIVE
LEUKOCYTES UA: NEGATIVE
NITRITE: NEGATIVE
Specific Gravity, Urine: 1.025 (ref 1.000–1.030)
Total Protein, Urine: NEGATIVE
Urine Glucose: NEGATIVE
Urobilinogen, UA: 0.2 (ref 0.0–1.0)
pH: 6 (ref 5.0–8.0)

## 2016-10-15 LAB — URINE CULTURE

## 2016-10-18 DIAGNOSIS — M79641 Pain in right hand: Secondary | ICD-10-CM | POA: Diagnosis not present

## 2016-10-18 DIAGNOSIS — M65341 Trigger finger, right ring finger: Secondary | ICD-10-CM | POA: Diagnosis not present

## 2016-10-18 DIAGNOSIS — M65312 Trigger thumb, left thumb: Secondary | ICD-10-CM | POA: Diagnosis not present

## 2016-10-18 DIAGNOSIS — M67442 Ganglion, left hand: Secondary | ICD-10-CM | POA: Diagnosis not present

## 2016-11-18 DIAGNOSIS — M67442 Ganglion, left hand: Secondary | ICD-10-CM | POA: Diagnosis not present

## 2016-11-18 DIAGNOSIS — M65331 Trigger finger, right middle finger: Secondary | ICD-10-CM | POA: Diagnosis not present

## 2016-11-18 DIAGNOSIS — M65312 Trigger thumb, left thumb: Secondary | ICD-10-CM | POA: Diagnosis not present

## 2016-11-18 DIAGNOSIS — M65341 Trigger finger, right ring finger: Secondary | ICD-10-CM | POA: Diagnosis not present

## 2016-12-15 IMAGING — DX DG SHOULDER 2+V*R*
3 series · 3 of 3 positions shown · non-contrast
Comparison: None.

CLINICAL DATA: Popping and cracking of the right shoulder with
limited range of motion over the last 4-5 weeks, fell 9 months ago
on the right arm

EXAM:
RIGHT SHOULDER - 2+ VIEW

[shoulder grashey]
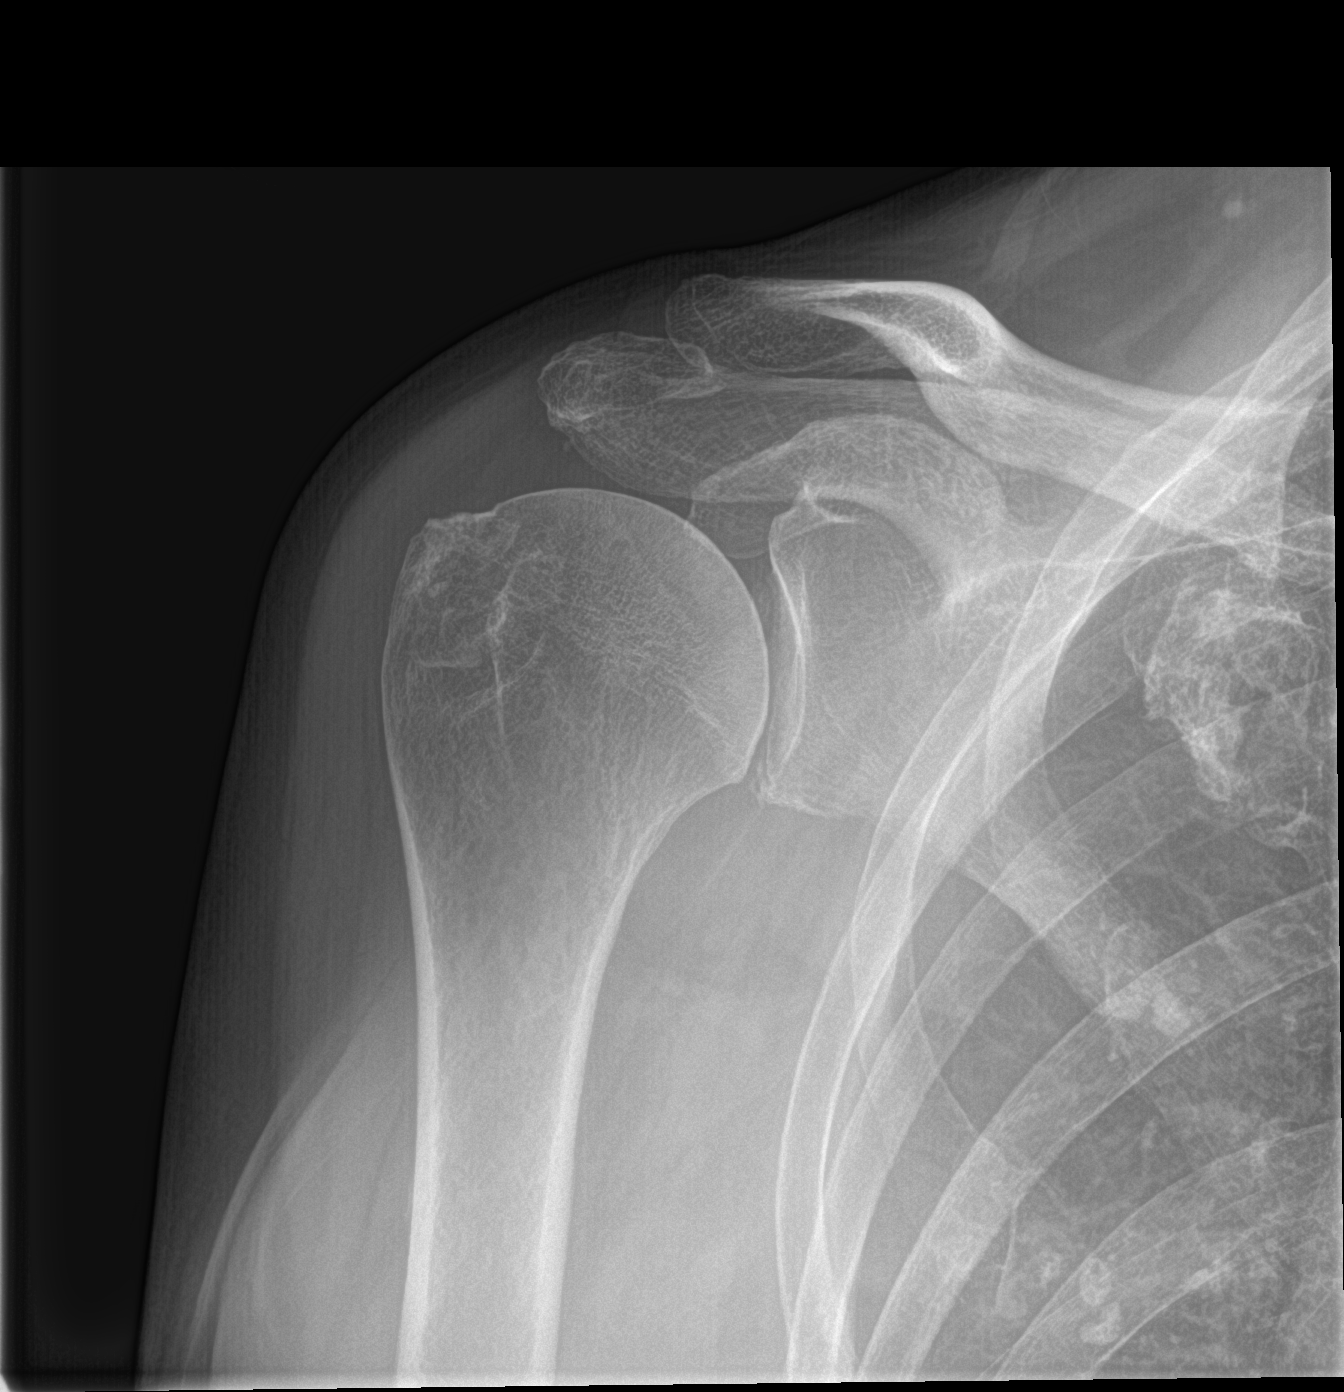

[shoulder y view]
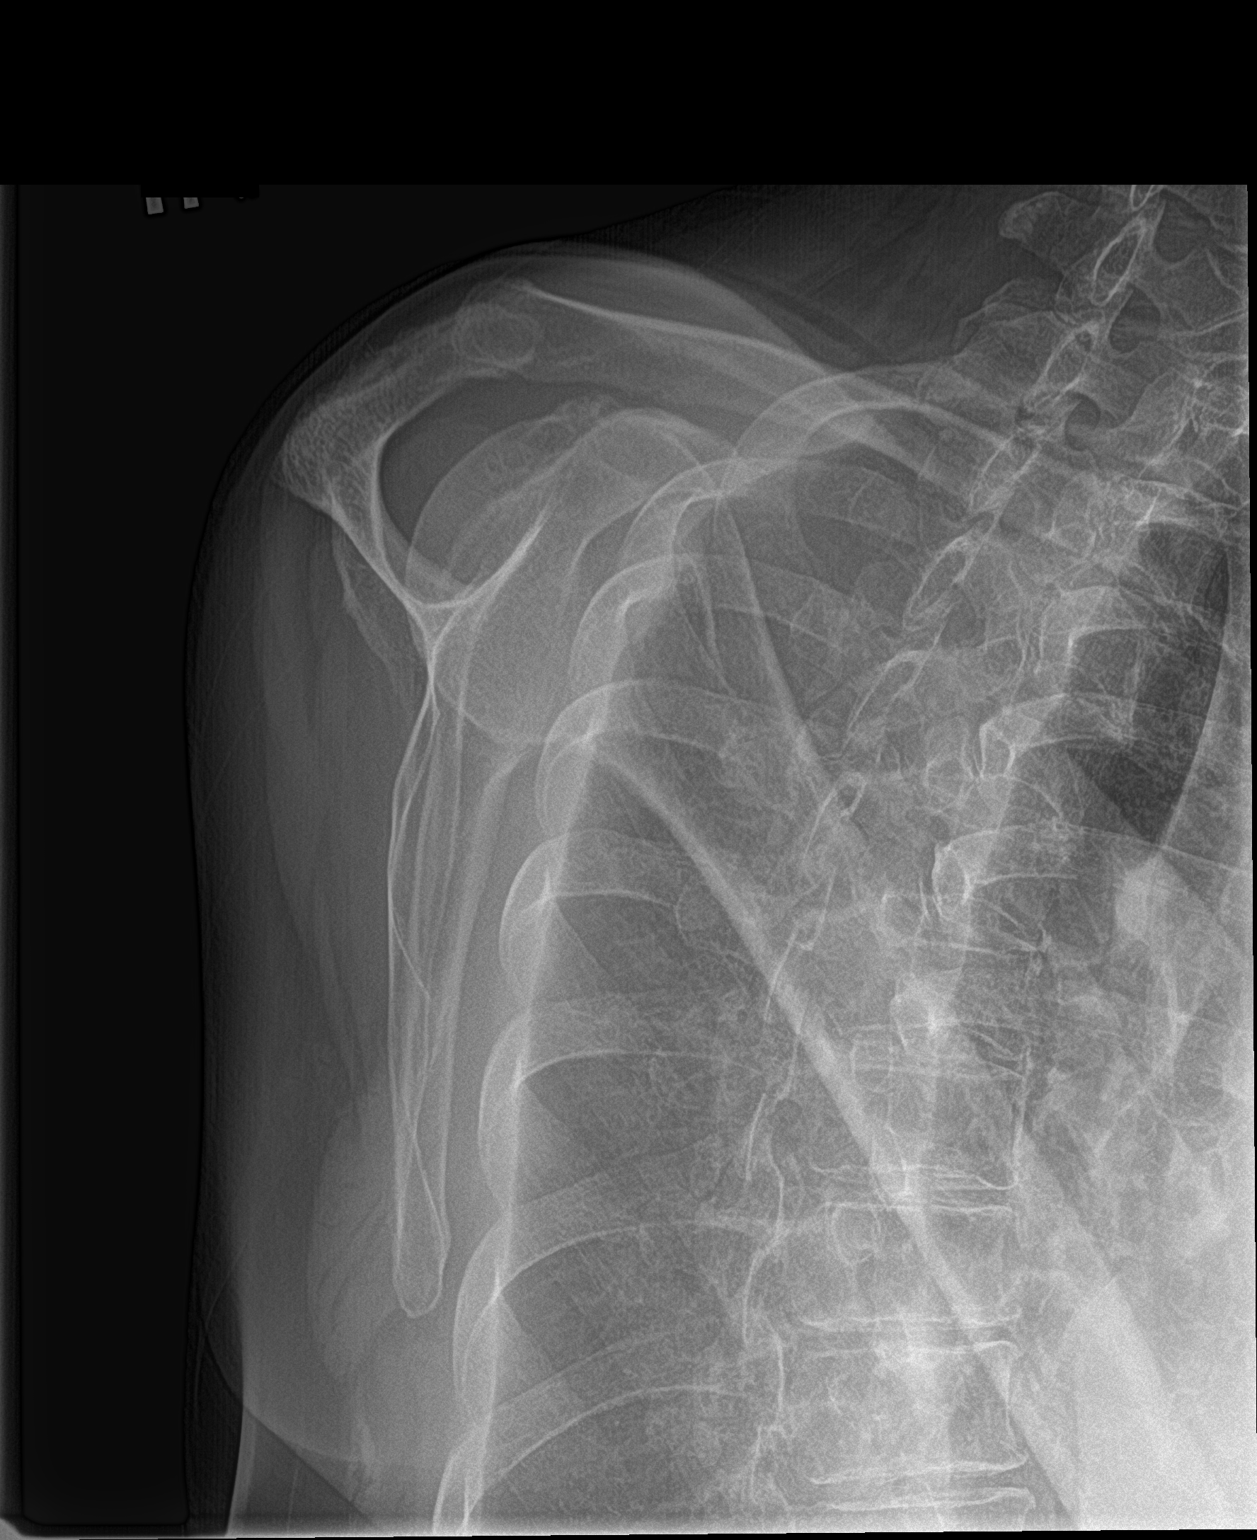

[shoulder axillary]
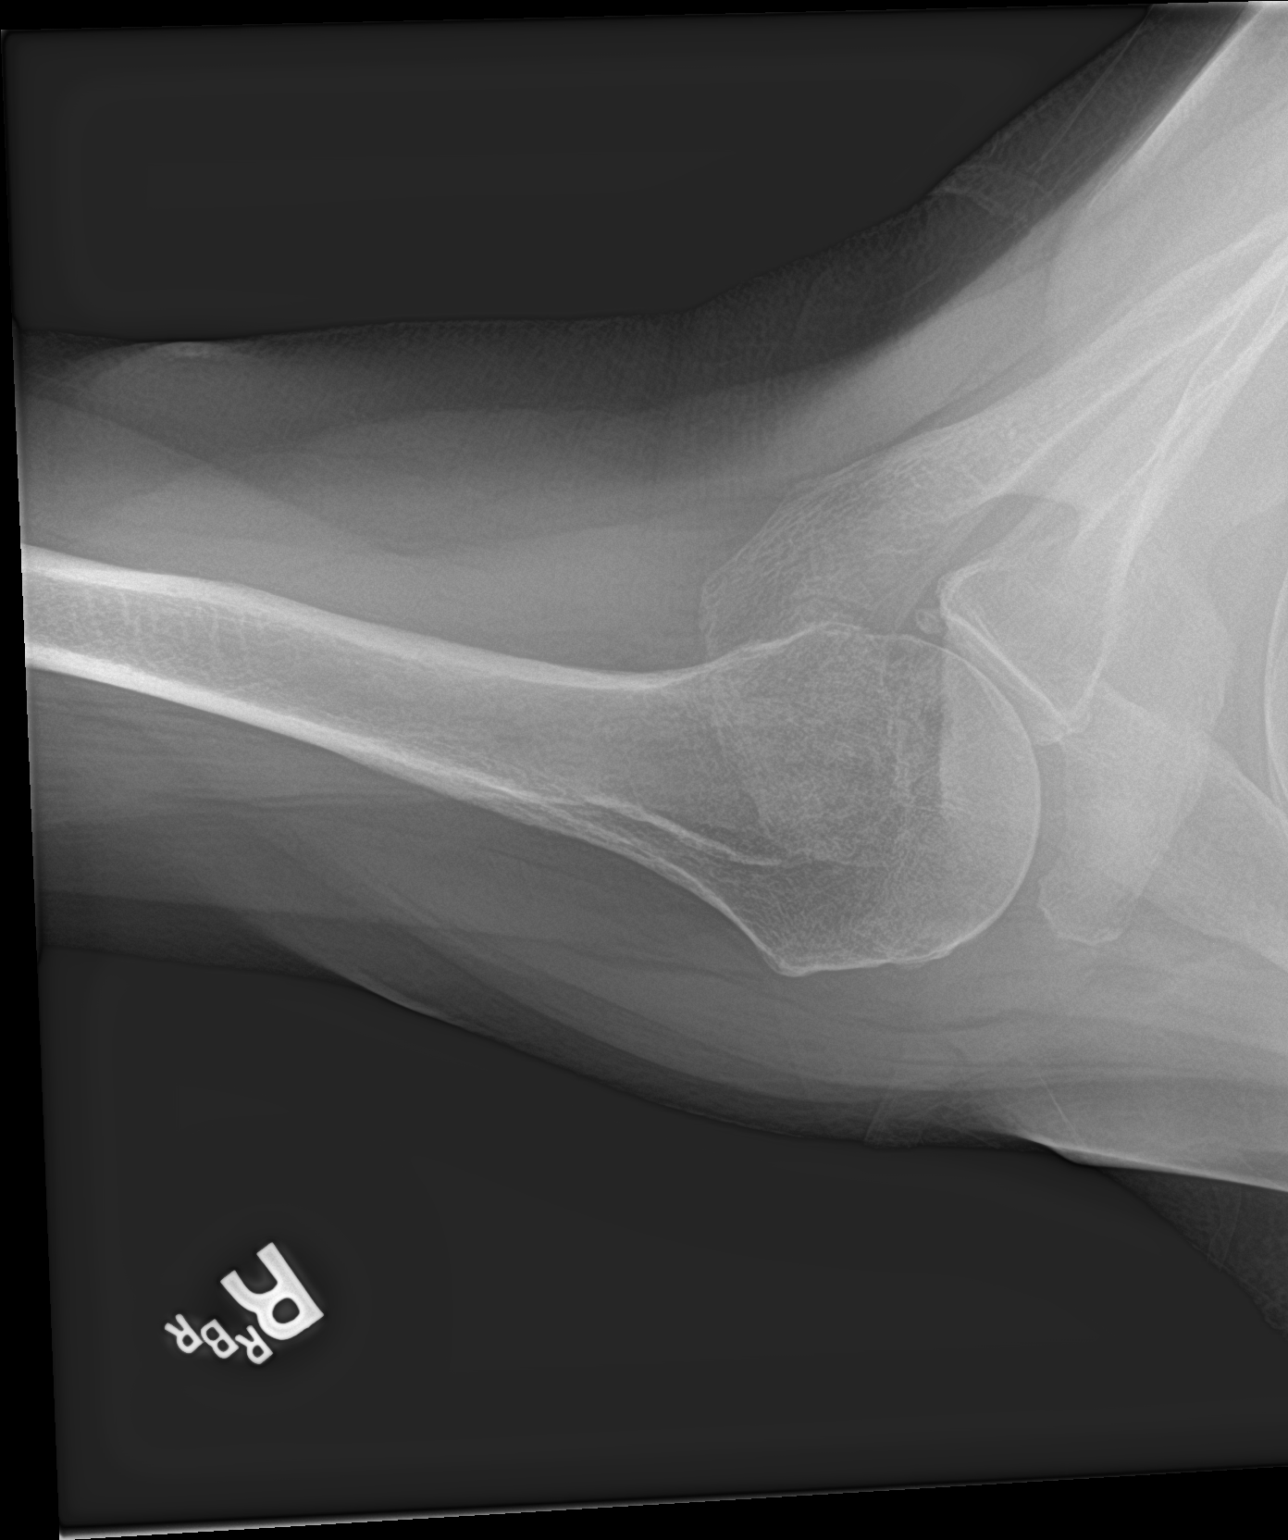

[3 of 3 positions shown; findings below may reference images not displayed]

FINDINGS: The right humeral head is in normal position and the glenohumeral
joint space appears normal. Right AC joint is normally aligned. No
acute abnormality is seen. No significant degenerative spurring is
noted.
IMPRESSION: Negative.

## 2017-01-07 ENCOUNTER — Encounter: Payer: Self-pay | Admitting: Family Medicine

## 2017-01-07 ENCOUNTER — Ambulatory Visit (INDEPENDENT_AMBULATORY_CARE_PROVIDER_SITE_OTHER): Payer: PPO | Admitting: Family Medicine

## 2017-01-07 VITALS — BP 130/90 | HR 74 | Temp 97.8°F | Ht 65.0 in | Wt 159.0 lb

## 2017-01-07 DIAGNOSIS — Z23 Encounter for immunization: Secondary | ICD-10-CM | POA: Diagnosis not present

## 2017-01-07 DIAGNOSIS — Z0001 Encounter for general adult medical examination with abnormal findings: Secondary | ICD-10-CM | POA: Diagnosis not present

## 2017-01-07 DIAGNOSIS — I498 Other specified cardiac arrhythmias: Secondary | ICD-10-CM | POA: Diagnosis not present

## 2017-01-07 DIAGNOSIS — I10 Essential (primary) hypertension: Secondary | ICD-10-CM | POA: Diagnosis not present

## 2017-01-07 DIAGNOSIS — Z0000 Encounter for general adult medical examination without abnormal findings: Secondary | ICD-10-CM

## 2017-01-07 LAB — CBC WITH DIFFERENTIAL/PLATELET
BASOS ABS: 0.1 10*3/uL (ref 0.0–0.1)
BASOS PCT: 1 % (ref 0.0–3.0)
EOS ABS: 0.1 10*3/uL (ref 0.0–0.7)
Eosinophils Relative: 1.3 % (ref 0.0–5.0)
HEMATOCRIT: 43.6 % (ref 36.0–46.0)
Hemoglobin: 14.6 g/dL (ref 12.0–15.0)
LYMPHS PCT: 17.7 % (ref 12.0–46.0)
Lymphs Abs: 1.2 10*3/uL (ref 0.7–4.0)
MCHC: 33.4 g/dL (ref 30.0–36.0)
MCV: 97.8 fl (ref 78.0–100.0)
Monocytes Absolute: 0.8 10*3/uL (ref 0.1–1.0)
Monocytes Relative: 11.3 % (ref 3.0–12.0)
Neutro Abs: 4.7 10*3/uL (ref 1.4–7.7)
Neutrophils Relative %: 68.7 % (ref 43.0–77.0)
Platelets: 294 10*3/uL (ref 150.0–400.0)
RBC: 4.46 Mil/uL (ref 3.87–5.11)
RDW: 13.5 % (ref 11.5–15.5)
WBC: 6.9 10*3/uL (ref 4.0–10.5)

## 2017-01-07 LAB — T4, FREE: Free T4: 0.88 ng/dL (ref 0.60–1.60)

## 2017-01-07 LAB — LIPID PANEL
CHOL/HDL RATIO: 3
CHOLESTEROL: 226 mg/dL — AB (ref 0–200)
HDL: 83.7 mg/dL (ref >39.00)
LDL CALC: 129 mg/dL — AB (ref 0–99)
NonHDL: 142.41
Triglycerides: 65 mg/dL (ref 0.0–149.0)
VLDL: 13 mg/dL (ref 0.0–40.0)

## 2017-01-07 LAB — COMPREHENSIVE METABOLIC PANEL
ALBUMIN: 4.1 g/dL (ref 3.5–5.2)
ALT: 17 U/L (ref 0–35)
AST: 25 U/L (ref 0–37)
Alkaline Phosphatase: 48 U/L (ref 39–117)
BILIRUBIN TOTAL: 0.8 mg/dL (ref 0.2–1.2)
BUN: 12 mg/dL (ref 6–23)
CALCIUM: 9.3 mg/dL (ref 8.4–10.5)
CO2: 31 mEq/L (ref 19–32)
CREATININE: 0.63 mg/dL (ref 0.40–1.20)
Chloride: 94 mEq/L — ABNORMAL LOW (ref 96–112)
GFR: 99.05 mL/min (ref >60.00)
Glucose, Bld: 92 mg/dL (ref 70–99)
Potassium: 4.1 mEq/L (ref 3.5–5.1)
Sodium: 133 mEq/L — ABNORMAL LOW (ref 135–145)
TOTAL PROTEIN: 7 g/dL (ref 6.0–8.3)

## 2017-01-07 LAB — TSH: TSH: 1.56 u[IU]/mL (ref 0.35–4.50)

## 2017-01-07 LAB — T3, FREE: T3 FREE: 3.7 pg/mL (ref 2.3–4.2)

## 2017-01-07 NOTE — Patient Instructions (Signed)
Preventive Care 65 Years and Older, Female Preventive care refers to lifestyle choices and visits with your health care provider that can promote health and wellness. What does preventive care include?  A yearly physical exam. This is also called an annual well check.  Dental exams once or twice a year.  Routine eye exams. Ask your health care provider how often you should have your eyes checked.  Personal lifestyle choices, including: ? Daily care of your teeth and gums. ? Regular physical activity. ? Eating a healthy diet. ? Avoiding tobacco and drug use. ? Limiting alcohol use. ? Practicing safe sex. ? Taking low-dose aspirin every day. ? Taking vitamin and mineral supplements as recommended by your health care provider. What happens during an annual well check? The services and screenings done by your health care provider during your annual well check will depend on your age, overall health, lifestyle risk factors, and family history of disease. Counseling Your health care provider may ask you questions about your:  Alcohol use.  Tobacco use.  Drug use.  Emotional well-being.  Home and relationship well-being.  Sexual activity.  Eating habits.  History of falls.  Memory and ability to understand (cognition).  Work and work environment.  Reproductive health.  Screening You may have the following tests or measurements:  Height, weight, and BMI.  Blood pressure.  Lipid and cholesterol levels. These may be checked every 5 years, or more frequently if you are over 50 years old.  Skin check.  Lung cancer screening. You may have this screening every year starting at age 55 if you have a 30-pack-year history of smoking and currently smoke or have quit within the past 15 years.  Fecal occult blood test (FOBT) of the stool. You may have this test every year starting at age 50.  Flexible sigmoidoscopy or colonoscopy. You may have a sigmoidoscopy every 5 years or  a colonoscopy every 10 years starting at age 50.  Hepatitis C blood test.  Hepatitis B blood test.  Sexually transmitted disease (STD) testing.  Diabetes screening. This is done by checking your blood sugar (glucose) after you have not eaten for a while (fasting). You may have this done every 1-3 years.  Bone density scan. This is done to screen for osteoporosis. You may have this done starting at age 65.  Mammogram. This may be done every 1-2 years. Talk to your health care provider about how often you should have regular mammograms.  Talk with your health care provider about your test results, treatment options, and if necessary, the need for more tests. Vaccines Your health care provider may recommend certain vaccines, such as:  Influenza vaccine. This is recommended every year.  Tetanus, diphtheria, and acellular pertussis (Tdap, Td) vaccine. You may need a Td booster every 10 years.  Varicella vaccine. You may need this if you have not been vaccinated.  Zoster vaccine. You may need this after age 60.  Measles, mumps, and rubella (MMR) vaccine. You may need at least one dose of MMR if you were born in 1957 or later. You may also need a second dose.  Pneumococcal 13-valent conjugate (PCV13) vaccine. One dose is recommended after age 65.  Pneumococcal polysaccharide (PPSV23) vaccine. One dose is recommended after age 65.  Meningococcal vaccine. You may need this if you have certain conditions.  Hepatitis A vaccine. You may need this if you have certain conditions or if you travel or work in places where you may be exposed to hepatitis   A.  Hepatitis B vaccine. You may need this if you have certain conditions or if you travel or work in places where you may be exposed to hepatitis B.  Haemophilus influenzae type b (Hib) vaccine. You may need this if you have certain conditions.  Talk to your health care provider about which screenings and vaccines you need and how often you  need them. This information is not intended to replace advice given to you by your health care provider. Make sure you discuss any questions you have with your health care provider. Document Released: 04/04/2015 Document Revised: 11/26/2015 Document Reviewed: 01/07/2015 Elsevier Interactive Patient Education  2017 [employer].

## 2017-01-07 NOTE — Progress Notes (Signed)
Subjective:     Kristen Schultz is a 71 y.o. female and is here for a comprehensive physical exam. The patient reports problems - fluttering in chest daily -- few times a day ,  no sob, no chest pain .  Social History   Social History  . Marital status: Married    Spouse name: N/A  . Number of children: N/A  . Years of education: N/A   Occupational History  .      retired   Social History Main Topics  . Smoking status: Former Smoker    Packs/day: 0.30    Years: 15.00    Quit date: 11/17/1985  . Smokeless tobacco: Never Used  . Alcohol use 8.4 oz/week    14 Glasses of wine per week  . Drug use: No  . Sexual activity: Yes    Partners: Male   Other Topics Concern  . Not on file   Social History Narrative   Exercise- Walks about 15-22 miles per week with womens group.   Health Maintenance  Topic Date Due  . MAMMOGRAM  01/09/2016  . DEXA SCAN  02/10/2017  . TETANUS/TDAP  03/23/2019  . COLONOSCOPY  02/09/2021  . INFLUENZA VACCINE  Completed  . Hepatitis C Screening  Completed  . PNA vac Low Risk Adult  Completed    The following portions of the patient's history were reviewed and updated as appropriate:  She  has a past medical history of Anxiety; Colon polyps; Gastric ulcer; Hyperlipidemia; Hypertension; Liver cyst; Ovarian cyst (1984); and Pneumonia. She  does not have any pertinent problems on file. She  has a past surgical history that includes Hemorrhoid surgery; Laparoscopic liver cyst removal; Ovarian cyst surgery; and Dental surgery (2000). Her family history includes Heart disease in her brother and father; Heart disease (age of onset: 29) in her brother. She  reports that she quit smoking about 31 years ago. She has a 4.50 pack-year smoking history. She has never used smokeless tobacco. She reports that she drinks about 8.4 oz of alcohol per week . She reports that she does not use drugs. She has a current medication list which includes the following  prescription(s): aspirin, bearberry (uva-ursi), citalopram, loratadine, losartan-hydrochlorothiazide, and multivitamin, and the following Facility-Administered Medications: sodium chloride. Current Outpatient Prescriptions on File Prior to Visit  Medication Sig Dispense Refill  . aspirin 81 MG tablet Take 81 mg by mouth daily.    Cleotis Lema, Uva-Ursi, (UVA URSI PO) Take 1 tablet by mouth as needed.    . citalopram (CELEXA) 10 MG tablet TAKE 1 TABLET (10 MG TOTAL) BY MOUTH DAILY. 90 tablet 0  . Loratadine 10 MG CAPS 1 po qd 30 each   . losartan-hydrochlorothiazide (HYZAAR) 50-12.5 MG tablet TAKE 1 TABLET BY MOUTH EVERY DAY 90 tablet 1  . Multiple Vitamin (MULTIVITAMIN) capsule Take 1 capsule by mouth daily.     Current Facility-Administered Medications on File Prior to Visit  Medication Dose Route Frequency Provider Last Rate Last Dose  . 0.9 %  sodium chloride infusion  500 mL Intravenous Continuous Nelida Meuse III, MD       She is allergic to bee venom; codeine; penicillins; and tetracyclines & related..  Review of Systems Review of Systems  Constitutional: Negative for activity change, appetite change and fatigue.  HENT: Negative for hearing loss, congestion, tinnitus and ear discharge.  dentist q83m Eyes: Negative for visual disturbance (see optho q1y -- vision corrected to 20/20 with glasses).  Respiratory: Negative  for cough, chest tightness and shortness of breath.   Cardiovascular: Negative for chest pain,  and leg swelling. + palpitations Gastrointestinal: Negative for abdominal pain, diarrhea, constipation and abdominal distention.  Genitourinary: Negative for urgency, frequency, decreased urine volume and difficulty urinating.  Musculoskeletal: Negative for back pain, arthralgias and gait problem.  Skin: Negative for color change, pallor and rash.  Neurological: Negative for dizziness, light-headedness, numbness and headaches.  Hematological: Negative for adenopathy. Does not  bruise/bleed easily.  Psychiatric/Behavioral: Negative for suicidal ideas, confusion, sleep disturbance, self-injury, dysphoric mood, decreased concentration and agitation.       Objective:    BP 130/90   Pulse 74   Temp 97.8 F (36.6 C) (Oral)   Ht 5\' 5"  (1.651 m)   Wt 159 lb (72.1 kg)   SpO2 98%   BMI 26.46 kg/m  General appearance: alert, cooperative, appears stated age and no distress Head: Normocephalic, without obvious abnormality, atraumatic Eyes: conjunctivae/corneas clear. PERRL, EOM's intact. Fundi benign. Ears: normal TM's and external ear canals both ears Nose: Nares normal. Septum midline. Mucosa normal. No drainage or sinus tenderness. Throat: lips, mucosa, and tongue normal; teeth and gums normal Neck: no adenopathy, no carotid bruit, no JVD, supple, symmetrical, trachea midline and thyroid not enlarged, symmetric, no tenderness/mass/nodules Back: symmetric, no curvature. ROM normal. No CVA tenderness. Lungs: clear to auscultation bilaterally Breasts: normal appearance, no masses or tenderness Heart: regular rate and rhythm, S1, S2 normal, no murmur, click, rub or gallop Abdomen: soft, non-tender; bowel sounds normal; no masses,  no organomegaly Pelvic: not indicated; post-menopausal, no abnormal Pap smears in past Extremities: extremities normal, atraumatic, no cyanosis or edema Pulses: 2+ and symmetric Skin: Skin color, texture, turgor normal. No rashes or lesions Lymph nodes: Cervical, supraclavicular, and axillary nodes normal. Neurologic: Alert and oriented X 3, normal strength and tone. Normal symmetric reflexes. Normal coordination and gait    Assessment:    Healthy female exam.      Plan:    ghm utd Check labs See After Visit Summary for Counseling Recommendations    1. Need for influenza vaccination  - Flu vaccine HIGH DOSE PF (Fluzone High dose)  2. Need for immunization against influenza   3. Periodic heart flutter ekg normal  - EKG  12-Lead - Lipid panel - CBC with Differential/Platelet - Comprehensive metabolic panel - TSH - POCT Urinalysis Dipstick (Automated) - T3, free - T4, free - ECHOCARDIOGRAM COMPLETE; Future  4. Preventative health care See above  5. Essential hypertension Well controlled, no changes to meds. Encouraged heart healthy diet such as the DASH diet and exercise as tolerated.  - Lipid panel - CBC with Differential/Platelet - Comprehensive metabolic panel - POCT Urinalysis Dipstick (Automated) - T3, free - T4, free

## 2017-01-10 ENCOUNTER — Other Ambulatory Visit: Payer: Self-pay | Admitting: Family Medicine

## 2017-01-10 DIAGNOSIS — F4323 Adjustment disorder with mixed anxiety and depressed mood: Secondary | ICD-10-CM

## 2017-01-11 ENCOUNTER — Other Ambulatory Visit (HOSPITAL_BASED_OUTPATIENT_CLINIC_OR_DEPARTMENT_OTHER): Payer: PPO

## 2017-01-11 ENCOUNTER — Other Ambulatory Visit: Payer: Self-pay | Admitting: Emergency Medicine

## 2017-01-11 DIAGNOSIS — F4323 Adjustment disorder with mixed anxiety and depressed mood: Secondary | ICD-10-CM

## 2017-01-11 MED ORDER — CITALOPRAM HYDROBROMIDE 10 MG PO TABS: ORAL_TABLET | ORAL | 0 refills | Status: DC

## 2017-01-17 ENCOUNTER — Other Ambulatory Visit: Payer: Self-pay | Admitting: Orthopedic Surgery

## 2017-01-17 DIAGNOSIS — M7989 Other specified soft tissue disorders: Secondary | ICD-10-CM | POA: Diagnosis not present

## 2017-01-17 DIAGNOSIS — D481 Neoplasm of uncertain behavior of connective and other soft tissue: Secondary | ICD-10-CM | POA: Diagnosis not present

## 2017-01-19 ENCOUNTER — Telehealth: Payer: Self-pay

## 2017-01-19 ENCOUNTER — Telehealth: Payer: Self-pay | Admitting: Family Medicine

## 2017-01-19 NOTE — Telephone Encounter (Signed)
Patient returning call from today. Please call patient back.

## 2017-01-19 NOTE — Telephone Encounter (Signed)
Called to inform Pt of labs. No answer. Left VM to call office back.

## 2017-01-24 ENCOUNTER — Other Ambulatory Visit: Payer: Self-pay | Admitting: Family Medicine

## 2017-01-24 DIAGNOSIS — E785 Hyperlipidemia, unspecified: Secondary | ICD-10-CM

## 2017-02-04 ENCOUNTER — Ambulatory Visit (HOSPITAL_BASED_OUTPATIENT_CLINIC_OR_DEPARTMENT_OTHER)
Admission: RE | Admit: 2017-02-04 | Discharge: 2017-02-04 | Disposition: A | Discharge status: Home / Self Care | Payer: PPO | Source: Ambulatory Visit | Attending: Family Medicine | Admitting: Family Medicine

## 2017-02-04 DIAGNOSIS — E785 Hyperlipidemia, unspecified: Secondary | ICD-10-CM | POA: Diagnosis not present

## 2017-02-04 DIAGNOSIS — I10 Essential (primary) hypertension: Secondary | ICD-10-CM | POA: Insufficient documentation

## 2017-02-04 DIAGNOSIS — I081 Rheumatic disorders of both mitral and tricuspid valves: Secondary | ICD-10-CM | POA: Diagnosis not present

## 2017-02-04 DIAGNOSIS — I498 Other specified cardiac arrhythmias: Secondary | ICD-10-CM

## 2017-02-04 NOTE — Progress Notes (Signed)
  Echocardiogram 2D Echocardiogram has been performed.  Kristen Schultz 02/04/2017, 9:20 AM

## 2017-02-09 ENCOUNTER — Telehealth: Payer: Self-pay

## 2017-02-09 NOTE — Telephone Encounter (Deleted)
-----   Message from [name], DO sent at 02/05/2017  3:16 PM EST ----- Mild -  Mod murmur  If symptoms persist refer to cards-- Dr Bettina Gavia upstairs

## 2017-02-14 ENCOUNTER — Telehealth: Payer: Self-pay | Admitting: Family Medicine

## 2017-02-14 NOTE — Telephone Encounter (Signed)
Copied from [place] [id number]. Topic: Medicare AWV >> Feb 14, 2017  4:11 PM Gerrie Nordmann wrote: Reason for CRM: Left message for patient to call back and schedule Medicare Annual Wellness Visit (AWV).  Last AWV 11/18/2015; please schedule on or after 11/17/2016 with Naaman Plummer at [facility]).   OK for P[facility] to schedule. SF

## 2017-02-14 NOTE — Telephone Encounter (Signed)
Error

## 2017-02-16 ENCOUNTER — Encounter: Payer: Self-pay | Admitting: Family Medicine

## 2017-03-31 ENCOUNTER — Other Ambulatory Visit: Payer: Self-pay | Admitting: Family Medicine

## 2017-03-31 DIAGNOSIS — I10 Essential (primary) hypertension: Secondary | ICD-10-CM

## 2017-03-31 DIAGNOSIS — F4323 Adjustment disorder with mixed anxiety and depressed mood: Secondary | ICD-10-CM

## 2017-05-06 ENCOUNTER — Other Ambulatory Visit: Payer: PPO

## 2017-05-11 ENCOUNTER — Other Ambulatory Visit: Payer: PPO

## 2017-05-16 ENCOUNTER — Other Ambulatory Visit (INDEPENDENT_AMBULATORY_CARE_PROVIDER_SITE_OTHER): Payer: PPO

## 2017-05-16 DIAGNOSIS — E785 Hyperlipidemia, unspecified: Secondary | ICD-10-CM | POA: Diagnosis not present

## 2017-05-16 LAB — COMPREHENSIVE METABOLIC PANEL
ALBUMIN: 4 g/dL (ref 3.5–5.2)
ALK PHOS: 48 U/L (ref 39–117)
ALT: 14 U/L (ref 0–35)
AST: 22 U/L (ref 0–37)
BUN: 12 mg/dL (ref 6–23)
CO2: 31 mEq/L (ref 19–32)
Calcium: 9.7 mg/dL (ref 8.4–10.5)
Chloride: 100 mEq/L (ref 96–112)
Creatinine, Ser: 0.7 mg/dL (ref 0.40–1.20)
GFR: 87.63 mL/min (ref >60.00)
Glucose, Bld: 86 mg/dL (ref 70–99)
POTASSIUM: 4.9 meq/L (ref 3.5–5.1)
Sodium: 137 mEq/L (ref 135–145)
TOTAL PROTEIN: 6.7 g/dL (ref 6.0–8.3)
Total Bilirubin: 0.5 mg/dL (ref 0.2–1.2)

## 2017-05-16 LAB — LIPID PANEL
CHOLESTEROL: 214 mg/dL — AB (ref 0–200)
HDL: 79.5 mg/dL (ref >39.00)
LDL Cholesterol: 120 mg/dL — ABNORMAL HIGH (ref 0–99)
NonHDL: 134.62
Total CHOL/HDL Ratio: 3
Triglycerides: 73 mg/dL (ref 0.0–149.0)
VLDL: 14.6 mg/dL (ref 0.0–40.0)

## 2017-05-19 DIAGNOSIS — H2513 Age-related nuclear cataract, bilateral: Secondary | ICD-10-CM | POA: Diagnosis not present

## 2017-07-28 DIAGNOSIS — R600 Localized edema: Secondary | ICD-10-CM | POA: Diagnosis not present

## 2017-10-23 ENCOUNTER — Other Ambulatory Visit: Payer: Self-pay | Admitting: Family Medicine

## 2017-10-23 DIAGNOSIS — F4323 Adjustment disorder with mixed anxiety and depressed mood: Secondary | ICD-10-CM

## 2017-10-23 DIAGNOSIS — I10 Essential (primary) hypertension: Secondary | ICD-10-CM

## 2017-10-24 ENCOUNTER — Other Ambulatory Visit: Payer: Self-pay | Admitting: Family Medicine

## 2017-10-24 DIAGNOSIS — F4323 Adjustment disorder with mixed anxiety and depressed mood: Secondary | ICD-10-CM

## 2017-11-07 ENCOUNTER — Telehealth: Payer: Self-pay | Admitting: Family Medicine

## 2017-11-07 DIAGNOSIS — F4323 Adjustment disorder with mixed anxiety and depressed mood: Secondary | ICD-10-CM

## 2017-11-07 NOTE — Telephone Encounter (Signed)
2 week supply of citalopram sent to the pharmacy. Pt was due for follow up with dr Carollee Herter in April and is past due. Please call pt to schedule appt before further medication is needed. Thanks!

## 2017-11-08 NOTE — Telephone Encounter (Signed)
Spoke with husband and he will have pt call us to set up appt.

## 2017-11-17 ENCOUNTER — Ambulatory Visit (INDEPENDENT_AMBULATORY_CARE_PROVIDER_SITE_OTHER): Payer: PPO | Admitting: Family Medicine

## 2017-11-17 ENCOUNTER — Encounter: Payer: Self-pay | Admitting: Family Medicine

## 2017-11-17 ENCOUNTER — Other Ambulatory Visit: Payer: Self-pay | Admitting: Family Medicine

## 2017-11-17 VITALS — BP 137/52 | HR 64 | Temp 98.4°F | Resp 16 | Ht 65.0 in | Wt 157.8 lb

## 2017-11-17 DIAGNOSIS — I10 Essential (primary) hypertension: Secondary | ICD-10-CM | POA: Diagnosis not present

## 2017-11-17 DIAGNOSIS — F411 Generalized anxiety disorder: Secondary | ICD-10-CM | POA: Diagnosis not present

## 2017-11-17 DIAGNOSIS — F4323 Adjustment disorder with mixed anxiety and depressed mood: Secondary | ICD-10-CM

## 2017-11-17 DIAGNOSIS — M8580 Other specified disorders of bone density and structure, unspecified site: Secondary | ICD-10-CM

## 2017-11-17 DIAGNOSIS — Z1231 Encounter for screening mammogram for malignant neoplasm of breast: Secondary | ICD-10-CM

## 2017-11-17 MED ORDER — CITALOPRAM HYDROBROMIDE 10 MG PO TABS: ORAL_TABLET | ORAL | 1 refills | Status: DC

## 2017-11-17 MED ORDER — LOSARTAN POTASSIUM-HCTZ 50-12.5 MG PO TABS: 1.0000 | ORAL_TABLET | Freq: Every day | ORAL | 1 refills | Status: DC

## 2017-11-17 NOTE — Patient Instructions (Signed)

## 2017-11-17 NOTE — Progress Notes (Signed)
Patient ID: Kristen Schultz, female    DOB: 1945/08/05  Age: 72 y.o. MRN: [id number]    Subjective:  Subjective  HPI [name] presents for f/u bp and anxiety.  She is doing well -- no complaints.    Review of Systems  Constitutional: Negative for activity change, appetite change, chills, diaphoresis, fatigue, fever and unexpected weight change.  HENT: Negative for congestion and hearing loss.   Eyes: Negative for pain, discharge, redness and visual disturbance.  Respiratory: Negative for cough, chest tightness, shortness of breath and wheezing.   Cardiovascular: Negative for chest pain, palpitations and leg swelling.  Gastrointestinal: Negative for abdominal distention, abdominal pain, blood in stool, constipation, diarrhea, nausea and vomiting.  Endocrine: Negative for cold intolerance, heat intolerance, polydipsia, polyphagia and polyuria.  Genitourinary: Negative for difficulty urinating, dyspareunia, dysuria, flank pain, frequency, genital sores, hematuria, menstrual problem, pelvic pain, urgency, vaginal discharge and vaginal pain.  Musculoskeletal: Negative for back pain and myalgias.  Skin: Negative for rash.  Allergic/Immunologic: Negative for environmental allergies.  Neurological: Negative for dizziness, weakness, light-headedness, numbness and headaches.  Hematological: Does not bruise/bleed easily.  Psychiatric/Behavioral: Negative for suicidal ideas. The patient is not nervous/anxious.     History Past Medical History:  Diagnosis Date  . Anxiety   . Colon polyps   . Gastric ulcer    per patient; in the 1970s  . Hyperlipidemia   . Hypertension   . Liver cyst   . Ovarian cyst 1984  . Pneumonia     She has a past surgical history that includes Hemorrhoid surgery; Laparoscopic liver cyst removal; Ovarian cyst surgery; and Dental surgery (2000).   Her family history includes Heart disease in her brother and father; Heart disease (age of onset: 11) in her  brother.She reports that she quit smoking about 32 years ago. She has a 4.50 pack-year smoking history. She has never used smokeless tobacco. She reports that she drinks about 14.0 standard drinks of alcohol per week. She reports that she does not use drugs.  Current Outpatient Medications on File Prior to Visit  Medication Sig Dispense Refill  . aspirin 81 MG tablet Take 81 mg by mouth daily.    . Multiple Vitamin (MULTIVITAMIN) capsule Take 1 capsule by mouth daily.     Current Facility-Administered Medications on File Prior to Visit  Medication Dose Route Frequency Provider Last Rate Last Dose  . 0.9 %  sodium chloride infusion  500 mL Intravenous Continuous Danis, Kirke Corin, MD         Objective:  Objective  Physical Exam  Constitutional: She is oriented to person, place, and time. She appears well-developed and well-nourished.  HENT:  Head: Normocephalic and atraumatic.  Eyes: Conjunctivae and EOM are normal.  Neck: Normal range of motion. Neck supple. No JVD present. Carotid bruit is not present. No thyromegaly present.  Cardiovascular: Normal rate, regular rhythm and normal heart sounds.  No murmur heard. Pulmonary/Chest: Effort normal and breath sounds normal. No respiratory distress. She has no wheezes. She has no rales. She exhibits no tenderness.  Musculoskeletal: She exhibits no edema.  Neurological: She is alert and oriented to person, place, and time.  Psychiatric: She has a normal mood and affect.  Nursing note and vitals reviewed.  BP (!) 137/52 (BP Location: Right Arm, Cuff Size: Normal)   Pulse 64   Temp 98.4 F (36.9 C) (Oral)   Resp 16   Ht 5\' 5"  (1.651 m)   Wt 157 lb 12.8 oz (  71.6 kg)   SpO2 99%   BMI 26.26 kg/m  Wt Readings from Last 3 Encounters:  11/17/17 157 lb 12.8 oz (71.6 kg)  01/07/17 159 lb (72.1 kg)  08/20/16 158 lb 3.2 oz (71.8 kg)     Lab Results  Component Value Date   WBC 6.9 01/07/2017   HGB 14.6 01/07/2017   HCT 43.6 01/07/2017     PLT 294.0 01/07/2017   GLUCOSE 86 05/16/2017   CHOL 214 (H) 05/16/2017   TRIG 73.0 05/16/2017   HDL 79.50 05/16/2017   LDLCALC 120 (H) 05/16/2017   ALT 14 05/16/2017   AST 22 05/16/2017   NA 137 05/16/2017   K 4.9 05/16/2017   CL 100 05/16/2017   CREATININE 0.70 05/16/2017   BUN 12 05/16/2017   CO2 31 05/16/2017   TSH 1.56 01/07/2017    No results found.   Assessment & Plan:  Plan  I have discontinued Connye Burkitt. Douthat's Loratadine and (Bearberry, Uva-Ursi, (UVA URSI PO)). I have also changed her losartan-hydrochlorothiazide. Additionally, I am having her maintain her multivitamin, aspirin, and citalopram. We will continue to administer sodium chloride.  Meds ordered this encounter  Medications  . losartan-hydrochlorothiazide (HYZAAR) 50-12.5 MG tablet    Sig: Take 1 tablet by mouth daily.    Dispense:  90 tablet    Refill:  1  . citalopram (CELEXA) 10 MG tablet    Sig: TAKE 1 TABLET BY MOUTH EVERY DAY    Dispense:  90 tablet    Refill:  1    Problem List Items Addressed This Visit      Unprioritized   Essential hypertension - Primary    Well controlled, no changes to meds. Encouraged heart healthy diet such as the DASH diet and exercise as tolerated.       Relevant Medications   losartan-hydrochlorothiazide (HYZAAR) 50-12.5 MG tablet   Other Relevant Orders   Comprehensive metabolic panel   Lipid panel   Generalized anxiety disorder    Stable con't meds      Relevant Medications   citalopram (CELEXA) 10 MG tablet    Other Visit Diagnoses    Adjustment disorder with mixed anxiety and depressed mood       Relevant Medications   losartan-hydrochlorothiazide (HYZAAR) 50-12.5 MG tablet   citalopram (CELEXA) 10 MG tablet      Follow-up: Return in about 6 months (around 05/20/2018) for annual exam, fasting.  [name], DO

## 2017-11-17 NOTE — Assessment & Plan Note (Signed)
Well controlled, no changes to meds. Encouraged heart healthy diet such as the DASH diet and exercise as tolerated.  °

## 2017-11-17 NOTE — Assessment & Plan Note (Signed)
Stable con't meds 

## 2017-11-18 LAB — LIPID PANEL
CHOLESTEROL: 215 mg/dL — AB (ref 0–200)
HDL: 75.1 mg/dL (ref >39.00)
LDL CALC: 116 mg/dL — AB (ref 0–99)
NONHDL: 139.77
Total CHOL/HDL Ratio: 3
Triglycerides: 118 mg/dL (ref 0.0–149.0)
VLDL: 23.6 mg/dL (ref 0.0–40.0)

## 2017-11-18 LAB — COMPREHENSIVE METABOLIC PANEL
ALK PHOS: 53 U/L (ref 39–117)
ALT: 12 U/L (ref 0–35)
AST: 20 U/L (ref 0–37)
Albumin: 4.1 g/dL (ref 3.5–5.2)
BUN: 17 mg/dL (ref 6–23)
CHLORIDE: 99 meq/L (ref 96–112)
CO2: 31 mEq/L (ref 19–32)
Calcium: 9.6 mg/dL (ref 8.4–10.5)
Creatinine, Ser: 0.89 mg/dL (ref 0.40–1.20)
GFR: 66.32 mL/min (ref >60.00)
GLUCOSE: 92 mg/dL (ref 70–99)
POTASSIUM: 4.8 meq/L (ref 3.5–5.1)
SODIUM: 137 meq/L (ref 135–145)
Total Bilirubin: 0.6 mg/dL (ref 0.2–1.2)
Total Protein: 6.7 g/dL (ref 6.0–8.3)

## 2017-11-22 ENCOUNTER — Encounter: Payer: Self-pay | Admitting: *Deleted

## 2017-11-28 NOTE — Progress Notes (Signed)
Subjective:   Kristen Schultz is a 72 y.o. female who presents for Medicare Annual (Subsequent) preventive examination.  Review of Systems: No ROS.  Medicare Wellness Visit. Additional risk factors are reflected in the social history. Cardiac Risk Factors include: advanced age (>57men, >60 women);dyslipidemia  Sleep patterns: No issues Home Safety/Smoke Alarms: Feels safe in home. Smoke alarms in place. 1 story home. Lives with husband and dog. Walk in shower.   Female:       Mammo- scheduled for oct. 21st Dexa scan- scheduled for oct. 21st CCS- next due 01/2021     Objective:     Vitals: BP 140/84 (BP Location: Left Arm, Patient Position: Sitting, Cuff Size: Normal)   Pulse 65   Ht 5\' 5"  (1.651 m)   Wt 158 lb 6.4 oz (71.8 kg)   SpO2 98%   BMI 26.36 kg/m   Body mass index is 26.36 kg/m.  Advanced Directives 11/29/2017 11/18/2015 05/19/2015  Does Patient Have a Medical Advance Directive? Yes Yes Yes  Type of Paramedic of [place];Living will [place];Living will [place];Living will  Does patient want to make changes to medical advance directive? - No - Patient declined No - Patient declined  Copy of [place] in Chart? Yes No - copy requested No - copy requested    Tobacco Social History   Tobacco Use  Smoking Status Former Smoker  . Packs/day: 0.30  . Years: 15.00  . Pack years: 4.50  . Last attempt to quit: 11/17/1985  . Years since quitting: 32.0  Smokeless Tobacco Never Used     Counseling given: Not Answered   Clinical Intake: Pain : No/denies pain     Past Medical History:  Diagnosis Date  . Anxiety   . Colon polyps   . Gastric ulcer    per patient; in the 1970s  . Hyperlipidemia   . Hypertension   . Liver cyst   . Ovarian cyst 1984  . Pneumonia    Past Surgical History:  Procedure Laterality Date  . DENTAL SURGERY  2000   dental implant  . HEMORRHOID SURGERY     . LAPAROSCOPIC LIVER CYST REMOVAL    . OVARIAN CYST SURGERY     Family History  Problem Relation Age of Onset  . Heart disease Father   . Heart disease Brother 83  . Heart disease Brother   . Glaucoma Neg Hx   . Colon cancer Neg Hx   . Esophageal cancer Neg Hx   . Pancreatic cancer Neg Hx   . Stomach cancer Neg Hx    Social History   Socioeconomic History  . Marital status: Married    Spouse name: Not on file  . Number of children: Not on file  . Years of education: Not on file  . Highest education level: Not on file  Occupational History    Comment: retired  Scientific laboratory technician  . Financial resource strain: Not on file  . Food insecurity:    Worry: Not on file    Inability: Not on file  . Transportation needs:    Medical: Not on file    Non-medical: Not on file  Tobacco Use  . Smoking status: Former Smoker    Packs/day: 0.30    Years: 15.00    Pack years: 4.50    Last attempt to quit: 11/17/1985    Years since quitting: 32.0  . Smokeless tobacco: Never Used  Substance and  Sexual Activity  . Alcohol use: Yes    Alcohol/week: 14.0 standard drinks    Types: 14 Glasses of wine per week    Comment: 2 glasses per night  . Drug use: No  . Sexual activity: Not Currently    Partners: Male  Lifestyle  . Physical activity:    Days per week: Not on file    Minutes per session: Not on file  . Stress: Not on file  Relationships  . Social connections:    Talks on phone: Not on file    Gets together: Not on file    Attends religious service: Not on file    Active member of club or organization: Not on file    Attends meetings of clubs or organizations: Not on file    Relationship status: Not on file  Other Topics Concern  . Not on file  Social History Narrative   Exercise- Walks about 15-22 miles per week with womens group.    Outpatient Encounter Medications as of 11/29/2017  Medication Sig  . aspirin 81 MG tablet Take 81 mg by mouth daily.  . citalopram (CELEXA) 10  MG tablet TAKE 1 TABLET BY MOUTH EVERY DAY  . losartan-hydrochlorothiazide (HYZAAR) 50-12.5 MG tablet Take 1 tablet by mouth daily.  . Multiple Vitamin (MULTIVITAMIN) capsule Take 1 capsule by mouth daily.   Facility-Administered Encounter Medications as of 11/29/2017  Medication  . 0.9 %  sodium chloride infusion    Activities of Daily Living In your present state of health, do you have any difficulty performing the following activities: 11/29/2017 01/07/2017  Hearing? N N  Vision? N N  Difficulty concentrating or making decisions? N N  Walking or climbing stairs? N N  Dressing or bathing? N N  Doing errands, shopping? N N  Preparing Food and eating ? N -  Using the Toilet? N -  In the past six months, have you accidently leaked urine? N -  Do you have problems with loss of bowel control? N -  Managing your Medications? N -  Managing your Finances? N -  Housekeeping or managing your Housekeeping? N -  Some recent data might be hidden    Patient Care Team: Carollee Herter, Alferd Apa, DO as PCP - General (Family Medicine) Otelia Sergeant, OD as Referring Physician (Optometry) Barbaraann Barthel, Sharyn Lull, MD as Consulting Physician ([place]) Roseanne Kaufman, MD as Consulting Physician (Orthopedic Surgery) Jacolyn Reedy, MD as Consulting Physician (Cardiology)    Assessment:   This is a routine wellness examination for [place]. Physical assessment deferred to PCP.  Exercise Activities and Dietary recommendations Current Exercise Habits: Home exercise routine, Type of exercise: walking, Time (Minutes): 20, Frequency (Times/Week): 7, Weekly Exercise (Minutes/Week): 140, Intensity: Mild, Exercise limited by: None identified Diet (meal preparation, eat out, water intake, caffeinated beverages, dairy products, fruits and vegetables): in general, a "healthy" diet  , well balanced   Goals    . DIET - INCREASE WATER INTAKE    . Increase physical activity     Start back to [facility]        Fall Risk Fall Risk  11/29/2017 01/07/2017 11/18/2015 11/11/2014  Falls in the past year? No Yes No No  Number falls in past yr: - 1 - -  Injury with Fall? - Yes - -    Depression Screen PHQ 2/9 Scores 11/29/2017 01/07/2017 11/18/2015 11/11/2014  PHQ - 2 Score 0 0 0 0     Cognitive Function Ad8 score reviewed  for issues:  Issues making decisions:no  Less interest in hobbies / activities:no  Repeats questions, stories (family complaining):no  Trouble using ordinary gadgets (microwave, computer, phone):no  Forgets the month or year: no  Mismanaging finances: no  Remembering appts:no  Daily problems with thinking and/or memory:no Ad8 score is=0  MMSE - Mini Mental State Exam 11/18/2015  Orientation to time 5  Orientation to Place 5  Registration 3  Attention/ Calculation 5  Recall 3  Language- name 2 objects 2  Language- repeat 1  Language- follow 3 step command 3  Language- read & follow direction 1  Write a sentence 1  Copy design 1  Total score 30        Immunization History  Administered Date(s) Administered  . Influenza, High Dose Seasonal PF 02/20/2016, 01/07/2017  . Influenza,inj,Quad PF,6+ Mos 01/17/2015  . Pneumococcal Conjugate-13 01/26/2013, 01/17/2015  . Pneumococcal Polysaccharide-23 06/23/2011, 04/27/2012  . Pneumococcal-Unspecified 01/26/2013  . Td 03/22/2009  . Zoster 03/22/2012  . Zoster Recombinat (Shingrix) 11/18/2017   Screening Tests Health Maintenance  Topic Date Due  . MAMMOGRAM  01/09/2016  . DEXA SCAN  02/10/2017  . INFLUENZA VACCINE  10/20/2017  . TETANUS/TDAP  03/23/2019  . COLONOSCOPY  02/09/2021  . Hepatitis C Screening  Completed  . PNA vac Low Risk Adult  Completed      Plan:    Please schedule your next medicare wellness visit with me in 1 yr.  Continue to eat heart healthy diet (full of fruits, vegetables, whole grains, lean protein, water--limit salt, fat, and sugar intake) and increase physical activity as  tolerated.   I have personally reviewed and noted the following in the patient's chart:   . Medical and social history . Use of alcohol, tobacco or illicit drugs  . Current medications and supplements . Functional ability and status . Nutritional status . Physical activity . Advanced directives . List of other physicians . Hospitalizations, surgeries, and ER visits in previous 12 months . Vitals . Screenings to include cognitive, depression, and falls . Referrals and appointments  In addition, I have reviewed and discussed with patient certain preventive protocols, quality metrics, and best practice recommendations. A written personalized care plan for preventive services as well as general preventive health recommendations were provided to patient.     Shela Nevin, [place]  11/29/2017

## 2017-11-29 ENCOUNTER — Ambulatory Visit (INDEPENDENT_AMBULATORY_CARE_PROVIDER_SITE_OTHER): Payer: PPO | Admitting: *Deleted

## 2017-11-29 ENCOUNTER — Encounter: Payer: Self-pay | Admitting: *Deleted

## 2017-11-29 VITALS — BP 140/84 | HR 65 | Ht 65.0 in | Wt 158.4 lb

## 2017-11-29 DIAGNOSIS — Z0000 Encounter for general adult medical examination without abnormal findings: Secondary | ICD-10-CM

## 2017-11-29 NOTE — Progress Notes (Signed)
Reviewed  Yvonne R Lowne Chase, DO  

## 2017-11-29 NOTE — Patient Instructions (Signed)
It was so nice to meet you today. Keep up the great work!   Please schedule your next medicare wellness visit with me in 1 yr.  Continue to eat heart healthy diet (full of fruits, vegetables, whole grains, lean protein, water--limit salt, fat, and sugar intake) and increase physical activity as tolerated.   Kristen Schultz , Thank you for taking time to come for your Medicare Wellness Visit. I appreciate your ongoing commitment to your health goals. Please review the following plan we discussed and let me know if I can assist you in the future.   These are the goals we discussed: Goals    . DIET - INCREASE WATER INTAKE    . Increase physical activity     Start back to [facility]       This is a list of the screening recommended for you and due dates:  Health Maintenance  Topic Date Due  . Mammogram  01/09/2016  . DEXA scan (bone density measurement)  02/10/2017  . Flu Shot  10/20/2017  . Tetanus Vaccine  03/23/2019  . Colon Cancer Screening  02/09/2021  .  Hepatitis C: One time screening is recommended by Center for Disease Control  (CDC) for  adults born from [birth year] through 1965.   Completed  . Pneumonia vaccines  Completed    Health Maintenance for Postmenopausal Women Menopause is a normal process in which your reproductive ability comes to an end. This process happens gradually over a span of months to years, usually between the ages of 63 and 12. Menopause is complete when you have missed 12 consecutive menstrual periods. It is important to talk with your health care provider about some of the most common conditions that affect postmenopausal women, such as heart disease, cancer, and bone loss (osteoporosis). Adopting a healthy lifestyle and getting preventive care can help to promote your health and wellness. Those actions can also lower your chances of developing some of these common conditions. What should I know about menopause? During menopause, you may experience a number of symptoms,  such as:  Moderate-to-severe hot flashes.  Night sweats.  Decrease in sex drive.  Mood swings.  Headaches.  Tiredness.  Irritability.  Memory problems.  Insomnia.  Choosing to treat or not to treat menopausal changes is an individual decision that you make with your health care provider. What should I know about hormone replacement therapy and supplements? Hormone therapy products are effective for treating symptoms that are associated with menopause, such as hot flashes and night sweats. Hormone replacement carries certain risks, especially as you become older. If you are thinking about using estrogen or estrogen with progestin treatments, discuss the benefits and risks with your health care provider. What should I know about heart disease and stroke? Heart disease, heart attack, and stroke become more likely as you age. This may be due, in part, to the hormonal changes that your body experiences during menopause. These can affect how your body processes dietary fats, triglycerides, and cholesterol. Heart attack and stroke are both medical emergencies. There are many things that you can do to help prevent heart disease and stroke:  Have your blood pressure checked at least every 1-2 years. High blood pressure causes heart disease and increases the risk of stroke.  If you are 36-30 years old, ask your health care provider if you should take aspirin to prevent a heart attack or a stroke.  Do not use any tobacco products, including cigarettes, chewing tobacco, or electronic cigarettes. If  you need help quitting, ask your health care provider.  It is important to eat a healthy diet and maintain a healthy weight. ? Be sure to include plenty of vegetables, fruits, low-fat dairy products, and lean protein. ? Avoid eating foods that are high in solid fats, added sugars, or salt (sodium).  Get regular exercise. This is one of the most important things that you can do for your  health. ? Try to exercise for at least 150 minutes each week. The type of exercise that you do should increase your heart rate and make you sweat. This is known as moderate-intensity exercise. ? Try to do strengthening exercises at least twice each week. Do these in addition to the moderate-intensity exercise.  Know your numbers.Ask your health care provider to check your cholesterol and your blood glucose. Continue to have your blood tested as directed by your health care provider.  What should I know about cancer screening? There are several types of cancer. Take the following steps to reduce your risk and to catch any cancer development as early as possible. Breast Cancer  Practice breast self-awareness. ? This means understanding how your breasts normally appear and feel. ? It also means doing regular breast self-exams. Let your health care provider know about any changes, no matter how small.  If you are 69 or older, have a clinician do a breast exam (clinical breast exam or CBE) every year. Depending on your age, family history, and medical history, it may be recommended that you also have a yearly breast X-ray (mammogram).  If you have a family history of breast cancer, talk with your health care provider about genetic screening.  If you are at high risk for breast cancer, talk with your health care provider about having an MRI and a mammogram every year.  Breast cancer (BRCA) gene test is recommended for women who have family members with BRCA-related cancers. Results of the assessment will determine the need for genetic counseling and BRCA1 and for BRCA2 testing. BRCA-related cancers include these types: ? Breast. This occurs in males or females. ? Ovarian. ? Tubal. This may also be called fallopian tube cancer. ? Cancer of the abdominal or pelvic lining (peritoneal cancer). ? Prostate. ? Pancreatic.  Cervical, Uterine, and Ovarian Cancer Your health care provider may recommend  that you be screened regularly for cancer of the pelvic organs. These include your ovaries, uterus, and vagina. This screening involves a pelvic exam, which includes checking for microscopic changes to the surface of your cervix (Pap test).  For women ages 21-65, health care providers may recommend a pelvic exam and a Pap test every three years. For women ages 74-65, they may recommend the Pap test and pelvic exam, combined with testing for human papilloma virus (HPV), every five years. Some types of HPV increase your risk of cervical cancer. Testing for HPV may also be done on women of any age who have unclear Pap test results.  Other health care providers may not recommend any screening for nonpregnant women who are considered low risk for pelvic cancer and have no symptoms. Ask your health care provider if a screening pelvic exam is right for you.  If you have had past treatment for cervical cancer or a condition that could lead to cancer, you need Pap tests and screening for cancer for at least 20 years after your treatment. If Pap tests have been discontinued for you, your risk factors (such as having a new sexual partner) need to  be reassessed to determine if you should start having screenings again. Some women have medical problems that increase the chance of getting cervical cancer. In these cases, your health care provider may recommend that you have screening and Pap tests more often.  If you have a family history of uterine cancer or ovarian cancer, talk with your health care provider about genetic screening.  If you have vaginal bleeding after reaching menopause, tell your health care provider.  There are currently no reliable tests available to screen for ovarian cancer.  Lung Cancer Lung cancer screening is recommended for adults 60-21 years old who are at high risk for lung cancer because of a history of smoking. A yearly low-dose CT scan of the lungs is recommended if you:  Currently  smoke.  Have a history of at least 30 pack-years of smoking and you currently smoke or have quit within the past 15 years. A pack-year is smoking an average of one pack of cigarettes per day for one year.  Yearly screening should:  Continue until it has been 15 years since you quit.  Stop if you develop a health problem that would prevent you from having lung cancer treatment.  Colorectal Cancer  This type of cancer can be detected and can often be prevented.  Routine colorectal cancer screening usually begins at age 57 and continues through age 63.  If you have risk factors for colon cancer, your health care provider may recommend that you be screened at an earlier age.  If you have a family history of colorectal cancer, talk with your health care provider about genetic screening.  Your health care provider may also recommend using home test kits to check for hidden blood in your stool.  A small camera at the end of a tube can be used to examine your colon directly (sigmoidoscopy or colonoscopy). This is done to check for the earliest forms of colorectal cancer.  Direct examination of the colon should be repeated every 5-10 years until age 25. However, if early forms of precancerous polyps or small growths are found or if you have a family history or genetic risk for colorectal cancer, you may need to be screened more often.  Skin Cancer  Check your skin from head to toe regularly.  Monitor any moles. Be sure to tell your health care provider: ? About any new moles or changes in moles, especially if there is a change in a mole's shape or color. ? If you have a mole that is larger than the size of a pencil eraser.  If any of your family members has a history of skin cancer, especially at a young age, talk with your health care provider about genetic screening.  Always use sunscreen. Apply sunscreen liberally and repeatedly throughout the day.  Whenever you are outside, protect  yourself by wearing long sleeves, pants, a wide-brimmed hat, and sunglasses.  What should I know about osteoporosis? Osteoporosis is a condition in which bone destruction happens more quickly than new bone creation. After menopause, you may be at an increased risk for osteoporosis. To help prevent osteoporosis or the bone fractures that can happen because of osteoporosis, the following is recommended:  If you are 41-64 years old, get at least 1,000 mg of calcium and at least 600 mg of vitamin D per day.  If you are older than age 81 but younger than age 98, get at least 1,200 mg of calcium and at least 600 mg of vitamin D  per day.  If you are older than age 77, get at least 1,200 mg of calcium and at least 800 mg of vitamin D per day.  Smoking and excessive alcohol intake increase the risk of osteoporosis. Eat foods that are rich in calcium and vitamin D, and do weight-bearing exercises several times each week as directed by your health care provider. What should I know about how menopause affects my mental health? Depression may occur at any age, but it is more common as you become older. Common symptoms of depression include:  Low or sad mood.  Changes in sleep patterns.  Changes in appetite or eating patterns.  Feeling an overall lack of motivation or enjoyment of activities that you previously enjoyed.  Frequent crying spells.  Talk with your health care provider if you think that you are experiencing depression. What should I know about immunizations? It is important that you get and maintain your immunizations. These include:  Tetanus, diphtheria, and pertussis (Tdap) booster vaccine.  Influenza every year before the flu season begins.  Pneumonia vaccine.  Shingles vaccine.  Your health care provider may also recommend other immunizations. This information is not intended to replace advice given to you by your health care provider. Make sure you discuss any questions you  have with your health care provider. Document Released: 04/30/2005 Document Revised: 09/26/2015 Document Reviewed: 12/10/2014 Elsevier Interactive Patient Education  2018 [employer].

## 2018-01-09 ENCOUNTER — Ambulatory Visit
Admission: RE | Admit: 2018-01-09 | Discharge: 2018-01-09 | Disposition: A | Discharge status: Home / Self Care | Payer: PPO | Source: Ambulatory Visit | Attending: Family Medicine | Admitting: Family Medicine

## 2018-01-09 DIAGNOSIS — Z780 Asymptomatic menopausal state: Secondary | ICD-10-CM | POA: Diagnosis not present

## 2018-01-09 DIAGNOSIS — M8580 Other specified disorders of bone density and structure, unspecified site: Secondary | ICD-10-CM

## 2018-01-09 DIAGNOSIS — Z1231 Encounter for screening mammogram for malignant neoplasm of breast: Secondary | ICD-10-CM

## 2018-01-09 DIAGNOSIS — M8589 Other specified disorders of bone density and structure, multiple sites: Secondary | ICD-10-CM | POA: Diagnosis not present

## 2018-03-31 ENCOUNTER — Ambulatory Visit: Payer: Self-pay

## 2018-03-31 NOTE — Telephone Encounter (Signed)
Reported ongoing nausea for past several months.  Reported intermittent abdominal bloating, frequent belching, and non-radiating pressure mid-diaphragm.  Stated bowels are regular.  Reported diarrhea stools on 1/6 and 1/7.  Denied vomiting.  Reported has had increased care giving responsibility with sibling.  Stated that she felt the nausea would get better, but "it is distracting."  Appt. Sched. On 04/03/18 with PCP.  Care advice given per protocol.  Verb. Understanding; agrees with plan.     Reason for Disposition . Nausea is a chronic symptom (recurrent or ongoing AND present > 4 weeks)  Answer Assessment - Initial Assessment Questions 1. NAUSEA SEVERITY: "How bad is the nausea?" (e.g., mild, moderate, severe; dehydration, weight loss)   - MILD: loss of appetite without change in eating habits   - MODERATE: decreased oral intake without significant weight loss, dehydration, or malnutrition   - SEVERE: inadequate caloric or fluid intake, significant weight loss, symptoms of dehydration     Mild-moderate 2. ONSET: "When did the nausea begin?"     Several months ago  3. VOMITING: "Any vomiting?" If so, ask: "How many times today?"     denied 4. RECURRENT SYMPTOM: "Have you had nausea before?" If so, ask: "When was the last time?" "What happened that time?"     Hx of duodenal ulcer in 1979  5. CAUSE: "What do you think is causing the nausea?"     Unsure; thinks the nausea began after shingle vaccine or flu vaccine   6. PREGNANCY: "Is there any chance you are pregnant?" (e.g., unprotected intercourse, missed birth control pill, broken condom)    N/a  7.  Other symptoms:  persistent nausea; eating less but has not lost weight, Pressure in mid-diaphragm (non-radiating), abdominal bloating intermittently, has daily BM's; intermittent diarrhea-last loose stool on 03/28/18.  Having freq. burping.  Protocols used: NAUSEA-A-AH

## 2018-04-03 ENCOUNTER — Ambulatory Visit (HOSPITAL_BASED_OUTPATIENT_CLINIC_OR_DEPARTMENT_OTHER): Payer: Medicare HMO

## 2018-04-03 ENCOUNTER — Encounter: Payer: Self-pay | Admitting: Family Medicine

## 2018-04-03 ENCOUNTER — Other Ambulatory Visit: Payer: Self-pay | Admitting: Family Medicine

## 2018-04-03 ENCOUNTER — Ambulatory Visit (HOSPITAL_BASED_OUTPATIENT_CLINIC_OR_DEPARTMENT_OTHER)
Admission: RE | Admit: 2018-04-03 | Discharge: 2018-04-03 | Disposition: A | Discharge status: Home / Self Care | Payer: Medicare HMO | Source: Ambulatory Visit | Attending: Family Medicine | Admitting: Family Medicine

## 2018-04-03 ENCOUNTER — Ambulatory Visit (INDEPENDENT_AMBULATORY_CARE_PROVIDER_SITE_OTHER): Payer: Medicare HMO | Admitting: Family Medicine

## 2018-04-03 VITALS — BP 136/76 | HR 60 | Temp 98.2°F | Resp 16 | Ht 65.0 in | Wt 158.6 lb

## 2018-04-03 DIAGNOSIS — R1013 Epigastric pain: Secondary | ICD-10-CM

## 2018-04-03 DIAGNOSIS — N854 Malposition of uterus: Secondary | ICD-10-CM | POA: Diagnosis not present

## 2018-04-03 DIAGNOSIS — R140 Abdominal distension (gaseous): Secondary | ICD-10-CM

## 2018-04-03 DIAGNOSIS — K8500 Idiopathic acute pancreatitis without necrosis or infection: Secondary | ICD-10-CM

## 2018-04-03 DIAGNOSIS — H9319 Tinnitus, unspecified ear: Secondary | ICD-10-CM | POA: Diagnosis not present

## 2018-04-03 DIAGNOSIS — R110 Nausea: Secondary | ICD-10-CM

## 2018-04-03 LAB — CBC WITH DIFFERENTIAL/PLATELET
BASOS PCT: 0.9 % (ref 0.0–3.0)
Basophils Absolute: 0.1 10*3/uL (ref 0.0–0.1)
EOS ABS: 0.5 10*3/uL (ref 0.0–0.7)
Eosinophils Relative: 7.2 % — ABNORMAL HIGH (ref 0.0–5.0)
HCT: 43.6 % (ref 36.0–46.0)
Hemoglobin: 14.7 g/dL (ref 12.0–15.0)
LYMPHS ABS: 1.4 10*3/uL (ref 0.7–4.0)
Lymphocytes Relative: 21 % (ref 12.0–46.0)
MCHC: 33.8 g/dL (ref 30.0–36.0)
MCV: 96.2 fl (ref 78.0–100.0)
Monocytes Absolute: 0.8 10*3/uL (ref 0.1–1.0)
Monocytes Relative: 12.3 % — ABNORMAL HIGH (ref 3.0–12.0)
NEUTROS ABS: 3.9 10*3/uL (ref 1.4–7.7)
NEUTROS PCT: 58.6 % (ref 43.0–77.0)
PLATELETS: 282 10*3/uL (ref 150.0–400.0)
RBC: 4.54 Mil/uL (ref 3.87–5.11)
RDW: 13 % (ref 11.5–15.5)
WBC: 6.6 10*3/uL (ref 4.0–10.5)

## 2018-04-03 LAB — COMPREHENSIVE METABOLIC PANEL
ALT: 17 U/L (ref 0–35)
AST: 22 U/L (ref 0–37)
Albumin: 3.9 g/dL (ref 3.5–5.2)
Alkaline Phosphatase: 58 U/L (ref 39–117)
BUN: 11 mg/dL (ref 6–23)
CHLORIDE: 99 meq/L (ref 96–112)
CO2: 29 meq/L (ref 19–32)
CREATININE: 0.69 mg/dL (ref 0.40–1.20)
Calcium: 9.4 mg/dL (ref 8.4–10.5)
GFR: 88.87 mL/min (ref >60.00)
GLUCOSE: 84 mg/dL (ref 70–99)
Potassium: 4.9 mEq/L (ref 3.5–5.1)
SODIUM: 136 meq/L (ref 135–145)
Total Bilirubin: 0.5 mg/dL (ref 0.2–1.2)
Total Protein: 6.6 g/dL (ref 6.0–8.3)

## 2018-04-03 LAB — AMYLASE: Amylase: 395 U/L — ABNORMAL HIGH (ref 27–131)

## 2018-04-03 LAB — LIPASE: LIPASE: 15 U/L (ref 11.0–59.0)

## 2018-04-03 LAB — H. PYLORI ANTIBODY, IGG: H PYLORI IGG: NEGATIVE

## 2018-04-03 MED ORDER — FAMOTIDINE 40 MG PO TABS: 40.0000 mg | ORAL_TABLET | Freq: Every day | ORAL | 5 refills | Status: DC

## 2018-04-03 NOTE — Patient Instructions (Signed)

## 2018-04-03 NOTE — Progress Notes (Signed)
Patient ID: Kristen Schultz, female    DOB: [birth year]  Age: 73 y.o. MRN: [id number]    Subjective:  Subjective  HPI Kristen Schultz presents for belching and nausea since late fall.  She has had some tinnitus --- cont- for years  No vomiting, no abd pain.     Review of Systems  Constitutional: Negative for appetite change, diaphoresis, fatigue and unexpected weight change.  HENT: Positive for tinnitus. Negative for ear discharge, ear pain and hearing loss.   Eyes: Negative for pain, redness and visual disturbance.  Respiratory: Negative for cough, chest tightness, shortness of breath and wheezing.   Cardiovascular: Negative for chest pain, palpitations and leg swelling.  Gastrointestinal: Positive for nausea.  Endocrine: Negative for cold intolerance, heat intolerance, polydipsia, polyphagia and polyuria.  Genitourinary: Negative for difficulty urinating, dysuria and frequency.  Neurological: Negative for dizziness, light-headedness, numbness and headaches.    History Past Medical History:  Diagnosis Date  . Anxiety   . Colon polyps   . Gastric ulcer    per patient; in the 1970s  . Hyperlipidemia   . Hypertension   . Liver cyst   . Ovarian cyst 1984  . Pneumonia     She has a past surgical history that includes Hemorrhoid surgery; Laparoscopic liver cyst removal; Ovarian cyst surgery; and Dental surgery (2000).   Her family history includes Heart disease in her brother and father; Heart disease (age of onset: 68) in her brother.She reports that she quit smoking about 32 years ago. She has a 4.50 pack-year smoking history. She has never used smokeless tobacco. She reports current alcohol use of about 14.0 standard drinks of alcohol per week. She reports that she does not use drugs.  Current Outpatient Medications on File Prior to Visit  Medication Sig Dispense Refill  . aspirin 81 MG tablet Take 81 mg by mouth daily.    . citalopram (CELEXA) 10 MG tablet TAKE 1 TABLET BY MOUTH  EVERY DAY 90 tablet 1  . losartan-hydrochlorothiazide (HYZAAR) 50-12.5 MG tablet Take 1 tablet by mouth daily. 90 tablet 1  . Multiple Vitamin (MULTIVITAMIN) capsule Take 1 capsule by mouth daily.     Current Facility-Administered Medications on File Prior to Visit  Medication Dose Route Frequency Provider Last Rate Last Dose  . 0.9 %  sodium chloride infusion  500 mL Intravenous Continuous Danis, Kirke Corin, MD         Objective:  Objective  Physical Exam Vitals signs and nursing note reviewed.  Constitutional:      Appearance: She is well-developed.  HENT:     Head: Normocephalic and atraumatic.     Right Ear: Tympanic membrane, ear canal and external ear normal.     Left Ear: Tympanic membrane, ear canal and external ear normal.  Eyes:     Conjunctiva/sclera: Conjunctivae normal.  Neck:     Musculoskeletal: Normal range of motion and neck supple.     Thyroid: No thyromegaly.     Vascular: No carotid bruit or JVD.  Cardiovascular:     Rate and Rhythm: Normal rate and regular rhythm.     Heart sounds: Normal heart sounds. No murmur.  Pulmonary:     Effort: Pulmonary effort is normal. No respiratory distress.     Breath sounds: Normal breath sounds. No wheezing or rales.  Chest:     Chest wall: No tenderness.  Abdominal:     General: There is distension.     Tenderness: There is abdominal tenderness  in the epigastric area. There is no guarding or rebound.  Neurological:     Mental Status: She is alert and oriented to person, place, and time.    BP 136/76 (BP Location: Right Arm, Cuff Size: Normal)   Pulse 60   Temp 98.2 F (36.8 C) (Oral)   Resp 16   Ht 5\' 5"  (1.651 m)   Wt 158 lb 9.6 oz (71.9 kg)   SpO2 97%   BMI 26.39 kg/m  Wt Readings from Last 3 Encounters:  04/03/18 158 lb 9.6 oz (71.9 kg)  11/29/17 158 lb 6.4 oz (71.8 kg)  11/17/17 157 lb 12.8 oz (71.6 kg)     Lab Results  Component Value Date   WBC 6.9 01/07/2017   HGB 14.6 01/07/2017   HCT 43.6  01/07/2017   PLT 294.0 01/07/2017   GLUCOSE 92 11/17/2017   CHOL 215 (H) 11/17/2017   TRIG 118.0 11/17/2017   HDL 75.10 11/17/2017   LDLCALC 116 (H) 11/17/2017   ALT 12 11/17/2017   AST 20 11/17/2017   NA 137 11/17/2017   K 4.8 11/17/2017   CL 99 11/17/2017   CREATININE 0.89 11/17/2017   BUN 17 11/17/2017   CO2 31 11/17/2017   TSH 1.56 01/07/2017    Dg Bone Density  Result Date: 01/09/2018 EXAM: DUAL X-RAY ABSORPTIOMETRY (DXA) FOR BONE MINERAL DENSITY IMPRESSION: Referring Physician:  Garnet Koyanagi MD Your patient completed a BMD test using Lunar IDXA DXA system ( analysis version: 16 ) manufactured by [employer]. Technologist: AW PATIENT: Name: Kristen Schultz, Kristen Schultz Patient ID: [id number] Birth Date: 11/08/1945 Height: 64.0 in. Sex: Female Measured: 01/09/2018 Weight: 165.0 lbs. Indications: Advanced Age, Caucasian, Estrogen Deficient, History of Osteopenia, Low Calcium Intake (269.3), Postmenopausal, Secondary Osteoporosis Fractures: None Treatments: Multivitamin ASSESSMENT: The BMD measured at Femur Neck Right is 0.792 g/cm2 with a T-score of -1.8. This patient is considered OSTEOPENIC according to [facility]) criteria. There has been a statistically significant decrease in BMD of Total Mean since prior exam dated 02/11/2015. The scan quality is good. L1 was excluded due to being excluded in prior exam. Site Region Measured Date Measured Age YA T-score BMD Significant CHANGE DualFemur Neck Right 01/09/2018 71.8 -1.8 0.792 g/cm2 * DualFemur Neck Right 02/11/2015    68.9         -1.4    0.844 g/cm2 AP Spine L2-L4 01/09/2018 71.8 -1.1 1.078 g/cm2 * AP Spine L2-L4 02/11/2015 68.9 -0.8 1.121 g/cm2 * DualFemur Total Mean 01/09/2018 71.8 -1.3 0.846 g/cm2 * DualFemur Total Mean 02/11/2015    68.9         -1.0    0.875 g/cm2 World Health Organization [facility]) criteria for post-menopausal, Caucasian Women: Normal       T-score at or above -1 SD Osteopenia   T-score between -1 and -2.5 SD  Osteoporosis T-score at or below -2.5 SD RECOMMENDATION: 1. All patients should optimize calcium and vitamin D intake. 2. Consider FDA approved medical therapies in postmenopausal women and men aged 23 years and older, based on the following: a. A hip or vertebral (clinical or morphometric) fracture b. T- score < or = -2.5 at the femoral neck or spine after appropriate evaluation to exclude secondary causes c. Low bone mass (T-score between -1.0 and -2.5 at the femoral neck or spine) and a 10 year probability of a hip fracture > or = 3% or a 10 year probability of a major osteoporosis-related fracture > or = 20% based on the [place]-adapted [facility])  algorithm d. Clinician judgment and/or patient preferences may indicate treatment for people with 10-year fracture probabilities above or below these levels FOLLOW-UP: People with diagnosed cases of osteoporosis or at high risk for fracture should have regular bone mineral density tests. For patients eligible for Medicare, routine testing is allowed once every 2 years. The testing frequency can be increased to one year for patients who have rapidly progressing disease, those who are receiving or discontinuing medical therapy to restore bone mass, or have additional risk factors. I have reviewed this report and agree with the above findings. Mark A. Thornton Papas, M.D. [facility] FRAX* 10-year Probability of Fracture Based on femoral neck BMD: DualFemur (Right) Major Osteoporotic Fracture: 11.1% Hip Fracture:                2.1% Population:                  [place] (Caucasian) Risk Factors:                Secondary Osteoporosis *FRAX is a Materials engineer of the [employer] for Metabolic Bone Disease, a [place] (WHO) [facility]. ASSESSMENT: The probability of a major osteoporotic fracture is 11.1 % within the next ten years. The probability of hip fracture is  2.1  % within the next 10 years. I have reviewed this report and  agree with the above findings. Mark A. Thornton Papas, M.D. [facility] Electronically Signed   By: Lavonia Dana M.D.   On: 01/09/2018 08:50   Mm 3d Screen Breast Bilateral  Result Date: 01/09/2018 CLINICAL DATA:  Screening. EXAM: DIGITAL SCREENING BILATERAL MAMMOGRAM WITH TOMO AND CAD COMPARISON:  Previous exam(s). ACR Breast Density Category b: There are scattered areas of fibroglandular density. FINDINGS: There are no findings suspicious for malignancy. Images were processed with CAD. IMPRESSION: No mammographic evidence of malignancy. A result letter of this screening mammogram will be mailed directly to the patient. RECOMMENDATION: Screening mammogram in one year. (Code:SM-B-01Y) BI-RADS CATEGORY  1: Negative. Electronically Signed   By: Lajean Manes M.D.   On: 01/09/2018 13:42     Assessment & Plan:  Plan  I am having Kristen Burkitt. Schultz start on famotidine. I am also having her maintain her multivitamin, aspirin, losartan-hydrochlorothiazide, and citalopram. We will continue to administer sodium chloride.  Meds ordered this encounter  Medications  . famotidine (PEPCID) 40 MG tablet    Sig: Take 1 tablet (40 mg total) by mouth daily.    Dispense:  30 tablet    Refill:  5    Problem List Items Addressed This Visit    None    Visit Diagnoses    Nausea    -  Primary   Relevant Medications   famotidine (PEPCID) 40 MG tablet   Other Relevant Orders   Ambulatory referral to Gastroenterology   US Abdomen Complete   US PELVIS (TRANSABDOMINAL ONLY)   US PELVIS TRANSVANGINAL NON-OB (TV ONLY)   H. pylori antibody, IgG   Comprehensive metabolic panel   CBC with Differential/Platelet   Amylase   Lipase   Dyspepsia       Relevant Medications   famotidine (PEPCID) 40 MG tablet   Other Relevant Orders   Ambulatory referral to Gastroenterology   US Abdomen Complete   US PELVIS (TRANSABDOMINAL ONLY)   US PELVIS TRANSVANGINAL NON-OB (TV ONLY)   H. pylori antibody, IgG   Comprehensive  metabolic panel   CBC with Differential/Platelet   Amylase  Lipase   Abdominal bloating       Relevant Orders   US Abdomen Complete   US PELVIS (TRANSABDOMINAL ONLY)   US PELVIS TRANSVANGINAL NON-OB (TV ONLY)   H. pylori antibody, IgG   Comprehensive metabolic panel   CBC with Differential/Platelet   Amylase   Lipase   Tinnitus, unspecified laterality       Relevant Orders   Ambulatory referral to ENT      Follow-up: Return if symptoms worsen or fail to improve.  [name], DO

## 2018-04-04 ENCOUNTER — Telehealth: Payer: Self-pay | Admitting: *Deleted

## 2018-04-04 NOTE — Telephone Encounter (Signed)
-----   Message from [name], DO sent at 04/03/2018  8:45 PM EST ----- Pancreatitis -- with elevated amylase,  [place] normal  Pt did not want to wait 3 days to repeat labs--- she has had only liquids since yesterday.  She would like to come tomorrow late for labs or wed am--- please call her to schedule lab app--- order is in

## 2018-04-04 NOTE — Telephone Encounter (Signed)
Patient notified and appt made for tomorrow.

## 2018-04-05 ENCOUNTER — Other Ambulatory Visit (INDEPENDENT_AMBULATORY_CARE_PROVIDER_SITE_OTHER): Payer: Medicare HMO

## 2018-04-05 DIAGNOSIS — K8500 Idiopathic acute pancreatitis without necrosis or infection: Secondary | ICD-10-CM | POA: Diagnosis not present

## 2018-04-05 LAB — COMPREHENSIVE METABOLIC PANEL
ALT: 16 U/L (ref 0–35)
AST: 24 U/L (ref 0–37)
Albumin: 4.1 g/dL (ref 3.5–5.2)
Alkaline Phosphatase: 58 U/L (ref 39–117)
BUN: 14 mg/dL (ref 6–23)
CHLORIDE: 92 meq/L — AB (ref 96–112)
CO2: 22 meq/L (ref 19–32)
Calcium: 9.5 mg/dL (ref 8.4–10.5)
Creatinine, Ser: 0.75 mg/dL (ref 0.40–1.20)
GFR: 80.72 mL/min (ref >60.00)
GLUCOSE: 63 mg/dL — AB (ref 70–99)
POTASSIUM: 4.4 meq/L (ref 3.5–5.1)
SODIUM: 130 meq/L — AB (ref 135–145)
Total Bilirubin: 0.8 mg/dL (ref 0.2–1.2)
Total Protein: 6.9 g/dL (ref 6.0–8.3)

## 2018-04-05 LAB — AMYLASE: Amylase: 386 U/L — ABNORMAL HIGH (ref 27–131)

## 2018-04-06 ENCOUNTER — Other Ambulatory Visit: Payer: Self-pay | Admitting: Family Medicine

## 2018-04-06 ENCOUNTER — Encounter: Payer: Self-pay | Admitting: Family Medicine

## 2018-04-06 DIAGNOSIS — R109 Unspecified abdominal pain: Secondary | ICD-10-CM

## 2018-04-06 NOTE — Telephone Encounter (Signed)
Yes she should take the med we prescribed Check CT Recheck labs next week--- cmp, amylase  And yes---  Go got ent

## 2018-04-07 ENCOUNTER — Telehealth: Payer: Self-pay | Admitting: *Deleted

## 2018-04-07 NOTE — Telephone Encounter (Signed)
Yes

## 2018-04-07 NOTE — Telephone Encounter (Signed)
Would you like for her to stat on liquid diet until CT and her labs?

## 2018-04-07 NOTE — Telephone Encounter (Signed)
Patient notified.  Radiology will call to schedule ct for Monday.

## 2018-04-07 NOTE — Telephone Encounter (Signed)
-----   Message from [place] sent at 04/06/2018  4:57 PM EST ----- Called patient to relay information.  Patient asking if she needs to stay on liquid diet until her CT and until her labs?  Would like CT either here at [facility] or in [place].  Pt states she is back on the liquid as of 9am today 04/06/2018.  Pt would like to be contacted back tomorrow with instructions.  I will be glad to call the patient back. ----- Message ----- From: [name], DO Sent: 04/06/2018   8:44 AM EST To: Doylene Canning, CMA  Sodium is low--- try drinking gatorade Amylase slightly lower-- con't clear liquid diet How are you feeling?   If no change ----CT abd pelvis with contrast

## 2018-04-10 ENCOUNTER — Ambulatory Visit (HOSPITAL_BASED_OUTPATIENT_CLINIC_OR_DEPARTMENT_OTHER)
Admission: RE | Admit: 2018-04-10 | Discharge: 2018-04-10 | Disposition: A | Discharge status: Home / Self Care | Payer: Medicare HMO | Source: Ambulatory Visit | Attending: Family Medicine | Admitting: Family Medicine

## 2018-04-10 ENCOUNTER — Other Ambulatory Visit (INDEPENDENT_AMBULATORY_CARE_PROVIDER_SITE_OTHER): Payer: Medicare HMO

## 2018-04-10 DIAGNOSIS — R109 Unspecified abdominal pain: Secondary | ICD-10-CM | POA: Insufficient documentation

## 2018-04-10 DIAGNOSIS — K5730 Diverticulosis of large intestine without perforation or abscess without bleeding: Secondary | ICD-10-CM | POA: Diagnosis not present

## 2018-04-10 LAB — AMYLASE: Amylase: 365 U/L — ABNORMAL HIGH (ref 27–131)

## 2018-04-10 LAB — COMPREHENSIVE METABOLIC PANEL
ALT: 22 U/L (ref 0–35)
AST: 32 U/L (ref 0–37)
Albumin: 4.1 g/dL (ref 3.5–5.2)
Alkaline Phosphatase: 56 U/L (ref 39–117)
BUN: 5 mg/dL — ABNORMAL LOW (ref 6–23)
CHLORIDE: 90 meq/L — AB (ref 96–112)
CO2: 25 meq/L (ref 19–32)
CREATININE: 0.64 mg/dL (ref 0.40–1.20)
Calcium: 9.6 mg/dL (ref 8.4–10.5)
GFR: 91.19 mL/min (ref >60.00)
Glucose, Bld: 68 mg/dL — ABNORMAL LOW (ref 70–99)
Potassium: 4.2 mEq/L (ref 3.5–5.1)
Sodium: 129 mEq/L — ABNORMAL LOW (ref 135–145)
Total Bilirubin: 0.8 mg/dL (ref 0.2–1.2)
Total Protein: 6.8 g/dL (ref 6.0–8.3)

## 2018-04-10 MED ORDER — IOPAMIDOL (ISOVUE-300) INJECTION 61%
100.0000 mL | Freq: Once | INTRAVENOUS | Status: AC | PRN
  Administered 2018-04-10: 100 mL via INTRAVENOUS

## 2018-04-13 ENCOUNTER — Telehealth: Payer: Self-pay | Admitting: *Deleted

## 2018-04-13 MED ORDER — LOSARTAN POTASSIUM 50 MG PO TABS: 50.0000 mg | ORAL_TABLET | Freq: Every day | ORAL | 1 refills | Status: DC

## 2018-04-13 NOTE — Progress Notes (Signed)
[name] Progress Note  Chief Complaint: Nausea and belching  Subjective  History:  Kristen Schultz was referred back by primary care for some recent upper digestive symptoms.  Kristen Schultz reports a reaction to both of her shingles vaccines this fall, where Kristen Schultz had arm pain with inflammation and scabbing at the site.  Soon after that, in early December, Kristen Schultz felt like Kristen Schultz was having a pressure under the diaphragm and intermittent nausea, both of which have now subsided.  Kristen Schultz recently saw Kristen Schultz several months of belching and nausea, and labs and imaging were performed.  Elevated amylase was discovered with normal lipase.  Imaging did not show pancreatitis  Kristen Schultz saw me in November 2017 for a colonoscopy for heme positive stool.  This revealed diverticulosis, internal hemorrhoids, and a diminutive adenomatous polyp.  KristenLowne just continued her HCTZ today due to hyponatremia.  Kristen Schultz has been on a liquid diet for about the last 8 days and is feeling much better.  Kristen Schultz would like to liberalize her diet and wonders if Kristen Schultz needs any other testing.  ROS: Cardiovascular:  no chest pain Respiratory: no dyspnea No fevers or chills.  Kristen Schultz is probably lost a few pounds in the last week without solid food.  The patient's Past Medical, Family and Social History were reviewed and are on file in the EMR.  Objective:  Med list reviewed  Current Outpatient Medications:  .  aspirin 81 MG tablet, Take 81 mg by mouth daily., Disp: , Rfl:  .  citalopram (CELEXA) 10 MG tablet, TAKE 1 TABLET BY MOUTH EVERY DAY, Disp: 90 tablet, Rfl: 1 .  famotidine (PEPCID) 40 MG tablet, Take 1 tablet (40 mg total) by mouth daily., Disp: 30 tablet, Rfl: 5 .  losartan (COZAAR) 50 MG tablet, Take 50 mg by mouth daily., Disp: , Rfl:  .  Multiple Vitamin (MULTIVITAMIN) capsule, Take 1 capsule by mouth daily., Disp: , Rfl:    Vital signs in last 24 hrs: Vitals:   04/14/18 1554  BP: 124/60  Pulse: 76    Physical Exam  Kristen Schultz  is well-appearing in with her husband today.  HEENT: sclera anicteric, oral mucosa moist without lesions.  No salivary gland enlargement or tenderness  Neck: supple, no thyromegaly, JVD or lymphadenopathy  Cardiac: RRR without murmurs, S1S2 heard, no peripheral edema  Pulm: clear to auscultation bilaterally, normal RR and effort noted  Abdomen: soft, no tenderness, with active bowel sounds. No guarding or palpable hepatosplenomegaly.  Skin; warm and dry, no jaundice or rash  Recent Labs:  CBC Latest Ref Rng & Units 04/03/2018 01/07/2017 11/18/2015  WBC 4.0 - 10.5 K/uL 6.6 6.9 8.3  Hemoglobin 12.0 - 15.0 g/dL 14.7 14.6 14.3  Hematocrit 36.0 - 46.0 % 43.6 43.6 42.4  Platelets 150.0 - 400.0 K/uL 282.0 294.0 301.0   CMP Latest Ref Rng & Units 04/10/2018 04/05/2018 04/03/2018  Glucose 70 - 99 mg/dL 68(L) 63(L) 84  BUN 6 - 23 mg/dL 5(L) 14 11  Creatinine 0.40 - 1.20 mg/dL 0.64 0.75 0.69  Sodium 135 - 145 mEq/L 129(L) 130(L) 136  Potassium 3.5 - 5.1 mEq/L 4.2 4.4 4.9  Chloride 96 - 112 mEq/L 90(L) 92(L) 99  CO2 19 - 32 mEq/L 25 22 29   Calcium 8.4 - 10.5 mg/dL 9.6 9.5 9.4  Total Protein 6.0 - 8.3 g/dL 6.8 6.9 6.6  Total Bilirubin 0.2 - 1.2 mg/dL 0.8 0.8 0.5  Alkaline Phos 39 - 117 U/L 56 58 58  AST 0 -  37 U/L 32 24 22  ALT 0 - 35 U/L 22 16 17    Amylase    Component Value Date/Time   AMYLASE 365 (H) 04/10/2018 1400   Amylase was 385 a week prior  Lipase     Component Value Date/Time   LIPASE 15.0 04/03/2018 0938     Radiologic studies:  CLINICAL DATA:  73 year old female with abdominal pain for the past 2 months. Prior ovarian cyst surgery. Liver cyst post surgery. Eight ulcer tray. Gastric ulcer, hypertension hyperlipidemia. Initial encounter.   EXAM: CT ABDOMEN AND PELVIS WITH CONTRAST   TECHNIQUE: Multidetector CT imaging of the abdomen and pelvis was performed using the standard protocol following bolus administration of intravenous contrast.   CONTRAST:  169mL  ISOVUE-300 IOPAMIDOL (ISOVUE-300) INJECTION 61%   COMPARISON:  04/03/2018 ultrasound.   FINDINGS: Lower chest: Scarring/atelectasis lung bases. Base of heart unremarkable.   Hepatobiliary: Lobulated contour of the liver. It is possible this is related to prior surgery for cyst removal but cannot exclude cirrhosis. Hypodensity noted falciform ligament may reflect changes of fatty infiltration. No worrisome focal hepatic lesion. No calcified gallstone or common bile duct stone. No CT evidence of gallbladder inflammation.   Pancreas: No worrisome pancreatic mass or inflammation.   Spleen: No splenic mass or enlargement.   Adrenals/Urinary Tract: No obstructing stone or hydronephrosis. Right renal cyst. No worrisome renal or adrenal lesion.   Noncontrast filled views of the urinary bladder unremarkable.   Stomach/Bowel: Sigmoid diverticulosis with significant associated muscular hypertrophy. This limits evaluation for detection of a mass. No extraluminal inflammation.   No inflammation surrounds the appendix or terminal ileum.   Small hiatal hernia. Thickening of the mucosa at this level may be related to under distension or inflammation.   Mild thickening gastric antrum may be related to under distension. Limited for evaluating for inflammation or mass at this level.   Vascular/Lymphatic: Atherosclerotic changes aorta and aortic branch vessels. No abdominal aortic aneurysm or large vessel occlusion.   Scattered normal size lymph nodes.   Reproductive: Small calcified uterine fibroid. No worrisome adnexal mass.   Other: No free air or bowel containing hernia. Fatty replaced right rectus muscle.   Musculoskeletal: Remote moderate-size Schmorl's node deformity/superior endplate compression deformity T12 and L1 with 30% loss of height T12 and up to 50% loss of height L1. No retropulsion. Facet degenerative changes L4-5 with mild anterior slip L4 and bulge. Multifactorial  prominent L4-5 spinal stenosis and foraminal narrowing. Bony overgrowth ischium bilaterally.   IMPRESSION: 1. Sigmoid diverticulosis with significant associated muscular hypertrophy. This limits evaluation for detection of a mass. No extraluminal inflammation. 2. Small hiatal hernia. Thickening of the mucosa at this level may be related to under distension or inflammation. 3. Mild thickening gastric antrum may be related to under distension. Limited for evaluating for inflammation or mass at this level. 4. Lobulated contour of the liver. It is possible this is related to prior surgery for cyst removal but cannot exclude cirrhosis. 5. Aortic Atherosclerosis (ICD10-I70.0). Atherosclerotic changes aorta and aortic branch vessels. No abdominal aortic aneurysm or large vessel occlusion. 6. Remote moderate-size Schmorl's node deformity/superior endplate compression deformity T12 and L1 with 30% loss of height T12 and up to 50% loss of height L1. No retropulsion. 7. Facet degenerative changes L4-5 with mild anterior slip L4 and bulge. Multifactorial prominent L4-5 spinal stenosis and foraminal narrowing.     Electronically Signed   By: Genia Del M.D.   On: 04/10/2018 12:16   @ASSESSMENTPLANBEGIN @ Assessment:  Encounter Diagnoses  Name Primary?  . Dyspepsia Yes  . Belching   . Elevated amylase    Nonspecific symptoms of belching and dyspepsia of unclear cause.  It also seems the elevated amylase is unrelated and does not indicate pancreatitis in this case.  An upper endoscopy would likely be of low yield.  Celiac sprue reportedly can cause elevated amylase, but there are number of other medical conditions worth considering, and Kristen Schultz plans to follow-up with Kristen Schultz about this. Kristen Schultz will also get her chemistry panel checked next week to see if the sodium is improved after stopping the HCTZ today.  I offered to check celiac antibodies, but Kristen Schultz thinks Kristen Schultz may just go on a  gluten-free diet for some time and see how Kristen Schultz feels.  Kristen Schultz should resume a solid food diet since Kristen Schultz does not have pancreatitis.  Total time 20 minutes, over half spent face-to-face with patient in counseling and coordination of care.   Nelida Meuse III

## 2018-04-13 NOTE — Telephone Encounter (Signed)
Per Dr. Etter Sjogren advise patient to hold the losartan/hctz.  The hctz part of the losartan can cause an elevation in the amylase per GI.  Rx sent in for losartan rx.

## 2018-04-14 ENCOUNTER — Encounter: Payer: Self-pay | Admitting: Gastroenterology

## 2018-04-14 ENCOUNTER — Telehealth: Payer: Self-pay | Admitting: *Deleted

## 2018-04-14 ENCOUNTER — Ambulatory Visit: Payer: Medicare HMO | Admitting: Gastroenterology

## 2018-04-14 VITALS — BP 124/60 | HR 76 | Ht 65.0 in | Wt 159.0 lb

## 2018-04-14 DIAGNOSIS — R142 Eructation: Secondary | ICD-10-CM

## 2018-04-14 DIAGNOSIS — R1013 Epigastric pain: Secondary | ICD-10-CM | POA: Diagnosis not present

## 2018-04-14 DIAGNOSIS — R748 Abnormal levels of other serum enzymes: Secondary | ICD-10-CM | POA: Diagnosis not present

## 2018-04-14 NOTE — Patient Instructions (Addendum)
If you are age 73 or older, your body mass index should be between 23-30. Your Body mass index is 26.46 kg/m². If this is out of the aforementioned range listed, please consider follow up with your Primary Care Provider. ° °If you are age 64 or younger, your body mass index should be between 19-25. Your Body mass index is 26.46 kg/m². If this is out of the aformentioned range listed, please consider follow up with your Primary Care Provider.  ° °Follow up as needed ° °It was a pleasure to see you today! ° °Dr. Danis ° °

## 2018-04-14 NOTE — Telephone Encounter (Signed)
Dr. Etter Sjogren spoke with patient yesterday evening.

## 2018-04-14 NOTE — Telephone Encounter (Signed)
-----   Message from [name], [place] sent at 04/11/2018  9:24 AM EST ----- Please call and let pt know I spoke with Dr Loletha Carrow this am.  He thinks the elevated amylase is a red herring---- he will look at her CT and labs and discuss further with her on Friday.   I hope she is feeling better.

## 2018-04-17 ENCOUNTER — Other Ambulatory Visit: Payer: Self-pay | Admitting: Family Medicine

## 2018-04-17 DIAGNOSIS — R748 Abnormal levels of other serum enzymes: Secondary | ICD-10-CM

## 2018-04-17 DIAGNOSIS — R7989 Other specified abnormal findings of blood chemistry: Secondary | ICD-10-CM

## 2018-04-19 ENCOUNTER — Telehealth: Payer: Self-pay | Admitting: *Deleted

## 2018-04-19 ENCOUNTER — Encounter: Payer: Self-pay | Admitting: Family Medicine

## 2018-04-19 DIAGNOSIS — R7989 Other specified abnormal findings of blood chemistry: Secondary | ICD-10-CM

## 2018-04-19 NOTE — Telephone Encounter (Signed)
-----   Message from [name], DO sent at 04/17/2018 11:35 AM EST ----- Need to repeat amylase and cmp--- order is in

## 2018-04-19 NOTE — Telephone Encounter (Signed)
Left message for patient to call back to make lab only appt.

## 2018-05-01 ENCOUNTER — Other Ambulatory Visit (INDEPENDENT_AMBULATORY_CARE_PROVIDER_SITE_OTHER): Payer: Medicare HMO

## 2018-05-01 DIAGNOSIS — R748 Abnormal levels of other serum enzymes: Secondary | ICD-10-CM | POA: Diagnosis not present

## 2018-05-01 DIAGNOSIS — R7989 Other specified abnormal findings of blood chemistry: Secondary | ICD-10-CM

## 2018-05-01 LAB — MICROALBUMIN / CREATININE URINE RATIO
Creatinine,U: 98.4 mg/dL
Microalb Creat Ratio: 0.7 mg/g (ref 0.0–30.0)
Microalb, Ur: <0.7 mg/dL (ref 0.0–1.9)

## 2018-05-01 LAB — AMYLASE: Amylase: 417 U/L — ABNORMAL HIGH (ref 27–131)

## 2018-05-01 LAB — COMPREHENSIVE METABOLIC PANEL
ALT: 12 U/L (ref 0–35)
AST: 19 U/L (ref 0–37)
Albumin: 4.1 g/dL (ref 3.5–5.2)
Alkaline Phosphatase: 63 U/L (ref 39–117)
BUN: 12 mg/dL (ref 6–23)
CO2: 30 mEq/L (ref 19–32)
Calcium: 9.3 mg/dL (ref 8.4–10.5)
Chloride: 102 mEq/L (ref 96–112)
Creatinine, Ser: 0.7 mg/dL (ref 0.40–1.20)
GFR: 82.22 mL/min (ref >60.00)
GLUCOSE: 74 mg/dL (ref 70–99)
Potassium: 5.3 mEq/L — ABNORMAL HIGH (ref 3.5–5.1)
Sodium: 140 mEq/L (ref 135–145)
Total Bilirubin: 0.9 mg/dL (ref 0.2–1.2)
Total Protein: 6.6 g/dL (ref 6.0–8.3)

## 2018-05-02 ENCOUNTER — Telehealth: Payer: Self-pay | Admitting: *Deleted

## 2018-05-02 ENCOUNTER — Encounter: Payer: Self-pay | Admitting: Family Medicine

## 2018-05-02 ENCOUNTER — Other Ambulatory Visit: Payer: Self-pay | Admitting: Family Medicine

## 2018-05-02 DIAGNOSIS — R748 Abnormal levels of other serum enzymes: Secondary | ICD-10-CM

## 2018-05-02 NOTE — Telephone Encounter (Signed)
Patient states that she drinks about 3-4 glasses of wine every night.  She has no abdominal pain.  She has been burping a lot and fatigue.  appt scheduled for 2 weeks to recheck amylase.

## 2018-05-02 NOTE — Telephone Encounter (Signed)
-----   Message from [name], DO sent at 05/02/2018  9:25 AM EST ----- We need to recheck cmp this week and amylase-- it has gone up- Is she having any abd pain?   I will need to discuss with another specialist since the GI dr does not feel its her pancreas and the stopping the hctz does not help

## 2018-05-02 NOTE — Telephone Encounter (Signed)
See labs results

## 2018-05-16 ENCOUNTER — Other Ambulatory Visit (INDEPENDENT_AMBULATORY_CARE_PROVIDER_SITE_OTHER): Payer: Medicare HMO

## 2018-05-16 ENCOUNTER — Other Ambulatory Visit: Payer: Self-pay | Admitting: Family Medicine

## 2018-05-16 DIAGNOSIS — R748 Abnormal levels of other serum enzymes: Secondary | ICD-10-CM

## 2018-05-16 LAB — COMPREHENSIVE METABOLIC PANEL
ALT: 15 U/L (ref 0–35)
AST: 20 U/L (ref 0–37)
Albumin: 4 g/dL (ref 3.5–5.2)
Alkaline Phosphatase: 53 U/L (ref 39–117)
BUN: 16 mg/dL (ref 6–23)
CO2: 28 mEq/L (ref 19–32)
CREATININE: 0.67 mg/dL (ref 0.40–1.20)
Calcium: 9.5 mg/dL (ref 8.4–10.5)
Chloride: 104 mEq/L (ref 96–112)
GFR: 86.47 mL/min (ref >60.00)
Glucose, Bld: 77 mg/dL (ref 70–99)
Potassium: 4.8 mEq/L (ref 3.5–5.1)
Sodium: 139 mEq/L (ref 135–145)
Total Bilirubin: 0.5 mg/dL (ref 0.2–1.2)
Total Protein: 6.6 g/dL (ref 6.0–8.3)

## 2018-05-16 LAB — LIPASE: Lipase: 17 U/L (ref 11.0–59.0)

## 2018-05-16 LAB — AMYLASE: Amylase: 404 U/L — ABNORMAL HIGH (ref 27–131)

## 2018-05-17 ENCOUNTER — Other Ambulatory Visit: Payer: Medicare HMO

## 2018-05-17 ENCOUNTER — Other Ambulatory Visit: Payer: Self-pay | Admitting: Family Medicine

## 2018-05-17 ENCOUNTER — Encounter: Payer: Self-pay | Admitting: Family Medicine

## 2018-05-17 DIAGNOSIS — R748 Abnormal levels of other serum enzymes: Secondary | ICD-10-CM | POA: Diagnosis not present

## 2018-05-17 DIAGNOSIS — I10 Essential (primary) hypertension: Secondary | ICD-10-CM

## 2018-05-17 NOTE — Addendum Note (Signed)
Addended by: Kelle Darting A on: 05/17/2018 07:34 AM   Modules accepted: Orders

## 2018-05-18 ENCOUNTER — Other Ambulatory Visit (INDEPENDENT_AMBULATORY_CARE_PROVIDER_SITE_OTHER): Payer: Medicare HMO

## 2018-05-18 DIAGNOSIS — I10 Essential (primary) hypertension: Secondary | ICD-10-CM | POA: Diagnosis not present

## 2018-05-18 DIAGNOSIS — R748 Abnormal levels of other serum enzymes: Secondary | ICD-10-CM

## 2018-05-18 LAB — COMPREHENSIVE METABOLIC PANEL
ALT: 16 U/L (ref 0–35)
AST: 19 U/L (ref 0–37)
Albumin: 4.2 g/dL (ref 3.5–5.2)
Alkaline Phosphatase: 54 U/L (ref 39–117)
BILIRUBIN TOTAL: 0.4 mg/dL (ref 0.2–1.2)
BUN: 14 mg/dL (ref 6–23)
CO2: 29 mEq/L (ref 19–32)
Calcium: 9.8 mg/dL (ref 8.4–10.5)
Chloride: 105 mEq/L (ref 96–112)
Creatinine, Ser: 0.8 mg/dL (ref 0.40–1.20)
GFR: 70.47 mL/min (ref >60.00)
Glucose, Bld: 86 mg/dL (ref 70–99)
Potassium: 5.1 mEq/L (ref 3.5–5.1)
Sodium: 142 mEq/L (ref 135–145)
Total Protein: 6.8 g/dL (ref 6.0–8.3)

## 2018-05-18 LAB — LIPID PANEL
Cholesterol: 210 mg/dL — ABNORMAL HIGH (ref 0–200)
HDL: 61.1 mg/dL (ref >39.00)
LDL Cholesterol: 134 mg/dL — ABNORMAL HIGH (ref 0–99)
NonHDL: 148.86
Total CHOL/HDL Ratio: 3
Triglycerides: 75 mg/dL (ref 0.0–149.0)
VLDL: 15 mg/dL (ref 0.0–40.0)

## 2018-05-18 LAB — CBC WITH DIFFERENTIAL/PLATELET
Basophils Absolute: 0.1 10*3/uL (ref 0.0–0.1)
Basophils Relative: 2.4 % (ref 0.0–3.0)
Eosinophils Absolute: 0.5 10*3/uL (ref 0.0–0.7)
Eosinophils Relative: 8.7 % — ABNORMAL HIGH (ref 0.0–5.0)
HCT: 46.8 % — ABNORMAL HIGH (ref 36.0–46.0)
Hemoglobin: 15.9 g/dL — ABNORMAL HIGH (ref 12.0–15.0)
Lymphocytes Relative: 25.1 % (ref 12.0–46.0)
Lymphs Abs: 1.5 10*3/uL (ref 0.7–4.0)
MCHC: 34 g/dL (ref 30.0–36.0)
MCV: 96.5 fl (ref 78.0–100.0)
MONOS PCT: 10.2 % (ref 3.0–12.0)
Monocytes Absolute: 0.6 10*3/uL (ref 0.1–1.0)
Neutro Abs: 3.2 10*3/uL (ref 1.4–7.7)
Neutrophils Relative %: 53.6 % (ref 43.0–77.0)
Platelets: 304 10*3/uL (ref 150.0–400.0)
RBC: 4.84 Mil/uL (ref 3.87–5.11)
RDW: 12.8 % (ref 11.5–15.5)
WBC: 5.9 10*3/uL (ref 4.0–10.5)

## 2018-05-18 LAB — SEDIMENTATION RATE: Sed Rate: 20 mm/h (ref 0–30)

## 2018-05-21 ENCOUNTER — Other Ambulatory Visit: Payer: Self-pay | Admitting: Family Medicine

## 2018-05-21 DIAGNOSIS — R768 Other specified abnormal immunological findings in serum: Secondary | ICD-10-CM

## 2018-05-21 LAB — AMYLASE ISOENZYMES
Amylase: 424 U/L — ABNORMAL HIGH (ref 21–101)
Isoamylase-Pancreatic: 471 U/L — ABNORMAL HIGH (ref 16–46)

## 2018-05-22 LAB — ANA: ANA: POSITIVE — AB

## 2018-05-22 LAB — RHEUMATOID FACTOR: Rheumatoid fact SerPl-aCnc: 25 IU/mL — ABNORMAL HIGH (ref <14)

## 2018-05-22 LAB — ANTI-NUCLEAR AB-TITER (ANA TITER): ANA Titer 1: 1:320 {titer} — ABNORMAL HIGH

## 2018-05-23 ENCOUNTER — Encounter: Payer: Self-pay | Admitting: Family Medicine

## 2018-05-23 ENCOUNTER — Ambulatory Visit (INDEPENDENT_AMBULATORY_CARE_PROVIDER_SITE_OTHER): Payer: Medicare HMO | Admitting: Family Medicine

## 2018-05-23 VITALS — BP 144/82 | HR 86 | Temp 98.0°F | Ht 65.0 in | Wt 154.0 lb

## 2018-05-23 DIAGNOSIS — Z0000 Encounter for general adult medical examination without abnormal findings: Secondary | ICD-10-CM | POA: Diagnosis not present

## 2018-05-23 DIAGNOSIS — K219 Gastro-esophageal reflux disease without esophagitis: Secondary | ICD-10-CM | POA: Diagnosis not present

## 2018-05-23 DIAGNOSIS — R768 Other specified abnormal immunological findings in serum: Secondary | ICD-10-CM

## 2018-05-23 DIAGNOSIS — I10 Essential (primary) hypertension: Secondary | ICD-10-CM | POA: Diagnosis not present

## 2018-05-23 DIAGNOSIS — R748 Abnormal levels of other serum enzymes: Secondary | ICD-10-CM

## 2018-05-23 NOTE — Patient Instructions (Signed)
Preventive Care 73 Years and Older, Female Preventive care refers to lifestyle choices and visits with your health care provider that can promote health and wellness. What does preventive care include?  A yearly physical exam. This is also called an annual well check.  Dental exams once or twice a year.  Routine eye exams. Ask your health care provider how often you should have your eyes checked.  Personal lifestyle choices, including: ? Daily care of your teeth and gums. ? Regular physical activity. ? Eating a healthy diet. ? Avoiding tobacco and drug use. ? Limiting alcohol use. ? Practicing safe sex. ? Taking low-dose aspirin every day. ? Taking vitamin and mineral supplements as recommended by your health care provider. What happens during an annual well check? The services and screenings done by your health care provider during your annual well check will depend on your age, overall health, lifestyle risk factors, and family history of disease. Counseling Your health care provider may ask you questions about your:  Alcohol use.  Tobacco use.  Drug use.  Emotional well-being.  Home and relationship well-being.  Sexual activity.  Eating habits.  History of falls.  Memory and ability to understand (cognition).  Work and work Statistician.  Reproductive health.  Screening You may have the following tests or measurements:  Height, weight, and BMI.  Blood pressure.  Lipid and cholesterol levels. These may be checked every 5 years, or more frequently if you are over 30 years old.  Skin check.  Lung cancer screening. You may have this screening every year starting at age 27 if you have a 30-pack-year history of smoking and currently smoke or have quit within the past 15 years.  Colorectal cancer screening. All adults should have this screening starting at age 33 and continuing until age 46. You will have tests every 1-10 years, depending on your results and the  type of screening test. People at increased risk should start screening at an earlier age. Screening tests may include: ? Guaiac-based fecal occult blood testing. ? Fecal immunochemical test (FIT). ? Stool DNA test. ? Virtual colonoscopy. ? Sigmoidoscopy. During this test, a flexible tube with a tiny camera (sigmoidoscope) is used to examine your rectum and lower colon. The sigmoidoscope is inserted through your anus into your rectum and lower colon. ? Colonoscopy. During this test, a long, thin, flexible tube with a tiny camera (colonoscope) is used to examine your entire colon and rectum.  Hepatitis C blood test.  Hepatitis B blood test.  Sexually transmitted disease (STD) testing.  Diabetes screening. This is done by checking your blood sugar (glucose) after you have not eaten for a while (fasting). You may have this done every 1-3 years.  Bone density scan. This is done to screen for osteoporosis. You may have this done starting at age 37.  Mammogram. This may be done every 1-2 years. Talk to your health care provider about how often you should have regular mammograms. Talk with your health care provider about your test results, treatment options, and if necessary, the need for more tests. Vaccines Your health care provider may recommend certain vaccines, such as:  Influenza vaccine. This is recommended every year.  Tetanus, diphtheria, and acellular pertussis (Tdap, Td) vaccine. You may need a Td booster every 10 years.  Varicella vaccine. You may need this if you have not been vaccinated.  Zoster vaccine. You may need this after age 38.  Measles, mumps, and rubella (MMR) vaccine. You may need at least  one dose of MMR if you were born in 1957 or later. You may also need a second dose.  Pneumococcal 13-valent conjugate (PCV13) vaccine. One dose is recommended after age 24.  Pneumococcal polysaccharide (PPSV23) vaccine. One dose is recommended after age 24.  Meningococcal  vaccine. You may need this if you have certain conditions.  Hepatitis A vaccine. You may need this if you have certain conditions or if you travel or work in places where you may be exposed to hepatitis A.  Hepatitis B vaccine. You may need this if you have certain conditions or if you travel or work in places where you may be exposed to hepatitis B.  Haemophilus influenzae type b (Hib) vaccine. You may need this if you have certain conditions. Talk to your health care provider about which screenings and vaccines you need and how often you need them. This information is not intended to replace advice given to you by your health care provider. Make sure you discuss any questions you have with your health care provider. Document Released: 04/04/2015 Document Revised: 04/28/2017 Document Reviewed: 01/07/2015 Elsevier Interactive Patient Education  2019 [employer].

## 2018-05-24 DIAGNOSIS — R768 Other specified abnormal immunological findings in serum: Secondary | ICD-10-CM | POA: Insufficient documentation

## 2018-05-24 DIAGNOSIS — R748 Abnormal levels of other serum enzymes: Secondary | ICD-10-CM | POA: Insufficient documentation

## 2018-05-24 DIAGNOSIS — K219 Gastro-esophageal reflux disease without esophagitis: Secondary | ICD-10-CM | POA: Insufficient documentation

## 2018-05-24 DIAGNOSIS — Z0000 Encounter for general adult medical examination without abnormal findings: Secondary | ICD-10-CM | POA: Insufficient documentation

## 2018-05-24 NOTE — Progress Notes (Signed)
Subjective:     Kristen Schultz is a 73 y.o. female and is here for a comprehensive physical exam. The patient reports problems - she would like to go over her labs and elevated amylase and would like to switch to [facility] for GI due to location .  She is still burping a lot and states she called Dr Loletha Carrow and left a message but has not heard back.  She states she would like to switch to [facility] office due to location.    Social History   Socioeconomic History  . Marital status: Married    Spouse name: [name]  . Number of children: 1  . Years of education: Not on file  . Highest education level: Not on file  Occupational History  . Occupation: retired    Comment: retired  Scientific laboratory technician  . Financial resource strain: Not on file  . Food insecurity:    Worry: Not on file    Inability: Not on file  . Transportation needs:    Medical: Not on file    Non-medical: Not on file  Tobacco Use  . Smoking status: Former Smoker    Packs/day: 0.30    Years: 15.00    Pack years: 4.50    Last attempt to quit: 11/17/1985    Years since quitting: 32.5  . Smokeless tobacco: Never Used  Substance and Sexual Activity  . Alcohol use: Yes    Alcohol/week: 14.0 standard drinks    Types: 14 Glasses of wine per week    Comment: 2 glasses per night  . Drug use: No  . Sexual activity: Not Currently    Partners: Male  Lifestyle  . Physical activity:    Days per week: Not on file    Minutes per session: Not on file  . Stress: Not on file  Relationships  . Social connections:    Talks on phone: Not on file    Gets together: Not on file    Attends religious service: Not on file    Active member of club or organization: Not on file    Attends meetings of clubs or organizations: Not on file    Relationship status: Not on file  . Intimate partner violence:    Fear of current or ex partner: Not on file    Emotionally abused: Not on file    Physically abused: Not on file    Forced sexual activity: Not on file  Other  Topics Concern  . Not on file  Social History Narrative   Exercise- Walks about 15-22 miles per week with womens group.   Health Maintenance  Topic Date Due  . MAMMOGRAM  01/10/2019  . TETANUS/TDAP  03/23/2019  . DEXA SCAN  01/10/2020  . COLONOSCOPY  02/09/2021  . INFLUENZA VACCINE  Completed  . Hepatitis C Screening  Completed  . PNA vac Low Risk Adult  Completed    The following portions of the patient's history were reviewed and updated as appropriate:  She  has a past medical history of Anxiety, Colon polyps, Gastric ulcer, Hyperlipidemia, Hypertension, Liver cyst, Ovarian cyst (1984), and Pneumonia. She does not have any pertinent problems on file. She  has a past surgical history that includes Hemorrhoid surgery; Laparoscopic liver cyst removal; Ovarian cyst surgery; and Dental surgery (2000). Her family history includes Heart disease in her brother and father; Heart disease (age of onset: 84) in her brother. She  reports that she quit smoking about 32 years ago. She has  a 4.50 pack-year smoking history. She has never used smokeless tobacco. She reports current alcohol use of about 14.0 standard drinks of alcohol per week. She reports that she does not use drugs. She has a current medication list which includes the following prescription(s): aspirin, citalopram, famotidine, losartan, and multivitamin. Current Outpatient Medications on File Prior to Visit  Medication Sig Dispense Refill  . aspirin 81 MG tablet Take 81 mg by mouth daily.    . citalopram (CELEXA) 10 MG tablet TAKE 1 TABLET BY MOUTH EVERY DAY 90 tablet 1  . famotidine (PEPCID) 40 MG tablet Take 1 tablet (40 mg total) by mouth daily. 30 tablet 5  . losartan (COZAAR) 50 MG tablet Take 50 mg by mouth daily.    . Multiple Vitamin (MULTIVITAMIN) capsule Take 1 capsule by mouth daily.     No current facility-administered medications on file prior to visit.    She is allergic to bee venom; codeine; penicillins; and  tetracyclines & related..  Review of Systems Review of Systems  Constitutional: Negative for activity change, appetite change and fatigue.  HENT: Negative for hearing loss, congestion, tinnitus and ear discharge.  dentist q46m Eyes: Negative for visual disturbance (see optho q1y -- vision corrected to 20/20 with glasses).  Respiratory: Negative for cough, chest tightness and shortness of breath.   Cardiovascular: Negative for chest pain, palpitations and leg swelling.  Gastrointestinal: Negative for abdominal pain, diarrhea, constipation and abdominal distention.  Genitourinary: Negative for urgency, frequency, decreased urine volume and difficulty urinating.  Musculoskeletal: Negative for back pain, arthralgias and gait problem.  Skin: Negative for color change, pallor and rash.  Neurological: Negative for dizziness, light-headedness, numbness and headaches.  Hematological: Negative for adenopathy. Does not bruise/bleed easily.  Psychiatric/Behavioral: Negative for suicidal ideas, confusion, sleep disturbance, self-injury, dysphoric mood, decreased concentration and agitation.       Objective:    BP (!) 144/82   Pulse 86   Temp 98 F (36.7 C)   Ht 5\' 5"  (1.651 m)   Wt 154 lb (69.9 kg)   SpO2 98%   BMI 25.63 kg/m  General appearance: alert, cooperative, appears stated age and no distress Head: Normocephalic, without obvious abnormality, atraumatic Eyes: negative findings: lids and lashes normal, conjunctivae and sclerae normal and pupils equal, round, reactive to light and accomodation Ears: normal TM's and external ear canals both ears Nose: Nares normal. Septum midline. Mucosa normal. No drainage or sinus tenderness. Throat: lips, mucosa, and tongue normal; teeth and gums normal Neck: no adenopathy, no carotid bruit, no JVD, supple, symmetrical, trachea midline and thyroid not enlarged, symmetric, no tenderness/mass/nodules Back: symmetric, no curvature. ROM normal. No CVA  tenderness. Lungs: clear to auscultation bilaterally Breasts: normal appearance, no masses or tenderness Heart: regular rate and rhythm, S1, S2 normal, no murmur, click, rub or gallop Abdomen: soft, non-tender; bowel sounds normal; no masses,  no organomegaly Extremities: extremities normal, atraumatic, no cyanosis or edema Pulses: 2+ and symmetric Skin: Skin color, texture, turgor normal. No rashes or lesions Lymph nodes: Cervical, supraclavicular, and axillary nodes normal. Neurologic: Alert and oriented X 3, normal strength and tone. Normal symmetric reflexes. Normal coordination and gait    Assessment:    Healthy female exam.      Plan:     ghm utd Labs reviewed with pt  See After Visit Summary for Counseling Recommendations    1. Preventative health care See above   2. Gastroesophageal reflux disease, esophagitis presence not specified con't meds  Pt requesting to  switch to [facility]  - Ambulatory referral to Gastroenterology  3. Elevated amylase F/u GI  And referral in for rheum  - Ambulatory referral to Gastroenterology  4. Essential hypertension Well controlled, no changes to meds. Encouraged heart healthy diet such as the DASH diet and exercise as tolerated.    5. Elevated rheumatoid factor Refer to rheum pending

## 2018-05-24 NOTE — Assessment & Plan Note (Signed)
iso enzymes done---  CT/ US done -- normal F/u GI  Pt also referred to Rheum -- was told she might have sjogren syndrome in the past

## 2018-05-24 NOTE — Assessment & Plan Note (Signed)
con't PPI F/u GI -- will transfer to [facility] at pt request

## 2018-05-24 NOTE — Assessment & Plan Note (Signed)
With joint pains Refer to rheum  Pt also told she might have sjogrens in the past ---  C/o of joint issues

## 2018-05-24 NOTE — Assessment & Plan Note (Signed)
Well controlled, no changes to meds. Encouraged heart healthy diet such as the DASH diet and exercise as tolerated.  °

## 2018-05-24 NOTE — Assessment & Plan Note (Signed)
ghm utd Check labs See AVS 

## 2018-05-26 ENCOUNTER — Telehealth: Payer: Self-pay | Admitting: Gastroenterology

## 2018-05-29 NOTE — Telephone Encounter (Signed)
Dr. Lyndel Safe will you accept this pt? Please see notes below and advise.

## 2018-05-29 NOTE — Telephone Encounter (Signed)
No problems Kristen Schultz

## 2018-05-29 NOTE — Telephone Encounter (Signed)
It is fine with me if Dr. Lyndel Safe agreeable.  - [facility]

## 2018-05-30 NOTE — Telephone Encounter (Signed)
See note below, pt may see Dr. Lyndel Safe.

## 2018-06-02 ENCOUNTER — Encounter: Payer: Self-pay | Admitting: Gastroenterology

## 2018-06-02 NOTE — Telephone Encounter (Signed)
Patient scheduled to see Dr.Gupta 06/29/2018 at 8:45am.

## 2018-06-05 DIAGNOSIS — M2550 Pain in unspecified joint: Secondary | ICD-10-CM | POA: Diagnosis not present

## 2018-06-05 DIAGNOSIS — Z6826 Body mass index (BMI) 26.0-26.9, adult: Secondary | ICD-10-CM | POA: Diagnosis not present

## 2018-06-05 DIAGNOSIS — R768 Other specified abnormal immunological findings in serum: Secondary | ICD-10-CM | POA: Diagnosis not present

## 2018-06-05 DIAGNOSIS — E663 Overweight: Secondary | ICD-10-CM | POA: Diagnosis not present

## 2018-06-05 DIAGNOSIS — M150 Primary generalized (osteo)arthritis: Secondary | ICD-10-CM | POA: Diagnosis not present

## 2018-06-05 DIAGNOSIS — R748 Abnormal levels of other serum enzymes: Secondary | ICD-10-CM | POA: Diagnosis not present

## 2018-06-12 ENCOUNTER — Encounter: Payer: Self-pay | Admitting: Family Medicine

## 2018-06-14 ENCOUNTER — Telehealth: Payer: Self-pay | Admitting: *Deleted

## 2018-06-14 NOTE — Telephone Encounter (Signed)
Received Radiology Report results from [place] via [facility]; forwarded to provider/SLS 03/25

## 2018-06-29 ENCOUNTER — Telehealth: Payer: Medicare HMO | Admitting: Gastroenterology

## 2018-06-29 ENCOUNTER — Encounter: Payer: Self-pay | Admitting: Gastroenterology

## 2018-06-29 ENCOUNTER — Other Ambulatory Visit: Payer: Self-pay

## 2018-06-29 ENCOUNTER — Other Ambulatory Visit (INDEPENDENT_AMBULATORY_CARE_PROVIDER_SITE_OTHER): Payer: Medicare HMO

## 2018-06-29 VITALS — Ht 65.0 in | Wt 155.0 lb

## 2018-06-29 DIAGNOSIS — R748 Abnormal levels of other serum enzymes: Secondary | ICD-10-CM

## 2018-06-29 DIAGNOSIS — K219 Gastro-esophageal reflux disease without esophagitis: Secondary | ICD-10-CM | POA: Diagnosis not present

## 2018-06-29 DIAGNOSIS — K449 Diaphragmatic hernia without obstruction or gangrene: Secondary | ICD-10-CM

## 2018-06-29 DIAGNOSIS — R768 Other specified abnormal immunological findings in serum: Secondary | ICD-10-CM

## 2018-06-29 DIAGNOSIS — R7689 Other specified abnormal immunological findings in serum: Secondary | ICD-10-CM

## 2018-06-29 NOTE — Patient Instructions (Addendum)
  Continue Pepcid 40mg  po qAM.   EGD with SB Bx once Covid-19 scare is over. We will contact you at a later date to schedule    CBC, CMP, amylase, lipase, celiac screen, CRP, sed rate Anti RO (SSA)  Anti LA (SSB)    Check urine creatinine and amylase (to calculate ACCR)   Orders are in for you to come to [facility] 2nd floor at your convenience to get drawn

## 2018-06-29 NOTE — Progress Notes (Signed)
Chief Complaint:   Referring Provider:  Carollee Herter, Alferd Apa, *      ASSESSMENT AND PLAN;   #1. GERD with small HH On CT (CT 03/2018 -also showed gastric wall thickening)  #2. ?Sjogren's with positive RA/ANA (being evaluated by Rheumatology, awaiting cheek Bx). No clinical signs of scleroderma.  #3.  Asymptomatic elevated amylase, Nl Lipase. Neg CT, [place] 03/2018 (Nl pancreas). 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 Neg salivary amylase. Could be associated with #2.  No abdominal pain. Off HCTZ.  Plan: - Continue Pepcid 40mg  po qAM. - EGD with SB Bx once Covid-19 scare is over. - Check CBC, CMP, amylase, lipase, celiac screen, CRP, sed rate. Also check anti Ro (SSA) and anti La(SSB) while we are drawing blood as well. - Check urine creatinine and amylase (to calculate ACCR-amylase to creatinine clearance ratio). - FU after above is complete.   HPI:    Kristen Schultz is a 73 y.o. female  Doing well Has longstanding history of gastroesophageal reflux, burping and abdominal bloating. Much better on Pepcid 40 mg p.o. once a day She has tried multiple medications and trial of gluten-free diet previously as well. No odynophagia or dysphagia No melena or hematochezia.  Denies having any significant diarrhea or constipation.  Has elevated amylase with normal lipase.  Got isoenzymes-predominantly pancreatic rather than salivary.  Pancreas look normal on CT scan and ultrasound January 2020.  Had liver scar consistent with previous resection of liver cyst.  On review I do not believe that she has significant cirrhosis.  Used to drink wine occasionally-which she has stopped as well.    Past GI procedures: -Colonoscopy 02/10/2016-4 mm polyp status post polypectomy, internal hemorrhoids, left colonic diverticulosis. Bx- TA.   Past Medical History:  Diagnosis Date  . Anxiety   . Colon polyps   . Gastric ulcer    per patient; in the 1970s  . Hyperlipidemia   . Hypertension   . Liver cyst   .  Ovarian cyst 1984  . Pneumonia     Past Surgical History:  Procedure Laterality Date  . DENTAL SURGERY  2000   dental implant  . HEMORRHOID SURGERY    . LAPAROSCOPIC LIVER CYST REMOVAL    . OVARIAN CYST SURGERY      Family History  Problem Relation Age of Onset  . Heart disease Father   . Heart disease Brother 9  . Heart disease Brother   . Glaucoma Neg Hx   . Colon cancer Neg Hx   . Esophageal cancer Neg Hx   . Pancreatic cancer Neg Hx   . Stomach cancer Neg Hx     Social History   Tobacco Use  . Smoking status: Former Smoker    Packs/day: 0.30    Years: 15.00    Pack years: 4.50    Last attempt to quit: 11/17/1985    Years since quitting: 32.6  . Smokeless tobacco: Never Used  Substance Use Topics  . Alcohol use: Yes    Alcohol/week: 14.0 standard drinks    Types: 14 Glasses of wine per week    Comment: 2 glasses per night  . Drug use: No    Current Outpatient Medications  Medication Sig Dispense Refill  . aspirin 81 MG tablet Take 81 mg by mouth daily. As needed    . citalopram (CELEXA) 10 MG tablet TAKE 1 TABLET BY MOUTH EVERY DAY 90 tablet 1  . famotidine (PEPCID) 40 MG tablet Take 1 tablet (40 mg total) by mouth  daily. 30 tablet 5  . losartan (COZAAR) 50 MG tablet Take 50 mg by mouth daily.    . Multiple Vitamin (MULTIVITAMIN) capsule Take 1 capsule by mouth daily.     No current facility-administered medications for this visit.     Allergies  Allergen Reactions  . Bee Venom Anaphylaxis    Bee stings  . Codeine     Extreme Vomiting  . Penicillins     Redness, swelling and itching Can take Keflex  . Tetracyclines & Related     Yeast Infection and Stomach problems    Review of Systems:  neg     Physical Exam:    Ht 5\' 5"  (1.651 m)   Wt 155 lb (70.3 kg)   BMI 25.79 kg/m  Filed Weights   06/29/18 0806  Weight: 155 lb (70.3 kg)   Not examined since it was a tele-visit  CBC: CBC Latest Ref Rng & Units 05/18/2018 04/03/2018 01/07/2017   WBC 4.0 - 10.5 K/uL 5.9 6.6 6.9  Hemoglobin 12.0 - 15.0 g/dL 15.9(H) 14.7 14.6  Hematocrit 36.0 - 46.0 % 46.8(H) 43.6 43.6  Platelets 150.0 - 400.0 K/uL 304.0 282.0 294.0    CMP: CMP Latest Ref Rng & Units 05/18/2018 05/16/2018 05/01/2018  Glucose 70 - 99 mg/dL 86 77 74  BUN 6 - 23 mg/dL 14 16 12   Creatinine 0.40 - 1.20 mg/dL 0.80 0.67 0.70  Sodium 135 - 145 mEq/L 142 139 140  Potassium 3.5 - 5.1 mEq/L 5.1 4.8 5.3(H)  Chloride 96 - 112 mEq/L 105 104 102  CO2 19 - 32 mEq/L 29 28 30   Calcium 8.4 - 10.5 mg/dL 9.8 9.5 9.3  Total Protein 6.0 - 8.3 g/dL 6.8 6.6 6.6  Total Bilirubin 0.2 - 1.2 mg/dL 0.4 0.5 0.9  Alkaline Phos 39 - 117 U/L 54 53 63  AST 0 - 37 U/L 19 20 19   ALT 0 - 35 U/L 16 15 12    Amylase    Component Value Date/Time   AMYLASE 424 (H) 05/17/2018 0912   Lipase     Component Value Date/Time   LIPASE 17.0 05/16/2018 0927     This service was provided via telemedicine.  The patient was located at home.  The provider was located in office.  The patient did consent to this telephone visit and is aware of possible charges through their insurance for this visit.   Time spent on call and coordinating care: 25 min     Carmell Austria, MD 06/29/2018, 8:43 AM  Cc: Carollee Herter, Alferd Apa, *

## 2018-06-30 LAB — COMPREHENSIVE METABOLIC PANEL
ALT: 12 IU/L (ref 0–32)
AST: 21 IU/L (ref 0–40)
Albumin/Globulin Ratio: 1.8 (ref 1.2–2.2)
Albumin: 4.1 g/dL (ref 3.7–4.7)
Alkaline Phosphatase: 54 IU/L (ref 39–117)
BUN/Creatinine Ratio: 18 (ref 12–28)
BUN: 13 mg/dL (ref 8–27)
Bilirubin Total: 0.4 mg/dL (ref 0.0–1.2)
CO2: 22 mmol/L (ref 20–29)
Calcium: 9.4 mg/dL (ref 8.7–10.3)
Chloride: 101 mmol/L (ref 96–106)
Creatinine, Ser: 0.72 mg/dL (ref 0.57–1.00)
GFR calc Af Amer: 97 mL/min/{1.73_m2} (ref >59)
GFR calc non Af Amer: 84 mL/min/{1.73_m2} (ref >59)
Globulin, Total: 2.3 g/dL (ref 1.5–4.5)
Glucose: 111 mg/dL — ABNORMAL HIGH (ref 65–99)
Potassium: 5.3 mmol/L — ABNORMAL HIGH (ref 3.5–5.2)
Sodium: 140 mmol/L (ref 134–144)
Total Protein: 6.4 g/dL (ref 6.0–8.5)

## 2018-06-30 LAB — CBC WITH DIFFERENTIAL/PLATELET
Basophils Absolute: 0.1 10*3/uL (ref 0.0–0.2)
Basos: 1 %
EOS (ABSOLUTE): 0.4 10*3/uL (ref 0.0–0.4)
Eos: 5 %
Hematocrit: 41.5 % (ref 34.0–46.6)
Hemoglobin: 13.6 g/dL (ref 11.1–15.9)
Immature Grans (Abs): 0 10*3/uL (ref 0.0–0.1)
Immature Granulocytes: 0 %
Lymphocytes Absolute: 1.4 10*3/uL (ref 0.7–3.1)
Lymphs: 20 %
MCH: 30.8 pg (ref 26.6–33.0)
MCHC: 32.8 g/dL (ref 31.5–35.7)
MCV: 94 fL (ref 79–97)
Monocytes Absolute: 0.6 10*3/uL (ref 0.1–0.9)
Monocytes: 9 %
Neutrophils Absolute: 4.5 10*3/uL (ref 1.4–7.0)
Neutrophils: 65 %
Platelets: 286 10*3/uL (ref 150–450)
RBC: 4.41 x10E6/uL (ref 3.77–5.28)
RDW: 12.4 % (ref 11.7–15.4)
WBC: 7 10*3/uL (ref 3.4–10.8)

## 2018-06-30 LAB — SJOGRENS SYNDROME-A EXTRACTABLE NUCLEAR ANTIBODY: ENA SSA (RO) Ab: <0.2 AI (ref 0.0–0.9)

## 2018-06-30 LAB — AMYLASE: Amylase: 549 U/L (ref 31–110)

## 2018-06-30 LAB — AMYLASE, RANDOM URINE: Amylase, Ur: 109 U/L

## 2018-06-30 LAB — SEDIMENTATION RATE: Sed Rate: 17 mm/hr (ref 0–40)

## 2018-06-30 LAB — GLIADIN ANTIBODIES, SERUM
Antigliadin Abs, IgA: 3 units (ref 0–19)
Gliadin IgG: 1 units (ref 0–19)

## 2018-06-30 LAB — TISSUE TRANSGLUTAMINASE, IGA: Transglutaminase IgA: <2 U/mL (ref 0–3)

## 2018-06-30 LAB — SJOGRENS SYNDROME-B EXTRACTABLE NUCLEAR ANTIBODY: ENA SSB (LA) Ab: <0.2 AI (ref 0.0–0.9)

## 2018-06-30 LAB — LIPASE: Lipase: 29 U/L (ref 14–85)

## 2018-06-30 LAB — RETICULIN ANTIBODIES, IGA W TITER: Reticulin Ab, IgA: NEGATIVE titer (ref <2.5)

## 2018-06-30 LAB — HIGH SENSITIVITY CRP: CRP, High Sensitivity: 2.57 mg/L (ref 0.00–3.00)

## 2018-06-30 LAB — CREATININE, URINE, RANDOM: Creatinine, Urine: 66.1 mg/dL

## 2018-07-05 NOTE — Progress Notes (Signed)
Addendum:  -Calculated ACCR (amylase to creatinine clearance ratio) is 0.2 (less than 1) consistent with macroamylasemia. Neg celiac screen.  She has been evaluated by rheumatology.  Have left message for her.  C-reactive protein, sed rate, anti-Ro and anti-LA were negative

## 2018-07-10 ENCOUNTER — Telehealth: Payer: Self-pay | Admitting: *Deleted

## 2018-07-10 NOTE — Telephone Encounter (Signed)
Discussed in detail with the patient regarding lab tests  Plan: -EGD in 4 to 6 weeks at [facility] (see last note)

## 2018-07-10 NOTE — Telephone Encounter (Signed)
Has questions about results and she is worried about Amylase results and why it keeps increasing    Wants call back on cell phone from Dr Lyndel Safe this afternoon    Dr Lyndel Safe left her a message on Wednesday    Patient feels better since she has been outside gardening and doing outdoor activities    Has severe burping and questions about acid reflux medications    No Longer nauseated    Metallic taste in mouth when she coughs    Dr Lyndel Safe patient would like to speak to you directly this afternoon since you did leave her the message

## 2018-07-11 NOTE — Telephone Encounter (Signed)
Per office note  Schedule EGD after COVID19 scare is over.... Patient is on back log list to schedule

## 2018-08-10 ENCOUNTER — Ambulatory Visit: Payer: Medicare HMO | Admitting: *Deleted

## 2018-08-10 ENCOUNTER — Encounter: Payer: Self-pay | Admitting: Gastroenterology

## 2018-08-10 ENCOUNTER — Other Ambulatory Visit: Payer: Self-pay

## 2018-08-10 VITALS — Ht 65.0 in | Wt 155.0 lb

## 2018-08-10 DIAGNOSIS — K219 Gastro-esophageal reflux disease without esophagitis: Secondary | ICD-10-CM

## 2018-08-10 NOTE — Progress Notes (Signed)
Patient's pre-visit was done today with the patient over the phone. Insurance verified. Patient cannot take Lidocaine at dentis office-makes her pass out. Patient denies any allergies to eggs or soy. Patient denies any problems with anesthesia/sedation. Patient denies any oxygen use at home. Patient denies taking any diet/weight loss medications or blood thinners. EMMI education assisgned to patient on EGD, this was explained and instructions given to patient. EGD instructions mailed to patient today-pt is aware, she will call us back with any questions or concerns.

## 2018-08-16 ENCOUNTER — Telehealth: Payer: Self-pay

## 2018-08-16 NOTE — Telephone Encounter (Signed)
Attempted to call, phone rang and then went to a busy signal.

## 2018-08-17 ENCOUNTER — Telehealth: Payer: Self-pay | Admitting: *Deleted

## 2018-08-17 NOTE — Telephone Encounter (Signed)
LMOM to call back if any of the following is a yes- Covid-19 screening questions  Have you traveled in the last 14 days? If yes where?  Do you now or have you had a fever in the last 14 days?  Do you have any respiratory symptoms of shortness of breath or cough now or in the last 14 days?  Do you have any family members or close contacts with diagnosed or suspected Covid-19 in the past 14 days?  Have you been tested for Covid-19 and found to be positive?  Pt asked to wear a mask into building and informed that care partner will be waiting in the car during her procedure.

## 2018-08-18 ENCOUNTER — Other Ambulatory Visit: Payer: Self-pay

## 2018-08-18 ENCOUNTER — Ambulatory Visit (AMBULATORY_SURGERY_CENTER): Payer: Medicare HMO | Admitting: Gastroenterology

## 2018-08-18 ENCOUNTER — Encounter: Payer: Self-pay | Admitting: Gastroenterology

## 2018-08-18 VITALS — BP 168/77 | HR 65 | Temp 98.9°F | Resp 14 | Ht 65.0 in | Wt 155.0 lb

## 2018-08-18 DIAGNOSIS — K2900 Acute gastritis without bleeding: Secondary | ICD-10-CM | POA: Diagnosis not present

## 2018-08-18 DIAGNOSIS — K219 Gastro-esophageal reflux disease without esophagitis: Secondary | ICD-10-CM

## 2018-08-18 DIAGNOSIS — K317 Polyp of stomach and duodenum: Secondary | ICD-10-CM | POA: Diagnosis not present

## 2018-08-18 DIAGNOSIS — K2951 Unspecified chronic gastritis with bleeding: Secondary | ICD-10-CM | POA: Diagnosis not present

## 2018-08-18 DIAGNOSIS — K3189 Other diseases of stomach and duodenum: Secondary | ICD-10-CM

## 2018-08-18 DIAGNOSIS — R1310 Dysphagia, unspecified: Secondary | ICD-10-CM | POA: Diagnosis not present

## 2018-08-18 DIAGNOSIS — K209 Esophagitis, unspecified: Secondary | ICD-10-CM | POA: Diagnosis not present

## 2018-08-18 MED ORDER — SODIUM CHLORIDE 0.9 % IV SOLN: 500.0000 mL | Freq: Once | INTRAVENOUS | Status: DC

## 2018-08-18 NOTE — Progress Notes (Signed)
A/ox3, pleased with MAC, report to RN 

## 2018-08-18 NOTE — Progress Notes (Signed)
Called to room to assist during endoscopic procedure.  Patient ID and intended procedure confirmed with present staff. Received instructions for my participation in the procedure from the performing physician.  

## 2018-08-18 NOTE — Op Note (Signed)
[facility] Patient Name: Kristen Schultz Procedure Date: 08/18/2018 9:10 AM MRN: [id number] Endoscopist: Jackquline Denmark , MD Age: 73 Referring MD:  Date of Birth: September 12, 1945 Gender: Female Account #: [id number] Procedure:                Upper GI endoscopy Indications:              Abnormal CT of the GI tract Medicines:                Monitored Anesthesia Care Procedure:                Pre-Anesthesia Assessment:                           - Prior to the procedure, a History and Physical                            was performed, and patient medications and                            allergies were reviewed. The patient's tolerance of                            previous anesthesia was also reviewed. The risks                            and benefits of the procedure and the sedation                            options and risks were discussed with the patient.                            All questions were answered, and informed consent                            was obtained. Prior Anticoagulants: The patient has                            taken no previous anticoagulant or antiplatelet                            agents. ASA Grade Assessment: II - A patient with                            mild systemic disease. After reviewing the risks                            and benefits, the patient was deemed in                            satisfactory condition to undergo the procedure.                           After obtaining informed consent, the endoscope was  passed under direct vision. Throughout the                            procedure, the patient's blood pressure, pulse, and                            oxygen saturations were monitored continuously. The                            Endoscope was introduced through the and advanced                            to the. The Endoscope was introduced through the                            and advanced to the second part  of duodenum. The                            upper GI endoscopy was accomplished without                            difficulty. The patient tolerated the procedure                            well. Scope In: Scope Out: Findings:                 The examined esophagus was normal. Z line was                            well-defined at 40 cm. Minimal transient hiatal                            hernia was noted.                           4 moderate to large polyps were noted with                            underlying atrophic gastritis. (2 semi-pedunculated                            polyps with ulcerations measuring 15 mm in the body                            of the stomach, 1 ulcerated 2.5 cm polyp in the                            antrum and 1 cm polyp at the fundus best visualized                            on the retroflexed examination.) Multiple biopsies  were obtained and sent for histology. Two 4-6 mm                            sessile polyps were also noted in the fundus.                           Biopsies were obtained from atrophic gastritis                            (fundus, body and the antrum) and sent for                            histology in a separate container.                           The examined duodenum was normal. Biopsies were                            taken with a cold forceps for histology. Complications:            No immediate complications. Estimated Blood Loss:     Estimated blood loss was minimal. Impression:               -Significant gastric polyps. Biopsied.                           -Atrophic gastritis. Biopsied. Recommendation:           - Resume previous diet.                           - Continue present medications.                           - Await pathology results.                           -Thereafter, would recommend EUS followed by                            polypectomy at [facility]. We will arrange after the biopsy                             results.                           - Return to GI clinic for televist in 2 weeks. Jackquline Denmark, MD 08/18/2018 11:10:28 AM This report has been signed electronically.

## 2018-08-18 NOTE — Patient Instructions (Signed)
Handout for gastritis given.  YOU HAD AN ENDOSCOPIC PROCEDURE TODAY AT [facility]:   Refer to the procedure report that was given to you for any specific questions about what was found during the examination.  If the procedure report does not answer your questions, please call your gastroenterologist to clarify.  If you requested that your care partner not be given the details of your procedure findings, then the procedure report has been included in a sealed envelope for you to review at your convenience later.  YOU SHOULD EXPECT: Some feelings of bloating in the abdomen. Passage of more gas than usual.  Walking can help get rid of the air that was put into your GI tract during the procedure and reduce the bloating. If you had a lower endoscopy (such as a colonoscopy or flexible sigmoidoscopy) you may notice spotting of blood in your stool or on the toilet paper. If you underwent a bowel prep for your procedure, you may not have a normal bowel movement for a few days.  Please Note:  You might notice some irritation and congestion in your nose or some drainage.  This is from the oxygen used during your procedure.  There is no need for concern and it should clear up in a day or so.  SYMPTOMS TO REPORT IMMEDIATELY:    Following upper endoscopy (EGD)  Vomiting of blood or coffee ground material  New chest pain or pain under the shoulder blades  Painful or persistently difficult swallowing  New shortness of breath  Fever of 100F or higher  Black, tarry-looking stools  For urgent or emergent issues, a gastroenterologist can be reached at any hour by calling [phone].   DIET:  We do recommend a small meal at first, but then you may proceed to your regular diet.  Drink plenty of fluids but you should avoid alcoholic beverages for 24 hours.  ACTIVITY:  You should plan to take it easy for the rest of today and you should NOT DRIVE or use heavy machinery until tomorrow  (because of the sedation medicines used during the test).    FOLLOW UP: Our staff will call the number listed on your records 48-72 hours following your procedure to check on you and address any questions or concerns that you may have regarding the information given to you following your procedure. If we do not reach you, we will leave a message.  We will attempt to reach you two times.  During this call, we will ask if you have developed any symptoms of COVID 19. If you develop any symptoms (ie: fever, flu-like symptoms, shortness of breath, cough etc.) before then, please call [phone].  If you test positive for Covid 19 in the 2 weeks post procedure, please call and report this information to [place].    If any biopsies were taken you will be contacted by phone or by letter within the next 1-3 weeks.  Please call us at [phone] if you have not heard about the biopsies in 3 weeks.    SIGNATURES/CONFIDENTIALITY: You and/or your care partner have signed paperwork which will be entered into your electronic medical record.  These signatures attest to the fact that that the information above on your After Visit Summary has been reviewed and is understood.  Full responsibility of the confidentiality of this discharge information lies with you and/or your care-partner.

## 2018-08-22 ENCOUNTER — Telehealth: Payer: Self-pay

## 2018-08-22 NOTE — Telephone Encounter (Signed)
  Follow up Call-  Call back number 08/18/2018 02/10/2016  Post procedure Call Back phone  # [phone]  Permission to leave phone message Yes Yes  Some recent data might be hidden     Patient questions:  Do you have a fever, pain , or abdominal swelling? No. Pain Score  0 *  Have you tolerated food without any problems? Yes.    Have you been able to return to your normal activities? Yes.    Do you have any questions about your discharge instructions: Diet   No. Medications  No. Follow up visit  No.  Do you have questions or concerns about your Care? No.  Actions: * If pain score is 4 or above: No action needed, pain <4. 1. Have you developed a fever since your procedure? no  2.   Have you had an respiratory symptoms (SOB or cough) since your procedure? no  3.   Have you tested positive for COVID 19 since your procedure no  4.   Have you had any family members/close contacts diagnosed with the COVID 19 since your procedure?  no   If yes to any of these questions please route to Joylene John, RN and Alphonsa Gin, Therapist, sports.

## 2018-09-05 ENCOUNTER — Telehealth (INDEPENDENT_AMBULATORY_CARE_PROVIDER_SITE_OTHER): Payer: Medicare HMO | Admitting: Gastroenterology

## 2018-09-05 ENCOUNTER — Other Ambulatory Visit: Payer: Self-pay

## 2018-09-05 ENCOUNTER — Encounter: Payer: Self-pay | Admitting: Gastroenterology

## 2018-09-05 VITALS — Ht 65.0 in | Wt 153.0 lb

## 2018-09-05 DIAGNOSIS — R748 Abnormal levels of other serum enzymes: Secondary | ICD-10-CM

## 2018-09-05 DIAGNOSIS — K317 Polyp of stomach and duodenum: Secondary | ICD-10-CM

## 2018-09-05 NOTE — Progress Notes (Signed)
Chief Complaint:   Referring Provider:  Carollee Herter, Alferd Apa, *      ASSESSMENT AND PLAN;   #1. Gastric polyps (Bx HGD) with atrophic gastritis/gastric metaplasia. CT abdo/pelvis 03/2018 with gastric wall thickening.   #2. GERD with small transient HH.  #3. 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 Nl Lipase. Neg CT, [place] 03/2018 for pancreatic abn. Neg salivary amylase. No abdominal pain. Off HCTZ.   #4. ?Sjogren's with positive RA/ANA (being evaluated by Rheumatology, awaiting cheek Bx). No s/s s/o scleroderma.  Plan: - Continue Pepcid 20mg  po qd.  - EUS with gastric EMR at [facility] with Dr Delrae Alfred. He is aware. - Check fasting gastrin, antiparietal cell antibody, anti-intrinsic factor antibody and B12.    HPI:    Kristen Schultz is a 73 y.o. female  Doing well [place] got [place].  No GI complaints.  No abdominal pain.  Has been eating well. No nausea, vomiting, heartburn, regurgitation, odynophagia or dysphagia.  No significant diarrhea or constipation.  There is no melena or hematochezia. No unintentional weight loss.  Past GI procedures: -Colonoscopy 02/10/2016-4 mm polyp status post polypectomy, internal hemorrhoids, left colonic diverticulosis. Bx- TA. -EGD 08/18/2018 Gastric polyps, small HH, atrophic gastritis. Bx- HGD polyps (mainly antral but sent in same bottle), gastric metaplasia, neg SB Bx for celiac. -CT abdo/pel 04/10/2018   -Sigmoid diverticulosis with muscular hypertrophy.  -Small hiatal hernia  -Mild gastric wall thickening.  -Liver scar due to previous cyst removal.  No cirrhosis on review.  -Aortic atherosclerosis.  -DJD   Past Medical History:  Diagnosis Date  . Anxiety   . Colon polyps   . Gastric ulcer    per patient; in the 1970s  . Heart murmur   . Hyperlipidemia    no treatment per pt  . Hypertension   . Liver cyst   . Ovarian cyst 1984  . Pneumonia     Past Surgical  History:  Procedure Laterality Date  . COLONOSCOPY  02/10/2016  . DENTAL SURGERY  2000   dental implant  . HEMORRHOID SURGERY    . LAPAROSCOPIC LIVER CYST REMOVAL    . OVARIAN CYST SURGERY      Family History  Problem Relation Age of Onset  . Heart disease Father   . Heart disease Brother 46  . Heart disease Brother   . Dementia Sister   . Glaucoma Neg Hx   . Colon cancer Neg Hx   . Esophageal cancer Neg Hx   . Pancreatic cancer Neg Hx   . Stomach cancer Neg Hx   . Rectal cancer Neg Hx   . Colon polyps Neg Hx     Social History   Tobacco Use  . Smoking status: Former Smoker    Packs/day: 0.30    Years: 15.00    Pack years: 4.50    Quit date: 11/17/1985    Years since quitting: 32.8  . Smokeless tobacco: Never Used  Substance Use Topics  . Alcohol use: Yes    Alcohol/week: 14.0 standard drinks    Types: 14 Glasses of wine per week    Comment: 2 glasses per night  . Drug use: No    Current Outpatient Medications  Medication Sig Dispense Refill  . aspirin 81 MG tablet Take 81 mg by mouth daily. As needed    . citalopram (CELEXA) 10 MG tablet TAKE 1 TABLET BY MOUTH EVERY DAY 90 tablet 1  .  famotidine (PEPCID) 20 MG tablet Take 20 mg by mouth daily.    Marland Kitchen losartan (COZAAR) 50 MG tablet Take 50 mg by mouth daily.    . Multiple Vitamin (MULTIVITAMIN) capsule Take 1 capsule by mouth daily.    . Probiotic Product (PROBIOTIC DAILY PO) Take by mouth.     No current facility-administered medications for this visit.     Allergies  Allergen Reactions  . Bee Venom Anaphylaxis    Bee stings  . Codeine     Extreme Vomiting  . Other Other (See Comments)    CDN - reaction unknown  . Penicillins     Redness, swelling and itching Can take Keflex  . Tetracyclines & Related     Yeast Infection and Stomach problems  . Xylocaine Dental [Lidocaine-Epinephrine]     Review of Systems:  neg     Physical Exam:    Ht 5\' 5"  (1.651 m)   Wt 153 lb (69.4 kg)   BMI 25.46  kg/m  Filed Weights   09/05/18 0810  Weight: 153 lb (69.4 kg)   Not examined since it was a tele-visit  CBC: CBC Latest Ref Rng & Units 06/29/2018 05/18/2018 04/03/2018  WBC 3.4 - 10.8 x10E3/uL 7.0 5.9 6.6  Hemoglobin 11.1 - 15.9 g/dL 13.6 15.9(H) 14.7  Hematocrit 34.0 - 46.6 % 41.5 46.8(H) 43.6  Platelets 150 - 450 x10E3/uL 286 304.0 282.0    CMP: CMP Latest Ref Rng & Units 06/29/2018 05/18/2018 05/16/2018  Glucose 65 - 99 mg/dL 111(H) 86 77  BUN 8 - 27 mg/dL 13 14 16   Creatinine 0.57 - 1.00 mg/dL 0.72 0.80 0.67  Sodium 134 - 144 mmol/L 140 142 139  Potassium 3.5 - 5.2 mmol/L 5.3(H) 5.1 4.8  Chloride 96 - 106 mmol/L 101 105 104  CO2 20 - 29 mmol/L 22 29 28   Calcium 8.7 - 10.3 mg/dL 9.4 9.8 9.5  Total Protein 6.0 - 8.5 g/dL 6.4 6.8 6.6  Total Bilirubin 0.0 - 1.2 mg/dL 0.4 0.4 0.5  Alkaline Phos 39 - 117 IU/L 54 54 53  AST 0 - 40 IU/L 21 19 20   ALT 0 - 32 IU/L 12 16 15    Amylase    Component Value Date/Time   AMYLASE 549 (HH) 06/29/2018 1320   Lipase     Component Value Date/Time   LIPASE 29 06/29/2018 1320     This service was provided via telemedicine.  The patient was located at home.  The provider was located in office.  The patient did consent to this telephone visit and is aware of possible charges through their insurance for this visit.   Time spent on call and coordinating care: 25 min     Carmell Austria, MD 09/05/2018, 8:45 AM  Cc: Carollee Herter, Alferd Apa, *  Dr Delrae Alfred at [facility]

## 2018-09-05 NOTE — Patient Instructions (Addendum)
If you are age 73 or older, your body mass index should be between 23-30. Your Body mass index is 25.46 kg/m. If this is out of the aforementioned range listed, please consider follow up with your Primary Care Provider.  If you are age 67 or younger, your body mass index should be between 19-25. Your Body mass index is 25.46 kg/m. If this is out of the aformentioned range listed, please consider follow up with your Primary Care Provider.    Your provider has requested that you go to the basement level for lab work at [facility]. Press "B" on the elevator. The lab is located at the first door on the left as you exit the elevator.  Continue Pepcid.   A referral has been sent to Dr. Delrae Alfred for an EUS. You will be contacted by there office with this appointment.  Thank you,  Dr. Jackquline Denmark

## 2018-09-06 ENCOUNTER — Other Ambulatory Visit (INDEPENDENT_AMBULATORY_CARE_PROVIDER_SITE_OTHER): Payer: Medicare HMO

## 2018-09-06 DIAGNOSIS — K317 Polyp of stomach and duodenum: Secondary | ICD-10-CM

## 2018-09-06 DIAGNOSIS — R748 Abnormal levels of other serum enzymes: Secondary | ICD-10-CM | POA: Diagnosis not present

## 2018-09-06 LAB — VITAMIN B12: Vitamin B-12: 171 pg/mL — ABNORMAL LOW (ref 211–911)

## 2018-09-08 ENCOUNTER — Ambulatory Visit: Payer: Medicare HMO | Admitting: Gastroenterology

## 2018-09-10 LAB — INTRINSIC FACTOR ANTIBODIES: Intrinsic Factor: UNDETERMINED — AB

## 2018-09-10 LAB — ANTI-PARIETAL ANTIBODY: PARIETAL CELL AB SCREEN: NEGATIVE

## 2018-09-10 LAB — GASTRIN: Gastrin: 701 pg/mL — ABNORMAL HIGH (ref <=100)

## 2018-09-13 ENCOUNTER — Other Ambulatory Visit: Payer: Self-pay

## 2018-09-13 DIAGNOSIS — K317 Polyp of stomach and duodenum: Secondary | ICD-10-CM

## 2018-09-14 ENCOUNTER — Telehealth: Payer: Self-pay

## 2018-09-14 DIAGNOSIS — E538 Deficiency of other specified B group vitamins: Secondary | ICD-10-CM

## 2018-09-14 NOTE — Telephone Encounter (Signed)
Please advise 

## 2018-09-14 NOTE — Telephone Encounter (Signed)
Ok to start b12 injections ---- weekly x4 then monthly  Recheck b12 in 1 month

## 2018-09-14 NOTE — Telephone Encounter (Signed)
Copied from [place] [id number]. Topic: Appointment Scheduling - Scheduling Inquiry for Clinic >> Sep 13, 2018  4:54 PM Rainey Pines A wrote: Patient would like a callback in regards to getting B12 shot that was recommended by Dr. Lyndel Safe

## 2018-09-15 ENCOUNTER — Ambulatory Visit (INDEPENDENT_AMBULATORY_CARE_PROVIDER_SITE_OTHER): Payer: Medicare HMO

## 2018-09-15 ENCOUNTER — Other Ambulatory Visit: Payer: Self-pay

## 2018-09-15 DIAGNOSIS — E538 Deficiency of other specified B group vitamins: Secondary | ICD-10-CM | POA: Diagnosis not present

## 2018-09-15 MED ORDER — CYANOCOBALAMIN 1000 MCG/ML IJ SOLN
1000.0000 ug | Freq: Once | INTRAMUSCULAR | Status: AC
  Administered 2018-09-15: 1000 ug via INTRAMUSCULAR

## 2018-09-15 NOTE — Telephone Encounter (Signed)
Spoke w/ Pt- informed of recommendations. Nurse visit scheduled for today at 10:30 for first B12 injection- no covid symptoms or GI symptoms. B12 ordered to recheck in 1 month.

## 2018-09-15 NOTE — Progress Notes (Addendum)
Pt here for monthly B12 injection per Dr. Etter Sjogren  B12 1070mcg given IM, and pt tolerated injection well.  Next B12 injection scheduled for next week   [name], DO

## 2018-09-19 ENCOUNTER — Ambulatory Visit: Payer: Medicare HMO

## 2018-09-21 ENCOUNTER — Ambulatory Visit (INDEPENDENT_AMBULATORY_CARE_PROVIDER_SITE_OTHER): Payer: Medicare HMO

## 2018-09-21 ENCOUNTER — Ambulatory Visit: Payer: Medicare HMO

## 2018-09-21 ENCOUNTER — Other Ambulatory Visit: Payer: Self-pay

## 2018-09-21 DIAGNOSIS — E538 Deficiency of other specified B group vitamins: Secondary | ICD-10-CM | POA: Diagnosis not present

## 2018-09-21 MED ORDER — CYANOCOBALAMIN 1000 MCG/ML IJ SOLN
1000.0000 ug | Freq: Once | INTRAMUSCULAR | Status: AC
  Administered 2018-09-21: 1000 ug via INTRAMUSCULAR

## 2018-09-21 NOTE — Progress Notes (Addendum)
Pt here for weekly B12 injection per Dr. Etter Sjogren  B12 1045mcg given IM left deltoid, and pt tolerated injection well.  Next B12 injection scheduled for next week   [name], DO

## 2018-09-26 ENCOUNTER — Other Ambulatory Visit: Payer: Self-pay | Admitting: Family Medicine

## 2018-09-26 DIAGNOSIS — R110 Nausea: Secondary | ICD-10-CM

## 2018-09-26 DIAGNOSIS — R1013 Epigastric pain: Secondary | ICD-10-CM

## 2018-09-27 ENCOUNTER — Telehealth: Payer: Self-pay | Admitting: Gastroenterology

## 2018-09-27 NOTE — Telephone Encounter (Signed)
Called and spoke with patient- patient was needing clarification of procedure she was referred to [facility] for upper endoscopic ultrasound (EUS) with Dr. Delrae Alfred.  Patient verbalized understanding of information and was also informed to call back should questions/concerns arise;

## 2018-09-27 NOTE — Telephone Encounter (Signed)
Pt stated that Dr. Lyndel Safe had referred her to [facility] to remove polyps, but she is scheduled there for [place] and EGD. Pt is confused and would like a call back.

## 2018-09-28 ENCOUNTER — Ambulatory Visit (INDEPENDENT_AMBULATORY_CARE_PROVIDER_SITE_OTHER): Payer: Medicare HMO

## 2018-09-28 ENCOUNTER — Other Ambulatory Visit: Payer: Self-pay

## 2018-09-28 DIAGNOSIS — E538 Deficiency of other specified B group vitamins: Secondary | ICD-10-CM

## 2018-09-28 MED ORDER — CYANOCOBALAMIN 1000 MCG/ML IJ SOLN
1000.0000 ug | Freq: Once | INTRAMUSCULAR | Status: AC
  Administered 2018-09-28: 1000 ug via INTRAMUSCULAR

## 2018-09-28 NOTE — Progress Notes (Addendum)
Patient here for weekly B12 injection per Dr. Carollee Herter.  Injection given on right deltoid with out any complications.  [name], DO

## 2018-10-03 ENCOUNTER — Other Ambulatory Visit: Payer: Self-pay | Admitting: Family Medicine

## 2018-10-03 DIAGNOSIS — R682 Dry mouth, unspecified: Secondary | ICD-10-CM | POA: Diagnosis not present

## 2018-10-05 ENCOUNTER — Other Ambulatory Visit: Payer: Self-pay

## 2018-10-05 ENCOUNTER — Ambulatory Visit (INDEPENDENT_AMBULATORY_CARE_PROVIDER_SITE_OTHER): Payer: Medicare HMO

## 2018-10-05 DIAGNOSIS — E538 Deficiency of other specified B group vitamins: Secondary | ICD-10-CM | POA: Diagnosis not present

## 2018-10-05 MED ORDER — CYANOCOBALAMIN 1000 MCG/ML IJ SOLN
1000.0000 ug | Freq: Once | INTRAMUSCULAR | Status: AC
  Administered 2018-10-05: 1000 ug via INTRAMUSCULAR

## 2018-10-05 NOTE — Progress Notes (Addendum)
Pt here today for B12 injection. Cyanocobalamin 63mL injected into L deltoid. Pt tolerated injection well.   Next in 4 weeks.    Reviewed  [name], DO

## 2018-10-11 DIAGNOSIS — R69 Illness, unspecified: Secondary | ICD-10-CM | POA: Diagnosis not present

## 2018-10-30 DIAGNOSIS — K317 Polyp of stomach and duodenum: Secondary | ICD-10-CM | POA: Diagnosis not present

## 2018-10-30 DIAGNOSIS — R7989 Other specified abnormal findings of blood chemistry: Secondary | ICD-10-CM | POA: Diagnosis not present

## 2018-10-30 DIAGNOSIS — Z01812 Encounter for preprocedural laboratory examination: Secondary | ICD-10-CM | POA: Diagnosis not present

## 2018-10-30 DIAGNOSIS — Z1159 Encounter for screening for other viral diseases: Secondary | ICD-10-CM | POA: Diagnosis not present

## 2018-10-30 DIAGNOSIS — Z20828 Contact with and (suspected) exposure to other viral communicable diseases: Secondary | ICD-10-CM | POA: Diagnosis not present

## 2018-11-03 ENCOUNTER — Ambulatory Visit: Payer: Medicare HMO

## 2018-11-06 DIAGNOSIS — K317 Polyp of stomach and duodenum: Secondary | ICD-10-CM | POA: Diagnosis not present

## 2018-11-06 DIAGNOSIS — K259 Gastric ulcer, unspecified as acute or chronic, without hemorrhage or perforation: Secondary | ICD-10-CM | POA: Diagnosis not present

## 2018-11-06 DIAGNOSIS — K3189 Other diseases of stomach and duodenum: Secondary | ICD-10-CM | POA: Diagnosis not present

## 2018-11-06 HISTORY — PX: ESOPHAGOGASTRODUODENOSCOPY: SHX1529

## 2018-11-14 ENCOUNTER — Ambulatory Visit (INDEPENDENT_AMBULATORY_CARE_PROVIDER_SITE_OTHER): Payer: Medicare HMO | Admitting: *Deleted

## 2018-11-14 ENCOUNTER — Other Ambulatory Visit: Payer: Self-pay

## 2018-11-14 DIAGNOSIS — E538 Deficiency of other specified B group vitamins: Secondary | ICD-10-CM

## 2018-11-14 MED ORDER — CYANOCOBALAMIN 1000 MCG/ML IJ SOLN
1000.0000 ug | Freq: Once | INTRAMUSCULAR | Status: AC
  Administered 2018-11-14: 09:00:00 1000 ug via INTRAMUSCULAR

## 2018-11-14 NOTE — Progress Notes (Signed)
Pt here today for B12 injection.   Injection given and patient tolerated injection well.   Patient will discuss rechecking b12 at next visit with Dr. Etter Sjogren in September.Kristen Schultz

## 2018-11-28 ENCOUNTER — Encounter: Payer: Self-pay | Admitting: Family Medicine

## 2018-11-28 ENCOUNTER — Ambulatory Visit (INDEPENDENT_AMBULATORY_CARE_PROVIDER_SITE_OTHER): Payer: Medicare HMO | Admitting: Family Medicine

## 2018-11-28 ENCOUNTER — Other Ambulatory Visit: Payer: Self-pay

## 2018-11-28 VITALS — BP 120/70 | HR 65 | Temp 97.5°F | Resp 18 | Ht 65.0 in | Wt 156.6 lb

## 2018-11-28 DIAGNOSIS — R748 Abnormal levels of other serum enzymes: Secondary | ICD-10-CM | POA: Diagnosis not present

## 2018-11-28 DIAGNOSIS — E538 Deficiency of other specified B group vitamins: Secondary | ICD-10-CM

## 2018-11-28 DIAGNOSIS — I10 Essential (primary) hypertension: Secondary | ICD-10-CM | POA: Diagnosis not present

## 2018-11-28 DIAGNOSIS — F4323 Adjustment disorder with mixed anxiety and depressed mood: Secondary | ICD-10-CM | POA: Diagnosis not present

## 2018-11-28 DIAGNOSIS — R69 Illness, unspecified: Secondary | ICD-10-CM | POA: Diagnosis not present

## 2018-11-28 DIAGNOSIS — Z23 Encounter for immunization: Secondary | ICD-10-CM | POA: Diagnosis not present

## 2018-11-28 DIAGNOSIS — F411 Generalized anxiety disorder: Secondary | ICD-10-CM | POA: Diagnosis not present

## 2018-11-28 DIAGNOSIS — K219 Gastro-esophageal reflux disease without esophagitis: Secondary | ICD-10-CM | POA: Diagnosis not present

## 2018-11-28 LAB — COMPREHENSIVE METABOLIC PANEL
ALT: 12 U/L (ref 0–35)
AST: 18 U/L (ref 0–37)
Albumin: 4.1 g/dL (ref 3.5–5.2)
Alkaline Phosphatase: 54 U/L (ref 39–117)
BUN: 12 mg/dL (ref 6–23)
CO2: 29 mEq/L (ref 19–32)
Calcium: 9.3 mg/dL (ref 8.4–10.5)
Chloride: 102 mEq/L (ref 96–112)
Creatinine, Ser: 0.64 mg/dL (ref 0.40–1.20)
GFR: 91.03 mL/min (ref >60.00)
Glucose, Bld: 86 mg/dL (ref 70–99)
Potassium: 5 mEq/L (ref 3.5–5.1)
Sodium: 139 mEq/L (ref 135–145)
Total Bilirubin: 0.8 mg/dL (ref 0.2–1.2)
Total Protein: 6.7 g/dL (ref 6.0–8.3)

## 2018-11-28 LAB — LIPID PANEL
Cholesterol: 223 mg/dL — ABNORMAL HIGH (ref 0–200)
HDL: 85.8 mg/dL (ref >39.00)
LDL Cholesterol: 126 mg/dL — ABNORMAL HIGH (ref 0–99)
NonHDL: 136.72
Total CHOL/HDL Ratio: 3
Triglycerides: 54 mg/dL (ref 0.0–149.0)
VLDL: 10.8 mg/dL (ref 0.0–40.0)

## 2018-11-28 LAB — AMYLASE: Amylase: 415 U/L — ABNORMAL HIGH (ref 27–131)

## 2018-11-28 LAB — VITAMIN B12: Vitamin B-12: 319 pg/mL (ref 211–911)

## 2018-11-28 MED ORDER — CITALOPRAM HYDROBROMIDE 10 MG PO TABS: ORAL_TABLET | ORAL | 1 refills | Status: DC

## 2018-11-28 NOTE — Assessment & Plan Note (Signed)
Recheck today F/u GI

## 2018-11-28 NOTE — Progress Notes (Signed)
Patient ID: Kristen Schultz, female    DOB: Jan 11, 1946  Age: 73 y.o. MRN: [id number]    Subjective:  Subjective  HPI Kristen Schultz presents for f/u bp and anxiety.  She has also has seen Dr Lyndel Safe and [facility] and had polyps removed and they were benign  Review of Systems  Constitutional: Negative for appetite change, diaphoresis, fatigue and unexpected weight change.  Eyes: Negative for pain, redness and visual disturbance.  Respiratory: Negative for cough, chest tightness, shortness of breath and wheezing.   Cardiovascular: Negative for chest pain, palpitations and leg swelling.  Endocrine: Negative for cold intolerance, heat intolerance, polydipsia, polyphagia and polyuria.  Genitourinary: Negative for difficulty urinating, dysuria and frequency.  Neurological: Negative for dizziness, light-headedness, numbness and headaches.    History Past Medical History:  Diagnosis Date  . Anxiety   . Colon polyps   . Gastric ulcer    per patient; in the 1970s  . Heart murmur   . Hyperlipidemia    no treatment per pt  . Hypertension   . Liver cyst   . Ovarian cyst 1984  . Pneumonia     She has a past surgical history that includes Hemorrhoid surgery; Laparoscopic liver cyst removal; Ovarian cyst surgery; Dental surgery (2000); and Colonoscopy (02/10/2016).   Her family history includes Dementia in her sister; Heart disease in her brother and father; Heart disease (age of onset: 39) in her brother.She reports that she quit smoking about 33 years ago. She has a 4.50 pack-year smoking history. She has never used smokeless tobacco. She reports current alcohol use of about 14.0 standard drinks of alcohol per week. She reports that she does not use drugs.  Current Outpatient Medications on File Prior to Visit  Medication Sig Dispense Refill  . losartan (COZAAR) 25 MG tablet TAKE 2 TABLET BY MOUTH EVERY DAY 180 tablet 1  . Multiple Vitamin (MULTIVITAMIN) capsule Take 1 capsule by mouth daily.      No current facility-administered medications on file prior to visit.      Objective:  Objective  Physical Exam Vitals signs and nursing note reviewed.  Constitutional:      Appearance: She is well-developed.  HENT:     Head: Normocephalic and atraumatic.  Eyes:     Conjunctiva/sclera: Conjunctivae normal.  Neck:     Musculoskeletal: Normal range of motion and neck supple.     Thyroid: No thyromegaly.     Vascular: No carotid bruit or JVD.  Cardiovascular:     Rate and Rhythm: Normal rate and regular rhythm.     Heart sounds: Normal heart sounds. No murmur.  Pulmonary:     Effort: Pulmonary effort is normal. No respiratory distress.     Breath sounds: Normal breath sounds. No wheezing or rales.  Chest:     Chest wall: No tenderness.  Neurological:     Mental Status: She is alert and oriented to person, place, and time.    BP 120/70 (BP Location: Right Arm, Patient Position: Sitting, Cuff Size: Normal)   Pulse 65   Temp (!) 97.5 F (36.4 C) (Temporal)   Resp 18   Ht 5\' 5"  (1.651 m)   Wt 156 lb 9.6 oz (71 kg)   SpO2 99%   BMI 26.06 kg/m  Wt Readings from Last 3 Encounters:  11/28/18 156 lb 9.6 oz (71 kg)  09/05/18 153 lb (69.4 kg)  08/18/18 155 lb (70.3 kg)     Lab Results  Component Value Date  WBC 7.0 06/29/2018   HGB 13.6 06/29/2018   HCT 41.5 06/29/2018   PLT 286 06/29/2018   GLUCOSE 111 (H) 06/29/2018   CHOL 210 (H) 05/18/2018   TRIG 75.0 05/18/2018   HDL 61.10 05/18/2018   LDLCALC 134 (H) 05/18/2018   ALT 12 06/29/2018   AST 21 06/29/2018   NA 140 06/29/2018   K 5.3 (H) 06/29/2018   CL 101 06/29/2018   CREATININE 0.72 06/29/2018   BUN 13 06/29/2018   CO2 22 06/29/2018   TSH 1.56 01/07/2017   MICROALBUR <0.7 05/01/2018    Ct Abdomen Pelvis W Contrast  Result Date: 04/10/2018 CLINICAL DATA:  73 year old female with abdominal pain for the past 2 months. Prior ovarian cyst surgery. Liver cyst post surgery. Eight ulcer tray. Gastric ulcer,  hypertension hyperlipidemia. Initial encounter. EXAM: CT ABDOMEN AND PELVIS WITH CONTRAST TECHNIQUE: Multidetector CT imaging of the abdomen and pelvis was performed using the standard protocol following bolus administration of intravenous contrast. CONTRAST:  182mL ISOVUE-300 IOPAMIDOL (ISOVUE-300) INJECTION 61% COMPARISON:  04/03/2018 ultrasound. FINDINGS: Lower chest: Scarring/atelectasis lung bases. Base of heart unremarkable. Hepatobiliary: Lobulated contour of the liver. It is possible this is related to prior surgery for cyst removal but cannot exclude cirrhosis. Hypodensity noted falciform ligament may reflect changes of fatty infiltration. No worrisome focal hepatic lesion. No calcified gallstone or common bile duct stone. No CT evidence of gallbladder inflammation. Pancreas: No worrisome pancreatic mass or inflammation. Spleen: No splenic mass or enlargement. Adrenals/Urinary Tract: No obstructing stone or hydronephrosis. Right renal cyst. No worrisome renal or adrenal lesion. Noncontrast filled views of the urinary bladder unremarkable. Stomach/Bowel: Sigmoid diverticulosis with significant associated muscular hypertrophy. This limits evaluation for detection of a mass. No extraluminal inflammation. No inflammation surrounds the appendix or terminal ileum. Small hiatal hernia. Thickening of the mucosa at this level may be related to under distension or inflammation. Mild thickening gastric antrum may be related to under distension. Limited for evaluating for inflammation or mass at this level. Vascular/Lymphatic: Atherosclerotic changes aorta and aortic branch vessels. No abdominal aortic aneurysm or large vessel occlusion. Scattered normal size lymph nodes. Reproductive: Small calcified uterine fibroid. No worrisome adnexal mass. Other: No free air or bowel containing hernia. Fatty replaced right rectus muscle. Musculoskeletal: Remote moderate-size Schmorl's node deformity/superior endplate compression  deformity T12 and L1 with 30% loss of height T12 and up to 50% loss of height L1. No retropulsion. Facet degenerative changes L4-5 with mild anterior slip L4 and bulge. Multifactorial prominent L4-5 spinal stenosis and foraminal narrowing. Bony overgrowth ischium bilaterally. IMPRESSION: 1. Sigmoid diverticulosis with significant associated muscular hypertrophy. This limits evaluation for detection of a mass. No extraluminal inflammation. 2. Small hiatal hernia. Thickening of the mucosa at this level may be related to under distension or inflammation. 3. Mild thickening gastric antrum may be related to under distension. Limited for evaluating for inflammation or mass at this level. 4. Lobulated contour of the liver. It is possible this is related to prior surgery for cyst removal but cannot exclude cirrhosis. 5. Aortic Atherosclerosis (ICD10-I70.0). Atherosclerotic changes aorta and aortic branch vessels. No abdominal aortic aneurysm or large vessel occlusion. 6. Remote moderate-size Schmorl's node deformity/superior endplate compression deformity T12 and L1 with 30% loss of height T12 and up to 50% loss of height L1. No retropulsion. 7. Facet degenerative changes L4-5 with mild anterior slip L4 and bulge. Multifactorial prominent L4-5 spinal stenosis and foraminal narrowing. Electronically Signed   By: Genia Del M.D.   On: 04/10/2018  12:16     Assessment & Plan:  Plan  I have discontinued Connye Burkitt. Heimann's aspirin, Probiotic Product (PROBIOTIC DAILY PO), and famotidine. I am also having her maintain her multivitamin, losartan, and citalopram.  Meds ordered this encounter  Medications  . citalopram (CELEXA) 10 MG tablet    Sig: TAKE 1 TABLET BY MOUTH EVERY DAY    Dispense:  90 tablet    Refill:  1    Problem List Items Addressed This Visit      Unprioritized   Elevated amylase    Recheck today F/u GI      Relevant Orders   Comprehensive metabolic panel   Amylase   Essential hypertension     Well controlled, no changes to meds. Encouraged heart healthy diet such as the DASH diet and exercise as tolerated.       Relevant Orders   Comprehensive metabolic panel   Lipid panel   Gastroesophageal reflux disease    Per GI      Generalized anxiety disorder    Stable con't meds      Relevant Medications   citalopram (CELEXA) 10 MG tablet    Other Visit Diagnoses    Need for influenza vaccination    -  Primary   Relevant Orders   Flu Vaccine QUAD High Dose(Fluad) (Completed)   Adjustment disorder with mixed anxiety and depressed mood       Relevant Medications   citalopram (CELEXA) 10 MG tablet   B12 deficiency       Relevant Orders   Vitamin B12      Follow-up: Return in about 6 months (around 05/28/2019), or if symptoms worsen or fail to improve, for annual exam, fasting.  [name], DO

## 2018-11-28 NOTE — Assessment & Plan Note (Signed)
Well controlled, no changes to meds. Encouraged heart healthy diet such as the DASH diet and exercise as tolerated.  °

## 2018-11-28 NOTE — Assessment & Plan Note (Signed)
Stable con't meds 

## 2018-11-28 NOTE — Assessment & Plan Note (Signed)
Per GI

## 2018-12-01 NOTE — Progress Notes (Signed)
Virtual Visit via Video Note  I connected with patient on 12/04/18 at 10:00 AM EDT by audio enabled telemedicine application and verified that I am speaking with the correct person using two identifiers.   THIS ENCOUNTER IS A VIRTUAL VISIT DUE TO COVID-19 - PATIENT WAS NOT SEEN IN THE OFFICE. PATIENT HAS CONSENTED TO VIRTUAL VISIT / TELEMEDICINE VISIT   Location of patient: home  Location of provider: office  I discussed the limitations of evaluation and management by telemedicine and the availability of in person appointments. The patient expressed understanding and agreed to proceed.   Subjective:   Kristen Schultz is a 73 y.o. female who presents for Medicare Annual (Subsequent) preventive examination.  Review of Systems:    Home Safety/Smoke Alarms: Feels safe in home. Smoke alarms in place.  Lives with husband and dog in 1 story home.  Female:         Mammo-  01/09/18   Dexa scan-  01/09/18      CCS- next due 01/2021     Objective:     Vitals: Unable to assess. This visit is enabled though telemedicine due to Covid 19.   Advanced Directives 12/04/2018 11/29/2017 11/18/2015 05/19/2015  Does Patient Have a Medical Advance Directive? Yes Yes Yes Yes  Type of Paramedic of [place];Living will [place];Living will [place];Living will [place];Living will  Does patient want to make changes to medical advance directive? No - Patient declined - No - Patient declined No - Patient declined  Copy of [place] in Chart? Yes - validated most recent copy scanned in chart (See row information) Yes No - copy requested No - copy requested    Tobacco Social History   Tobacco Use  Smoking Status Former Smoker  . Packs/day: 0.30  . Years: 15.00  . Pack years: 4.50  . Quit date: 11/17/1985  . Years since quitting: 33.0  Smokeless Tobacco Never Used     Counseling given: Not Answered    Clinical Intake:     Pain : No/denies pain                 Past Medical History:  Diagnosis Date  . Anxiety   . Colon polyps   . Gastric ulcer    per patient; in the 1970s  . Heart murmur   . Hyperlipidemia    no treatment per pt  . Hypertension   . Liver cyst   . Ovarian cyst 1984  . Pneumonia    Past Surgical History:  Procedure Laterality Date  . COLONOSCOPY  02/10/2016  . DENTAL SURGERY  2000   dental implant  . HEMORRHOID SURGERY    . LAPAROSCOPIC LIVER CYST REMOVAL    . OVARIAN CYST SURGERY     Family History  Problem Relation Age of Onset  . Heart disease Father   . Heart disease Brother 36  . Heart disease Brother   . Dementia Sister   . Glaucoma Neg Hx   . Colon cancer Neg Hx   . Esophageal cancer Neg Hx   . Pancreatic cancer Neg Hx   . Stomach cancer Neg Hx   . Rectal cancer Neg Hx   . Colon polyps Neg Hx    Social History   Socioeconomic History  . Marital status: Married    Spouse name: [name]  . Number of children: 1  . Years of education: Not on file  .  Highest education level: Not on file  Occupational History  . Occupation: retired    Comment: retired  Scientific laboratory technician  . Financial resource strain: Not on file  . Food insecurity    Worry: Not on file    Inability: Not on file  . Transportation needs    Medical: Not on file    Non-medical: Not on file  Tobacco Use  . Smoking status: Former Smoker    Packs/day: 0.30    Years: 15.00    Pack years: 4.50    Quit date: 11/17/1985    Years since quitting: 33.0  . Smokeless tobacco: Never Used  Substance and Sexual Activity  . Alcohol use: Yes    Alcohol/week: 14.0 standard drinks    Types: 14 Glasses of wine per week    Comment: 2 glasses per night  . Drug use: No  . Sexual activity: Not Currently    Partners: Male  Lifestyle  . Physical activity    Days per week: Not on file    Minutes per session: Not on file  . Stress: Not on file  Relationships  . Social  Herbalist on phone: Not on file    Gets together: Not on file    Attends religious service: Not on file    Active member of club or organization: Not on file    Attends meetings of clubs or organizations: Not on file    Relationship status: Not on file  Other Topics Concern  . Not on file  Social History Narrative   Exercise- Walks about 15-22 miles per week with womens group.    Outpatient Encounter Medications as of 12/04/2018  Medication Sig  . citalopram (CELEXA) 10 MG tablet TAKE 1 TABLET BY MOUTH EVERY DAY  . losartan (COZAAR) 25 MG tablet TAKE 2 TABLET BY MOUTH EVERY DAY  . Multiple Vitamin (MULTIVITAMIN) capsule Take 1 capsule by mouth daily.   No facility-administered encounter medications on file as of 12/04/2018.     Activities of Daily Living No flowsheet data found.  Patient Care Team: Carollee Herter, Alferd Apa, DO as PCP - General (Family Medicine) Otelia Sergeant, OD as Referring Physician (Optometry) Barbaraann Barthel, Sharyn Lull, MD as Consulting Physician (Sports Medicine) Roseanne Kaufman, MD as Consulting Physician (Orthopedic Surgery) Jacolyn Reedy, MD as Consulting Physician (Cardiology)    Assessment:   This is a routine wellness examination for Kristen Schultz. Physical assessment deferred to PCP.  Exercise Activities and Dietary recommendations   Diet (meal preparation, eat out, water intake, caffeinated beverages, dairy products, fruits and vegetables): in general, a "healthy" diet  , well balanced   Goals    . DIET - INCREASE WATER INTAKE    . Increase physical activity     Start back to [place]  12/04/2018 11/29/2017 01/07/2017 11/18/2015 11/11/2014  Falls in the past year? 0 No Yes No No  Number falls in past yr: - - 1 - -  Injury with Fall? - - Yes - -   Depression Screen PHQ 2/9 Scores 12/04/2018 11/29/2017 01/07/2017 11/18/2015  PHQ - 2 Score 0 0 0 0     Cognitive Function Ad8 score reviewed for issues:  Issues making  decisions:no  Less interest in hobbies / activities:no  Repeats questions, stories (family complaining):no  Trouble using ordinary gadgets (microwave, computer, phone):no  Forgets the month or year: no  Mismanaging finances: no  Remembering appts:no  Daily  problems with thinking and/or memory:no Ad8 score is=0   MMSE - Mini Mental State Exam 11/18/2015  Orientation to time 5  Orientation to Place 5  Registration 3  Attention/ Calculation 5  Recall 3  Language- name 2 objects 2  Language- repeat 1  Language- follow 3 step command 3  Language- read & follow direction 1  Write a sentence 1  Copy design 1  Total score 30        Immunization History  Administered Date(s) Administered  . Fluad Quad(high Dose 65+) 11/28/2018  . Influenza, High Dose Seasonal PF 02/20/2016, 01/07/2017  . Influenza,inj,Quad PF,6+ Mos 01/17/2015  . Influenza-Unspecified 01/12/2018  . Pneumococcal Conjugate-13 01/26/2013, 01/17/2015  . Pneumococcal Polysaccharide-23 06/23/2011, 04/27/2012  . Pneumococcal-Unspecified 01/26/2013  . Td 03/22/2009  . Zoster 03/22/2012  . Zoster Recombinat (Shingrix) 11/18/2017     Screening Tests Health Maintenance  Topic Date Due  . MAMMOGRAM  01/10/2019  . TETANUS/TDAP  03/23/2019  . DEXA SCAN  01/10/2020  . COLONOSCOPY  02/09/2021  . INFLUENZA VACCINE  Completed  . Hepatitis C Screening  Completed  . PNA vac Low Risk Adult  Completed      Plan:   See you next year!  Continue to eat heart healthy diet (full of fruits, vegetables, whole grains, lean protein, water--limit salt, fat, and sugar intake) and increase physical activity as tolerated.  Continue doing brain stimulating activities (puzzles, reading, adult coloring books, staying active) to keep memory sharp.     I have personally reviewed and noted the following in the patient's chart:   . Medical and social history . Use of alcohol, tobacco or illicit drugs  . Current medications and  supplements . Functional ability and status . Nutritional status . Physical activity . Advanced directives . List of other physicians . Hospitalizations, surgeries, and ER visits in previous 12 months . Vitals . Screenings to include cognitive, depression, and falls . Referrals and appointments  In addition, I have reviewed and discussed with patient certain preventive protocols, quality metrics, and best practice recommendations. A written personalized care plan for preventive services as well as general preventive health recommendations were provided to patient.     Shela Nevin, [place]  12/04/2018   PCP note: pt wanted you to know that she has scheduled her endo recheck 02/08/19. She also is going to start SL B12.

## 2018-12-04 ENCOUNTER — Ambulatory Visit (INDEPENDENT_AMBULATORY_CARE_PROVIDER_SITE_OTHER): Payer: Medicare HMO | Admitting: *Deleted

## 2018-12-04 ENCOUNTER — Other Ambulatory Visit: Payer: Self-pay

## 2018-12-04 ENCOUNTER — Encounter: Payer: Self-pay | Admitting: *Deleted

## 2018-12-04 DIAGNOSIS — Z0000 Encounter for general adult medical examination without abnormal findings: Secondary | ICD-10-CM | POA: Diagnosis not present

## 2018-12-04 NOTE — Patient Instructions (Signed)
See you next year!  Continue to eat heart healthy diet (full of fruits, vegetables, whole grains, lean protein, water--limit salt, fat, and sugar intake) and increase physical activity as tolerated.  Continue doing brain stimulating activities (puzzles, reading, adult coloring books, staying active) to keep memory sharp.    Kristen Schultz , Thank you for taking time to come for your Medicare Wellness Visit. I appreciate your ongoing commitment to your health goals. Please review the following plan we discussed and let me know if I can assist you in the future.   These are the goals we discussed: Goals    . Increase physical activity     Start back to [facility]       This is a list of the screening recommended for you and due dates:  Health Maintenance  Topic Date Due  . Mammogram  01/10/2019  . Tetanus Vaccine  03/23/2019  . DEXA scan (bone density measurement)  01/10/2020  . Colon Cancer Screening  02/09/2021  . Flu Shot  Completed  .  Hepatitis C: One time screening is recommended by Center for Disease Control  (CDC) for  adults born from [birth year] through 1965.   Completed  . Pneumonia vaccines  Completed    Health Maintenance After Age 73 After age 2, you are at a higher risk for certain long-term diseases and infections as well as injuries from falls. Falls are a major cause of broken bones and head injuries in people who are older than age 47. Getting regular preventive care can help to keep you healthy and well. Preventive care includes getting regular testing and making lifestyle changes as recommended by your health care provider. Talk with your health care provider about:  Which screenings and tests you should have. A screening is a test that checks for a disease when you have no symptoms.  A diet and exercise plan that is right for you. What should I know about screenings and tests to prevent falls? Screening and testing are the best ways to find a health problem early. Early  diagnosis and treatment give you the best chance of managing medical conditions that are common after age 47. Certain conditions and lifestyle choices may make you more likely to have a fall. Your health care provider may recommend:  Regular vision checks. Poor vision and conditions such as cataracts can make you more likely to have a fall. If you wear glasses, make sure to get your prescription updated if your vision changes.  Medicine review. Work with your health care provider to regularly review all of the medicines you are taking, including over-the-counter medicines. Ask your health care provider about any side effects that may make you more likely to have a fall. Tell your health care provider if any medicines that you take make you feel dizzy or sleepy.  Osteoporosis screening. Osteoporosis is a condition that causes the bones to get weaker. This can make the bones weak and cause them to break more easily.  Blood pressure screening. Blood pressure changes and medicines to control blood pressure can make you feel dizzy.  Strength and balance checks. Your health care provider may recommend certain tests to check your strength and balance while standing, walking, or changing positions.  Foot health exam. Foot pain and numbness, as well as not wearing proper footwear, can make you more likely to have a fall.  Depression screening. You may be more likely to have a fall if you have a fear of falling, feel  emotionally low, or feel unable to do activities that you used to do.  Alcohol use screening. Using too much alcohol can affect your balance and may make you more likely to have a fall. What actions can I take to lower my risk of falls? General instructions  Talk with your health care provider about your risks for falling. Tell your health care provider if: ? You fall. Be sure to tell your health care provider about all falls, even ones that seem minor. ? You feel dizzy, sleepy, or  off-balance.  Take over-the-counter and prescription medicines only as told by your health care provider. These include any supplements.  Eat a healthy diet and maintain a healthy weight. A healthy diet includes low-fat dairy products, low-fat (lean) meats, and fiber from whole grains, beans, and lots of fruits and vegetables. Home safety  Remove any tripping hazards, such as rugs, cords, and clutter.  Install safety equipment such as grab bars in bathrooms and safety rails on stairs.  Keep rooms and walkways well-lit. Activity   Follow a regular exercise program to stay fit. This will help you maintain your balance. Ask your health care provider what types of exercise are appropriate for you.  If you need a cane or walker, use it as recommended by your health care provider.  Wear supportive shoes that have nonskid soles. Lifestyle  Do not drink alcohol if your health care provider tells you not to drink.  If you drink alcohol, limit how much you have: ? 0-1 drink a day for women. ? 0-2 drinks a day for men.  Be aware of how much alcohol is in your drink. In the [place] one drink equals one typical bottle of beer (12 oz), one-half glass of wine (5 oz), or one shot of hard liquor (1 oz).  Do not use any products that contain nicotine or tobacco, such as cigarettes and e-cigarettes. If you need help quitting, ask your health care provider. Summary  Having a healthy lifestyle and getting preventive care can help to protect your health and wellness after age 18.  Screening and testing are the best way to find a health problem early and help you avoid having a fall. Early diagnosis and treatment give you the best chance for managing medical conditions that are more common for people who are older than age 2.  Falls are a major cause of broken bones and head injuries in people who are older than age 7. Take precautions to prevent a fall at home.  Work with your health care provider  to learn what changes you can make to improve your health and wellness and to prevent falls. This information is not intended to replace advice given to you by your health care provider. Make sure you discuss any questions you have with your health care provider. Document Released: 01/19/2017 Document Revised: 06/29/2018 Document Reviewed: 01/19/2017 Elsevier Patient Education  2020 [employer].

## 2018-12-11 DIAGNOSIS — E663 Overweight: Secondary | ICD-10-CM | POA: Diagnosis not present

## 2018-12-11 DIAGNOSIS — M2550 Pain in unspecified joint: Secondary | ICD-10-CM | POA: Diagnosis not present

## 2018-12-11 DIAGNOSIS — M150 Primary generalized (osteo)arthritis: Secondary | ICD-10-CM | POA: Diagnosis not present

## 2018-12-11 DIAGNOSIS — R748 Abnormal levels of other serum enzymes: Secondary | ICD-10-CM | POA: Diagnosis not present

## 2018-12-11 DIAGNOSIS — Z6826 Body mass index (BMI) 26.0-26.9, adult: Secondary | ICD-10-CM | POA: Diagnosis not present

## 2018-12-11 DIAGNOSIS — R768 Other specified abnormal immunological findings in serum: Secondary | ICD-10-CM | POA: Diagnosis not present

## 2019-01-05 ENCOUNTER — Other Ambulatory Visit: Payer: Self-pay | Admitting: Family Medicine

## 2019-01-08 ENCOUNTER — Other Ambulatory Visit: Payer: Self-pay | Admitting: Family Medicine

## 2019-01-12 ENCOUNTER — Other Ambulatory Visit: Payer: Self-pay | Admitting: Family Medicine

## 2019-01-15 ENCOUNTER — Other Ambulatory Visit: Payer: Self-pay | Admitting: *Deleted

## 2019-01-15 MED ORDER — LOSARTAN POTASSIUM 50 MG PO TABS: 50.0000 mg | ORAL_TABLET | Freq: Every day | ORAL | 0 refills | Status: DC

## 2019-01-24 DIAGNOSIS — R69 Illness, unspecified: Secondary | ICD-10-CM | POA: Diagnosis not present

## 2019-02-26 ENCOUNTER — Other Ambulatory Visit: Payer: Self-pay | Admitting: Family Medicine

## 2019-02-26 DIAGNOSIS — Z1231 Encounter for screening mammogram for malignant neoplasm of breast: Secondary | ICD-10-CM

## 2019-02-27 ENCOUNTER — Ambulatory Visit
Admission: RE | Admit: 2019-02-27 | Discharge: 2019-02-27 | Disposition: A | Discharge status: Home / Self Care | Payer: Medicare HMO | Source: Ambulatory Visit | Attending: Family Medicine | Admitting: Family Medicine

## 2019-02-27 ENCOUNTER — Other Ambulatory Visit: Payer: Self-pay

## 2019-02-27 DIAGNOSIS — Z1231 Encounter for screening mammogram for malignant neoplasm of breast: Secondary | ICD-10-CM

## 2019-03-05 DIAGNOSIS — Z6826 Body mass index (BMI) 26.0-26.9, adult: Secondary | ICD-10-CM | POA: Diagnosis not present

## 2019-03-05 DIAGNOSIS — M150 Primary generalized (osteo)arthritis: Secondary | ICD-10-CM | POA: Diagnosis not present

## 2019-03-05 DIAGNOSIS — R748 Abnormal levels of other serum enzymes: Secondary | ICD-10-CM | POA: Diagnosis not present

## 2019-03-05 DIAGNOSIS — M2550 Pain in unspecified joint: Secondary | ICD-10-CM | POA: Diagnosis not present

## 2019-03-05 DIAGNOSIS — E663 Overweight: Secondary | ICD-10-CM | POA: Diagnosis not present

## 2019-03-05 DIAGNOSIS — R768 Other specified abnormal immunological findings in serum: Secondary | ICD-10-CM | POA: Diagnosis not present

## 2019-03-23 HISTORY — PX: DENTAL SURGERY: SHX609

## 2019-04-11 ENCOUNTER — Other Ambulatory Visit: Payer: Self-pay | Admitting: Family Medicine

## 2019-04-17 ENCOUNTER — Ambulatory Visit: Payer: Medicare HMO

## 2019-04-26 ENCOUNTER — Ambulatory Visit: Payer: Medicare HMO | Attending: Internal Medicine

## 2019-04-26 ENCOUNTER — Ambulatory Visit: Payer: Medicare HMO

## 2019-04-26 DIAGNOSIS — Z23 Encounter for immunization: Secondary | ICD-10-CM | POA: Insufficient documentation

## 2019-04-26 NOTE — Progress Notes (Signed)
   Covid-19 Vaccination Clinic  Name:  Kristen Schultz    MRN: [id number] DOB: [birth year]  04/26/2019  [name] was observed post Covid-19 immunization for 15 minutes without incidence. She was provided with Vaccine Information Sheet and instruction to access the V-Safe system.   [name] was instructed to call 911 with any severe reactions post vaccine: Marland Kitchen Difficulty breathing  . Swelling of your face and throat  . A fast heartbeat  . A bad rash all over your body  . Dizziness and weakness    Immunizations Administered    Name Date Dose VIS Date Route   Pfizer COVID-19 Vaccine 04/26/2019  1:10 PM 0.3 mL 03/02/2019 Intramuscular   Manufacturer: [place]   Lot: [id number]   [place]: [id number]

## 2019-05-02 ENCOUNTER — Other Ambulatory Visit: Payer: Self-pay | Admitting: Family Medicine

## 2019-05-02 DIAGNOSIS — F4323 Adjustment disorder with mixed anxiety and depressed mood: Secondary | ICD-10-CM

## 2019-05-21 ENCOUNTER — Ambulatory Visit: Payer: Medicare HMO | Attending: Internal Medicine

## 2019-05-21 DIAGNOSIS — Z23 Encounter for immunization: Secondary | ICD-10-CM | POA: Insufficient documentation

## 2019-05-21 NOTE — Progress Notes (Signed)
   Covid-19 Vaccination Clinic  Name:  Kristen Schultz    MRN: [id number] DOB: September 23, 1945  05/21/2019  [name] was observed post Covid-19 immunization for 15 minutes without incidence. She was provided with Vaccine Information Sheet and instruction to access the V-Safe system.   [name] was instructed to call 911 with any severe reactions post vaccine: Marland Kitchen Difficulty breathing  . Swelling of your face and throat  . A fast heartbeat  . A bad rash all over your body  . Dizziness and weakness    Immunizations Administered    Name Date Dose VIS Date Route   Pfizer COVID-19 Vaccine 05/21/2019  4:09 PM 0.3 mL 03/02/2019 Intramuscular   Manufacturer: [place]   Lot: [id number]   [place]: [id number]

## 2019-05-28 ENCOUNTER — Other Ambulatory Visit: Payer: Self-pay | Admitting: Family Medicine

## 2019-05-28 DIAGNOSIS — F4323 Adjustment disorder with mixed anxiety and depressed mood: Secondary | ICD-10-CM

## 2019-06-01 DIAGNOSIS — R69 Illness, unspecified: Secondary | ICD-10-CM | POA: Diagnosis not present

## 2019-06-06 ENCOUNTER — Other Ambulatory Visit: Payer: Self-pay

## 2019-06-07 ENCOUNTER — Other Ambulatory Visit: Payer: Self-pay

## 2019-06-07 ENCOUNTER — Encounter: Payer: Self-pay | Admitting: Family Medicine

## 2019-06-07 ENCOUNTER — Ambulatory Visit (INDEPENDENT_AMBULATORY_CARE_PROVIDER_SITE_OTHER): Payer: Medicare HMO | Admitting: Family Medicine

## 2019-06-07 VITALS — BP 120/68 | HR 65 | Temp 97.8°F | Resp 18 | Ht 65.0 in | Wt 156.6 lb

## 2019-06-07 DIAGNOSIS — Z0000 Encounter for general adult medical examination without abnormal findings: Secondary | ICD-10-CM

## 2019-06-07 DIAGNOSIS — R748 Abnormal levels of other serum enzymes: Secondary | ICD-10-CM | POA: Diagnosis not present

## 2019-06-07 DIAGNOSIS — E559 Vitamin D deficiency, unspecified: Secondary | ICD-10-CM

## 2019-06-07 DIAGNOSIS — E538 Deficiency of other specified B group vitamins: Secondary | ICD-10-CM

## 2019-06-07 DIAGNOSIS — F411 Generalized anxiety disorder: Secondary | ICD-10-CM

## 2019-06-07 DIAGNOSIS — F4323 Adjustment disorder with mixed anxiety and depressed mood: Secondary | ICD-10-CM

## 2019-06-07 DIAGNOSIS — I10 Essential (primary) hypertension: Secondary | ICD-10-CM | POA: Diagnosis not present

## 2019-06-07 DIAGNOSIS — R69 Illness, unspecified: Secondary | ICD-10-CM | POA: Diagnosis not present

## 2019-06-07 LAB — COMPREHENSIVE METABOLIC PANEL
ALT: 14 U/L (ref 0–35)
AST: 21 U/L (ref 0–37)
Albumin: 4.1 g/dL (ref 3.5–5.2)
Alkaline Phosphatase: 70 U/L (ref 39–117)
BUN: 12 mg/dL (ref 6–23)
CO2: 30 mEq/L (ref 19–32)
Calcium: 9.1 mg/dL (ref 8.4–10.5)
Chloride: 101 mEq/L (ref 96–112)
Creatinine, Ser: 0.66 mg/dL (ref 0.40–1.20)
GFR: 87.73 mL/min (ref >60.00)
Glucose, Bld: 81 mg/dL (ref 70–99)
Potassium: 4.5 mEq/L (ref 3.5–5.1)
Sodium: 138 mEq/L (ref 135–145)
Total Bilirubin: 0.5 mg/dL (ref 0.2–1.2)
Total Protein: 7 g/dL (ref 6.0–8.3)

## 2019-06-07 LAB — LIPID PANEL
Cholesterol: 206 mg/dL — ABNORMAL HIGH (ref 0–200)
HDL: 82 mg/dL (ref >39.00)
LDL Cholesterol: 108 mg/dL — ABNORMAL HIGH (ref 0–99)
NonHDL: 123.96
Total CHOL/HDL Ratio: 3
Triglycerides: 78 mg/dL (ref 0.0–149.0)
VLDL: 15.6 mg/dL (ref 0.0–40.0)

## 2019-06-07 LAB — VITAMIN B12: Vitamin B-12: 504 pg/mL (ref 211–911)

## 2019-06-07 LAB — VITAMIN D 25 HYDROXY (VIT D DEFICIENCY, FRACTURES): VITD: 61.95 ng/mL (ref 30.00–100.00)

## 2019-06-07 LAB — AMYLASE: Amylase: 418 U/L — ABNORMAL HIGH (ref 27–131)

## 2019-06-07 MED ORDER — CITALOPRAM HYDROBROMIDE 10 MG PO TABS: 10.0000 mg | ORAL_TABLET | Freq: Every day | ORAL | 1 refills | Status: DC

## 2019-06-07 NOTE — Progress Notes (Signed)
Subjective:     Kristen Schultz is a 74 y.o. female and is here for a comprehensive physical exam. The patient reports no problems.----- pt needs refills but no new problems   Social History   Socioeconomic History  . Marital status: Married    Spouse name: [name]  . Number of children: 1  . Years of education: Not on file  . Highest education level: Not on file  Occupational History  . Occupation: retired    Comment: retired  Tobacco Use  . Smoking status: Former Smoker    Packs/day: 0.30    Years: 15.00    Pack years: 4.50    Quit date: 11/17/1985    Years since quitting: 33.5  . Smokeless tobacco: Never Used  Substance and Sexual Activity  . Alcohol use: Yes    Alcohol/week: 14.0 standard drinks    Types: 14 Glasses of wine per week    Comment: 2 glasses per night  . Drug use: No  . Sexual activity: Not Currently    Partners: Male  Other Topics Concern  . Not on file  Social History Narrative   Exercise- Walks about 15-22 miles per week with womens group.   Social Determinants of Health   Financial Resource Strain:   . Difficulty of Paying Living Expenses:   Food Insecurity:   . Worried About Charity fundraiser in the Last Year:   . Arboriculturist in the Last Year:   Transportation Needs:   . Film/video editor (Medical):   Marland Kitchen Lack of Transportation (Non-Medical):   Physical Activity:   . Days of Exercise per Week:   . Minutes of Exercise per Session:   Stress:   . Feeling of Stress :   Social Connections:   . Frequency of Communication with Friends and Family:   . Frequency of Social Gatherings with Friends and Family:   . Attends Religious Services:   . Active Member of Clubs or Organizations:   . Attends Archivist Meetings:   Marland Kitchen Marital Status:   Intimate Partner Violence:   . Fear of Current or Ex-Partner:   . Emotionally Abused:   Marland Kitchen Physically Abused:   . Sexually Abused:    Health Maintenance  Topic Date Due  . TETANUS/TDAP   03/23/2019  . DEXA SCAN  01/10/2020  . MAMMOGRAM  02/27/2020  . COLONOSCOPY  02/09/2021  . INFLUENZA VACCINE  Completed  . Hepatitis C Screening  Completed  . PNA vac Low Risk Adult  Completed    The following portions of the patient's history were reviewed and updated as appropriate:  She  has a past medical history of Anxiety, Colon polyps, Gastric ulcer, Heart murmur, Hyperlipidemia, Hypertension, Liver cyst, Ovarian cyst (1984), and Pneumonia. She does not have any pertinent problems on file. She  has a past surgical history that includes Hemorrhoid surgery; Laparoscopic liver cyst removal; Ovarian cyst surgery; Dental surgery (2000); and Colonoscopy (02/10/2016). Her family history includes Dementia in her sister; Heart disease in her brother and father; Heart disease (age of onset: 31) in her brother. She  reports that she quit smoking about 33 years ago. She has a 4.50 pack-year smoking history. She has never used smokeless tobacco. She reports current alcohol use of about 14.0 standard drinks of alcohol per week. She reports that she does not use drugs. She has a current medication list which includes the following prescription(s): citalopram, losartan, and multivitamin. Current Outpatient Medications on File Prior  to Visit  Medication Sig Dispense Refill  . losartan (COZAAR) 50 MG tablet TAKE 1 TABLET BY MOUTH EVERY DAY 90 tablet 1  . Multiple Vitamin (MULTIVITAMIN) capsule Take 1 capsule by mouth daily.     No current facility-administered medications on file prior to visit.   She is allergic to bee venom; codeine; other; penicillins; tetracyclines & related; and xylocaine dental [lidocaine-epinephrine]..  Review of Systems  Review of Systems  Constitutional: Negative for activity change, appetite change and fatigue.  HENT: Negative for hearing loss, congestion, tinnitus and ear discharge.   Eyes: Negative for visual disturbance (see optho q1y -- vision corrected to 20/20 with  glasses).  Respiratory: Negative for cough, chest tightness and shortness of breath.   Cardiovascular: Negative for chest pain, palpitations and leg swelling.  Gastrointestinal: Negative for abdominal pain, diarrhea, constipation and abdominal distention.  Genitourinary: Negative for urgency, frequency, decreased urine volume and difficulty urinating.  Musculoskeletal: Negative for back pain, arthralgias and gait problem.  Skin: Negative for color change, pallor and rash.  Neurological: Negative for dizziness, light-headedness, numbness and headaches.  Hematological: Negative for adenopathy. Does not bruise/bleed easily.  Psychiatric/Behavioral: Negative for suicidal ideas, confusion, sleep disturbance, self-injury, dysphoric mood, decreased concentration and agitation.  Pt is able to read and write and can do all ADLs No risk for falling No abuse/ violence in home     Objective:    BP 120/68 (BP Location: Right Arm, Patient Position: Sitting, Cuff Size: Normal)   Pulse 65   Temp 97.8 F (36.6 C) (Temporal)   Resp 18   Ht 5\' 5"  (1.651 m)   Wt 156 lb 9.6 oz (71 kg)   SpO2 97%   BMI 26.06 kg/m  General appearance: alert, cooperative, appears stated age and no distress Head: Normocephalic, without obvious abnormality, atraumatic Eyes: negative findings: lids and lashes normal, conjunctivae and sclerae normal and pupils equal, round, reactive to light and accomodation Ears: normal TM's and external ear canals both ears Neck: no adenopathy, no carotid bruit, no JVD, supple, symmetrical, trachea midline and thyroid not enlarged, symmetric, no tenderness/mass/nodules Back: symmetric, no curvature. ROM normal. No CVA tenderness. Lungs: clear to auscultation bilaterally Breasts: normal appearance, no masses or tenderness Heart: regular rate and rhythm, S1, S2 normal, no murmur, click, rub or gallop Abdomen: soft, non-tender; bowel sounds normal; no masses,  no organomegaly Pelvic: not  indicated; post-menopausal, no abnormal Pap smears in past Extremities: extremities normal, atraumatic, no cyanosis or edema Pulses: 2+ and symmetric Skin: Skin color, texture, turgor normal. No rashes or lesions Lymph nodes: Cervical, supraclavicular, and axillary nodes normal. Neurologic: Alert and oriented X 3, normal strength and tone. Normal symmetric reflexes. Normal coordination and gait    Assessment:    Healthy female exam.      Plan:    ghm utd Check labs  See After Visit Summary for Counseling Recommendations    1. Adjustment disorder with mixed anxiety and depressed mood  - citalopram (CELEXA) 10 MG tablet; Take 1 tablet (10 mg total) by mouth daily.  Dispense: 90 tablet; Refill:1  2. Elevated amylase F/u GI Recheck labs  - Amylase  3. Vitamin D deficiency Check labs  - Vitamin D (25 hydroxy)  4. Essential hypertension Well controlled, no changes to meds. Encouraged heart healthy diet such as the DASH diet and exercise as tolerated.   - Lipid panel - Comprehensive metabolic panel  5. Vitamin B12 deficiency Recheck labs  - Vitamin B12  6. Preventative health care  See above   7. Generalized anxiety disorder Stable con't meds

## 2019-06-07 NOTE — Assessment & Plan Note (Signed)
ghm utd Check labs See AVS 

## 2019-06-07 NOTE — Assessment & Plan Note (Signed)
Stable con't meds 

## 2019-06-07 NOTE — Patient Instructions (Signed)
Preventive Care 74 Years and Older, Female Preventive care refers to lifestyle choices and visits with your health care provider that can promote health and wellness. This includes:  A yearly physical exam. This is also called an annual well check.  Regular dental and eye exams.  Immunizations.  Screening for certain conditions.  Healthy lifestyle choices, such as diet and exercise. What can I expect for my preventive care visit? Physical exam Your health care provider will check:  Height and weight. These may be used to calculate body mass index (BMI), which is a measurement that tells if you are at a healthy weight.  Heart rate and blood pressure.  Your skin for abnormal spots. Counseling Your health care provider may ask you questions about:  Alcohol, tobacco, and drug use.  Emotional well-being.  Home and relationship well-being.  Sexual activity.  Eating habits.  History of falls.  Memory and ability to understand (cognition).  Work and work Statistician.  Pregnancy and menstrual history. What immunizations do I need?  Influenza (flu) vaccine  This is recommended every year. Tetanus, diphtheria, and pertussis (Tdap) vaccine  You may need a Td booster every 10 years. Varicella (chickenpox) vaccine  You may need this vaccine if you have not already been vaccinated. Zoster (shingles) vaccine  You may need this after age 33. Pneumococcal conjugate (PCV13) vaccine  One dose is recommended after age 33. Pneumococcal polysaccharide (PPSV23) vaccine  One dose is recommended after age 72. Measles, mumps, and rubella (MMR) vaccine  You may need at least one dose of MMR if you were born in 1957 or later. You may also need a second dose. Meningococcal conjugate (MenACWY) vaccine  You may need this if you have certain conditions. Hepatitis A vaccine  You may need this if you have certain conditions or if you travel or work in places where you may be exposed  to hepatitis A. Hepatitis B vaccine  You may need this if you have certain conditions or if you travel or work in places where you may be exposed to hepatitis B. Haemophilus influenzae type b (Hib) vaccine  You may need this if you have certain conditions. You may receive vaccines as individual doses or as more than one vaccine together in one shot (combination vaccines). Talk with your health care provider about the risks and benefits of combination vaccines. What tests do I need? Blood tests  Lipid and cholesterol levels. These may be checked every 5 years, or more frequently depending on your overall health.  Hepatitis C test.  Hepatitis B test. Screening  Lung cancer screening. You may have this screening every year starting at age 39 if you have a 30-pack-year history of smoking and currently smoke or have quit within the past 15 years.  Colorectal cancer screening. All adults should have this screening starting at age 36 and continuing until age 15. Your health care provider may recommend screening at age 23 if you are at increased risk. You will have tests every 1-10 years, depending on your results and the type of screening test.  Diabetes screening. This is done by checking your blood sugar (glucose) after you have not eaten for a while (fasting). You may have this done every 1-3 years.  Mammogram. This may be done every 1-2 years. Talk with your health care provider about how often you should have regular mammograms.  BRCA-related cancer screening. This may be done if you have a family history of breast, ovarian, tubal, or peritoneal cancers.  Other tests  Sexually transmitted disease (STD) testing.  Bone density scan. This is done to screen for osteoporosis. You may have this done starting at age 44. Follow these instructions at home: Eating and drinking  Eat a diet that includes fresh fruits and vegetables, whole grains, lean protein, and low-fat dairy products. Limit  your intake of foods with high amounts of sugar, saturated fats, and salt.  Take vitamin and mineral supplements as recommended by your health care provider.  Do not drink alcohol if your health care provider tells you not to drink.  If you drink alcohol: ? Limit how much you have to 0-1 drink a day. ? Be aware of how much alcohol is in your drink. In the [place] one drink equals one 12 oz bottle of beer (355 mL), one 5 oz glass of wine (148 mL), or one 1 oz glass of hard liquor (44 mL). Lifestyle  Take daily care of your teeth and gums.  Stay active. Exercise for at least 30 minutes on 5 or more days each week.  Do not use any products that contain nicotine or tobacco, such as cigarettes, e-cigarettes, and chewing tobacco. If you need help quitting, ask your health care provider.  If you are sexually active, practice safe sex. Use a condom or other form of protection in order to prevent STIs (sexually transmitted infections).  Talk with your health care provider about taking a low-dose aspirin or statin. What's next?  Go to your health care provider once a year for a well check visit.  Ask your health care provider how often you should have your eyes and teeth checked.  Stay up to date on all vaccines. This information is not intended to replace advice given to you by your health care provider. Make sure you discuss any questions you have with your health care provider. Document Revised: 03/02/2018 Document Reviewed: 03/02/2018 Elsevier Patient Education  2020 [employer].

## 2019-06-07 NOTE — Assessment & Plan Note (Signed)
Well controlled, no changes to meds. Encouraged heart healthy diet such as the DASH diet and exercise as tolerated.  °

## 2019-06-07 NOTE — Assessment & Plan Note (Signed)
Recheck labs F/u GI

## 2019-07-10 DIAGNOSIS — R69 Illness, unspecified: Secondary | ICD-10-CM | POA: Diagnosis not present

## 2019-07-19 DIAGNOSIS — H2513 Age-related nuclear cataract, bilateral: Secondary | ICD-10-CM | POA: Diagnosis not present

## 2019-07-23 DIAGNOSIS — K219 Gastro-esophageal reflux disease without esophagitis: Secondary | ICD-10-CM | POA: Diagnosis not present

## 2019-07-23 DIAGNOSIS — K317 Polyp of stomach and duodenum: Secondary | ICD-10-CM | POA: Diagnosis not present

## 2019-07-23 DIAGNOSIS — K2270 Barrett's esophagus without dysplasia: Secondary | ICD-10-CM | POA: Diagnosis not present

## 2019-07-23 DIAGNOSIS — R7989 Other specified abnormal findings of blood chemistry: Secondary | ICD-10-CM | POA: Diagnosis not present

## 2019-07-23 DIAGNOSIS — K3189 Other diseases of stomach and duodenum: Secondary | ICD-10-CM | POA: Diagnosis not present

## 2019-07-23 DIAGNOSIS — K2950 Unspecified chronic gastritis without bleeding: Secondary | ICD-10-CM | POA: Diagnosis not present

## 2019-07-23 HISTORY — PX: EUS: SHX5427

## 2019-07-23 HISTORY — PX: ESOPHAGOGASTRODUODENOSCOPY: SHX1529

## 2019-07-26 DIAGNOSIS — R69 Illness, unspecified: Secondary | ICD-10-CM | POA: Diagnosis not present

## 2019-08-01 ENCOUNTER — Encounter: Payer: Self-pay | Admitting: Family Medicine

## 2019-08-01 NOTE — Telephone Encounter (Signed)
Please advise 

## 2019-10-02 ENCOUNTER — Other Ambulatory Visit: Payer: Self-pay | Admitting: Family Medicine

## 2019-12-10 ENCOUNTER — Encounter: Payer: Self-pay | Admitting: Family Medicine

## 2019-12-10 ENCOUNTER — Other Ambulatory Visit: Payer: Self-pay

## 2019-12-10 ENCOUNTER — Ambulatory Visit (INDEPENDENT_AMBULATORY_CARE_PROVIDER_SITE_OTHER): Payer: Medicare HMO | Admitting: Family Medicine

## 2019-12-10 VITALS — BP 112/70 | HR 58 | Temp 99.0°F | Resp 18 | Ht 65.0 in | Wt 154.6 lb

## 2019-12-10 DIAGNOSIS — Z23 Encounter for immunization: Secondary | ICD-10-CM | POA: Diagnosis not present

## 2019-12-10 DIAGNOSIS — R001 Bradycardia, unspecified: Secondary | ICD-10-CM

## 2019-12-10 DIAGNOSIS — F4323 Adjustment disorder with mixed anxiety and depressed mood: Secondary | ICD-10-CM

## 2019-12-10 DIAGNOSIS — E538 Deficiency of other specified B group vitamins: Secondary | ICD-10-CM

## 2019-12-10 DIAGNOSIS — K635 Polyp of colon: Secondary | ICD-10-CM | POA: Diagnosis not present

## 2019-12-10 DIAGNOSIS — R1030 Lower abdominal pain, unspecified: Secondary | ICD-10-CM | POA: Diagnosis not present

## 2019-12-10 DIAGNOSIS — E2839 Other primary ovarian failure: Secondary | ICD-10-CM

## 2019-12-10 DIAGNOSIS — R69 Illness, unspecified: Secondary | ICD-10-CM | POA: Diagnosis not present

## 2019-12-10 DIAGNOSIS — I10 Essential (primary) hypertension: Secondary | ICD-10-CM

## 2019-12-10 DIAGNOSIS — R002 Palpitations: Secondary | ICD-10-CM | POA: Diagnosis not present

## 2019-12-10 DIAGNOSIS — R748 Abnormal levels of other serum enzymes: Secondary | ICD-10-CM | POA: Diagnosis not present

## 2019-12-10 MED ORDER — CITALOPRAM HYDROBROMIDE 10 MG PO TABS: 10.0000 mg | ORAL_TABLET | Freq: Every day | ORAL | 1 refills | Status: DC

## 2019-12-10 MED ORDER — LOSARTAN POTASSIUM 50 MG PO TABS: 50.0000 mg | ORAL_TABLET | Freq: Every day | ORAL | 1 refills | Status: DC

## 2019-12-10 NOTE — Progress Notes (Signed)
Patient ID: Kristen Schultz, female    DOB: 11/28/1945  Age: 74 y.o. MRN: [id number]    Subjective:  Subjective  HPI  [facility] presents for multiple problems ----  Pt needs f/u bp and c/o palpitations.  She used to see a cardiologist but has not in years.   She also needs a colonoscopy per Dr Delrae Alfred at [facility]  No chest pains  Pt also c/o some abd issues still.  She had a egd with Dr Delrae Alfred but pt states he told her she needs another colonoscopy -- pt would like to see Dr Lyndel Safe first though Review of Systems  Constitutional: Negative for appetite change, diaphoresis, fatigue and unexpected weight change.  Eyes: Negative for pain, redness and visual disturbance.  Respiratory: Negative for cough, chest tightness, shortness of breath and wheezing.   Cardiovascular: Positive for palpitations. Negative for chest pain and leg swelling.  Gastrointestinal: Positive for abdominal pain.  Endocrine: Negative for cold intolerance, heat intolerance, polydipsia, polyphagia and polyuria.  Genitourinary: Negative for difficulty urinating, dysuria and frequency.  Neurological: Negative for dizziness, light-headedness, numbness and headaches.    History Past Medical History:  Diagnosis Date  . Anxiety   . Colon polyps   . Gastric ulcer    per patient; in the 1970s  . Heart murmur   . Hyperlipidemia    no treatment per pt  . Hypertension   . Liver cyst   . Ovarian cyst 1984  . Pneumonia     She has a past surgical history that includes Hemorrhoid surgery; Laparoscopic liver cyst removal; Ovarian cyst surgery; Dental surgery (2000); and Colonoscopy (02/10/2016).   Her family history includes Dementia in her sister; Heart disease in her brother and father; Heart disease (age of onset: 53) in her brother.She reports that she quit smoking about 34 years ago. She has a 4.50 pack-year smoking history. She has never used smokeless tobacco. She reports current alcohol use of about 14.0 standard  drinks of alcohol per week. She reports that she does not use drugs.  Current Outpatient Medications on File Prior to Visit  Medication Sig Dispense Refill  . Multiple Vitamin (MULTIVITAMIN) capsule Take 1 capsule by mouth daily.     No current facility-administered medications on file prior to visit.     Objective:  Objective  Physical Exam Vitals and nursing note reviewed.  Constitutional:      Appearance: She is well-developed.  HENT:     Head: Normocephalic and atraumatic.  Eyes:     Conjunctiva/sclera: Conjunctivae normal.  Neck:     Thyroid: No thyromegaly.     Vascular: No carotid bruit or JVD.  Cardiovascular:     Rate and Rhythm: Normal rate and regular rhythm.     Heart sounds: Normal heart sounds. No murmur heard.   Pulmonary:     Effort: Pulmonary effort is normal. No respiratory distress.     Breath sounds: Normal breath sounds. No wheezing or rales.  Chest:     Chest wall: No tenderness.  Musculoskeletal:     Cervical back: Normal range of motion and neck supple.  Neurological:     Mental Status: She is alert and oriented to person, place, and time.    BP 112/70 (BP Location: Right Arm, Patient Position: Sitting, Cuff Size: Normal)   Pulse (!) 58   Temp 99 F (37.2 C) (Oral)   Resp 18   Ht 5\' 5"  (1.651 m)   Wt 154 lb 9.6 oz (70.1 kg)  SpO2 97%   BMI 25.73 kg/m  Wt Readings from Last 3 Encounters:  12/10/19 154 lb 9.6 oz (70.1 kg)  06/07/19 156 lb 9.6 oz (71 kg)  11/28/18 156 lb 9.6 oz (71 kg)     Lab Results  Component Value Date   WBC 7.0 06/29/2018   HGB 13.6 06/29/2018   HCT 41.5 06/29/2018   PLT 286 06/29/2018   GLUCOSE 81 06/07/2019   CHOL 206 (H) 06/07/2019   TRIG 78.0 06/07/2019   HDL 82.00 06/07/2019   LDLCALC 108 (H) 06/07/2019   ALT 14 06/07/2019   AST 21 06/07/2019   NA 138 06/07/2019   K 4.5 06/07/2019   CL 101 06/07/2019   CREATININE 0.66 06/07/2019   BUN 12 06/07/2019   CO2 30 06/07/2019   TSH 1.56 01/07/2017    MICROALBUR <0.7 05/01/2018    MM 3D SCREEN BREAST BILATERAL  Result Date: 02/27/2019 CLINICAL DATA:  Screening. EXAM: DIGITAL SCREENING BILATERAL MAMMOGRAM WITH TOMO AND CAD COMPARISON:  Previous exam(s). ACR Breast Density Category b: There are scattered areas of fibroglandular density. FINDINGS: There are no findings suspicious for malignancy. Images were processed with CAD. IMPRESSION: No mammographic evidence of malignancy. A result letter of this screening mammogram will be mailed directly to the patient. RECOMMENDATION: Screening mammogram in one year. (Code:SM-B-01Y) BI-RADS CATEGORY  1: Negative. Electronically Signed   By: Zerita Boers M.D.   On: 02/27/2019 15:58     Assessment & Plan:  Plan  I have changed Kristen Schultz's losartan. I am also having her maintain her multivitamin and citalopram.  Meds ordered this encounter  Medications  . losartan (COZAAR) 50 MG tablet    Sig: Take 1 tablet (50 mg total) by mouth daily.    Dispense:  90 tablet    Refill:  1  . citalopram (CELEXA) 10 MG tablet    Sig: Take 1 tablet (10 mg total) by mouth daily.    Dispense:  90 tablet    Refill:  1    Problem List Items Addressed This Visit      Unprioritized   Adjustment disorder with mixed anxiety and depressed mood   Relevant Medications   citalopram (CELEXA) 10 MG tablet   B12 deficiency    Recheck labs today      Relevant Orders   Vitamin B12   Bradycardia - Primary    ekg ---  Sinus brady Pt now having episodes of dizziness will refer back to cardiology  Check echo and event monitor  Check labs       Relevant Orders   Ambulatory referral to Cardiology   Elevated amylase    Repeat today      Relevant Orders   Amylase   Essential hypertension   Relevant Medications   losartan (COZAAR) 50 MG tablet   Other Relevant Orders   Lipid panel   Comprehensive metabolic panel   Cardiac event monitor   Lower abdominal pain   Relevant Orders   Ambulatory referral to  Gastroenterology   Palpitation   Relevant Orders   EKG 12-Lead (Completed)   ECHOCARDIOGRAM COMPLETE   Cardiac event monitor   Ambulatory referral to Cardiology   TSH   Polyp of colon   Relevant Orders   Ambulatory referral to Gastroenterology    Other Visit Diagnoses    Estrogen deficiency       Relevant Orders   DG Bone Density   Need for influenza vaccination  Relevant Orders   Flu Vaccine QUAD High Dose(Fluad) (Completed)      Follow-up: Return in about 6 months (around 06/08/2020), or if symptoms worsen or fail to improve, for annual exam, fasting.  [name], DO

## 2019-12-10 NOTE — Assessment & Plan Note (Signed)
ekg ---  Sinus brady Pt now having episodes of dizziness will refer back to cardiology  Check echo and event monitor  Check labs

## 2019-12-10 NOTE — Assessment & Plan Note (Signed)
Recheck labs today. 

## 2019-12-10 NOTE — Assessment & Plan Note (Signed)
Repeat today

## 2019-12-10 NOTE — Patient Instructions (Signed)

## 2019-12-11 ENCOUNTER — Encounter: Payer: Self-pay | Admitting: Gastroenterology

## 2019-12-11 ENCOUNTER — Other Ambulatory Visit: Payer: Self-pay | Admitting: Family Medicine

## 2019-12-11 DIAGNOSIS — Z1231 Encounter for screening mammogram for malignant neoplasm of breast: Secondary | ICD-10-CM

## 2019-12-11 LAB — COMPREHENSIVE METABOLIC PANEL
AG Ratio: 1.8 (calc) (ref 1.0–2.5)
ALT: 10 U/L (ref 6–29)
AST: 18 U/L (ref 10–35)
Albumin: 4.3 g/dL (ref 3.6–5.1)
Alkaline phosphatase (APISO): 55 U/L (ref 37–153)
BUN: 14 mg/dL (ref 7–25)
CO2: 31 mmol/L (ref 20–32)
Calcium: 9.6 mg/dL (ref 8.6–10.4)
Chloride: 103 mmol/L (ref 98–110)
Creat: 0.64 mg/dL (ref 0.60–0.93)
Globulin: 2.4 g/dL (calc) (ref 1.9–3.7)
Glucose, Bld: 87 mg/dL (ref 65–99)
Potassium: 5 mmol/L (ref 3.5–5.3)
Sodium: 139 mmol/L (ref 135–146)
Total Bilirubin: 0.7 mg/dL (ref 0.2–1.2)
Total Protein: 6.7 g/dL (ref 6.1–8.1)

## 2019-12-11 LAB — LIPID PANEL
Cholesterol: 225 mg/dL — ABNORMAL HIGH (ref <200)
HDL: 90 mg/dL (ref >=50)
LDL Cholesterol (Calc): 120 mg/dL (calc) — ABNORMAL HIGH
Non-HDL Cholesterol (Calc): 135 mg/dL (calc) — ABNORMAL HIGH (ref <130)
Total CHOL/HDL Ratio: 2.5 (calc) (ref <5.0)
Triglycerides: 54 mg/dL (ref <150)

## 2019-12-11 LAB — AMYLASE: Amylase: 482 U/L — ABNORMAL HIGH (ref 21–101)

## 2019-12-11 LAB — VITAMIN B12: Vitamin B-12: 852 pg/mL (ref 200–1100)

## 2019-12-11 LAB — TSH: TSH: 1.45 mIU/L (ref 0.40–4.50)

## 2019-12-12 ENCOUNTER — Encounter: Payer: Self-pay | Admitting: Family Medicine

## 2019-12-13 ENCOUNTER — Ambulatory Visit (HOSPITAL_COMMUNITY): Payer: Medicare HMO | Attending: Internal Medicine

## 2019-12-13 ENCOUNTER — Other Ambulatory Visit: Payer: Self-pay

## 2019-12-13 DIAGNOSIS — Z87891 Personal history of nicotine dependence: Secondary | ICD-10-CM | POA: Diagnosis not present

## 2019-12-13 DIAGNOSIS — R002 Palpitations: Secondary | ICD-10-CM | POA: Insufficient documentation

## 2019-12-13 DIAGNOSIS — E785 Hyperlipidemia, unspecified: Secondary | ICD-10-CM | POA: Diagnosis not present

## 2019-12-13 DIAGNOSIS — I10 Essential (primary) hypertension: Secondary | ICD-10-CM | POA: Diagnosis not present

## 2019-12-13 DIAGNOSIS — R011 Cardiac murmur, unspecified: Secondary | ICD-10-CM | POA: Insufficient documentation

## 2019-12-13 DIAGNOSIS — R42 Dizziness and giddiness: Secondary | ICD-10-CM | POA: Insufficient documentation

## 2019-12-13 DIAGNOSIS — Z8249 Family history of ischemic heart disease and other diseases of the circulatory system: Secondary | ICD-10-CM | POA: Insufficient documentation

## 2019-12-13 LAB — ECHOCARDIOGRAM COMPLETE
Area-P 1/2: 2.73 cm2
MV M vel: 5.84 m/s
MV Peak grad: 136.4 mmHg
S' Lateral: 2.35 cm

## 2019-12-14 ENCOUNTER — Telehealth: Payer: Self-pay | Admitting: Family Medicine

## 2019-12-14 ENCOUNTER — Other Ambulatory Visit: Payer: Self-pay | Admitting: Family Medicine

## 2019-12-14 ENCOUNTER — Ambulatory Visit (HOSPITAL_BASED_OUTPATIENT_CLINIC_OR_DEPARTMENT_OTHER)
Admission: RE | Admit: 2019-12-14 | Discharge: 2019-12-14 | Disposition: A | Discharge status: Home / Self Care | Payer: Medicare HMO | Source: Ambulatory Visit | Attending: Family Medicine | Admitting: Family Medicine

## 2019-12-14 DIAGNOSIS — R0781 Pleurodynia: Secondary | ICD-10-CM | POA: Diagnosis not present

## 2019-12-14 DIAGNOSIS — S2232XA Fracture of one rib, left side, initial encounter for closed fracture: Secondary | ICD-10-CM | POA: Diagnosis not present

## 2019-12-14 DIAGNOSIS — I700 Atherosclerosis of aorta: Secondary | ICD-10-CM | POA: Diagnosis not present

## 2019-12-14 NOTE — Telephone Encounter (Signed)
Caller Marcie Shearon  Call Back # [phone]  Patient states before dawn this morning, patient fell while walking her dog. Patient states she has cardiac issues and wants to know if she should take pain medication or come over to have an xray. Patient states broken ribs possible.

## 2019-12-14 NOTE — Telephone Encounter (Signed)
Spoke with patient. Pt states she fell walking her dog and thinks she may hurt her left ribs. Dr. Etter Sjogren agreed to place x-ray orders.

## 2019-12-17 ENCOUNTER — Telehealth: Payer: Self-pay | Admitting: Interventional Cardiology

## 2019-12-17 NOTE — Telephone Encounter (Signed)
New Message   Pt called in and stated she rec'd a heart monitor in the mail, she has an appt with Dr Irish Lack  On 10/25.  She stated she is having a hard time putting in on and would like to know is she could speak to the nurse to help guide her. Pt also had an accident with her dog and cracked her rib over her heart and she is not sure if she can even wear it right now.    Best number - [phone]

## 2019-12-18 NOTE — Telephone Encounter (Signed)
Reviewed monitor application and instructions with patient.

## 2019-12-19 ENCOUNTER — Ambulatory Visit (INDEPENDENT_AMBULATORY_CARE_PROVIDER_SITE_OTHER): Payer: Medicare HMO

## 2019-12-19 DIAGNOSIS — I10 Essential (primary) hypertension: Secondary | ICD-10-CM | POA: Diagnosis not present

## 2019-12-19 DIAGNOSIS — R002 Palpitations: Secondary | ICD-10-CM | POA: Diagnosis not present

## 2019-12-19 NOTE — Telephone Encounter (Signed)
Left message for patient to call back  

## 2019-12-20 ENCOUNTER — Telehealth: Payer: Self-pay | Admitting: Interventional Cardiology

## 2019-12-20 NOTE — Telephone Encounter (Signed)
Pt returning call from [place], Therapist, sports. She states she just placed her 30 day monitor on and wondered if she needed to reschedule her follow up visit with Dr. Irish Lack.   Forwarding to [facility] for review.

## 2019-12-20 NOTE — Telephone Encounter (Signed)
Patient is returning phone call from Dr. Hassell Done nurse. Please call back

## 2019-12-21 NOTE — Telephone Encounter (Signed)
Left message for patient to call back  

## 2019-12-21 NOTE — Telephone Encounter (Signed)
See telephone encounter from 9/30.

## 2019-12-21 NOTE — Telephone Encounter (Signed)
Patient returning your call. Please callback after 3pm.

## 2019-12-21 NOTE — Telephone Encounter (Signed)
Called and spoke to patient. She has changed her appt with Dr. Irish Lack to 11/15 after her monitor is complete.

## 2020-01-14 ENCOUNTER — Ambulatory Visit: Payer: Medicare HMO | Admitting: Interventional Cardiology

## 2020-02-04 ENCOUNTER — Encounter: Payer: Self-pay | Admitting: Interventional Cardiology

## 2020-02-04 ENCOUNTER — Ambulatory Visit: Payer: Medicare HMO | Admitting: Interventional Cardiology

## 2020-02-04 ENCOUNTER — Other Ambulatory Visit: Payer: Self-pay

## 2020-02-04 VITALS — BP 160/80 | HR 80 | Ht 65.0 in | Wt 156.4 lb

## 2020-02-04 DIAGNOSIS — R002 Palpitations: Secondary | ICD-10-CM

## 2020-02-04 DIAGNOSIS — Z8249 Family history of ischemic heart disease and other diseases of the circulatory system: Secondary | ICD-10-CM

## 2020-02-04 DIAGNOSIS — I700 Atherosclerosis of aorta: Secondary | ICD-10-CM

## 2020-02-04 DIAGNOSIS — I10 Essential (primary) hypertension: Secondary | ICD-10-CM | POA: Diagnosis not present

## 2020-02-04 NOTE — Patient Instructions (Signed)
Medication Instructions:  Your physician recommends that you continue on your current medications as directed. Please refer to the Current Medication list given to you today.  *If you need a refill on your cardiac medications before your next appointment, please call your pharmacy*   Lab Work: None today If you have labs (blood work) drawn today and your tests are completely normal, you will receive your results only by: Marland Kitchen MyChart Message (if you have MyChart) OR . A paper copy in the mail If you have any lab test that is abnormal or we need to change your treatment, we will call you to review the results.   Testing/Procedures: None today   Follow-Up: At [facility], you and your health needs are our priority.  As part of our continuing mission to provide you with exceptional heart care, we have created designated Provider Care Teams.  These Care Teams include your primary Cardiologist (physician) and Advanced Practice Providers (APPs -  Physician Assistants and Nurse Practitioners) who all work together to provide you with the care you need, when you need it.  We recommend signing up for the patient portal called "MyChart".  Sign up information is provided on this After Visit Summary.  MyChart is used to connect with patients for Virtual Visits (Telemedicine).  Patients are able to view lab/test results, encounter notes, upcoming appointments, etc.  Non-urgent messages can be sent to your provider as well.   To learn more about what you can do with MyChart, go to [email or web address].    Your next appointment:   1 year(s)  The format for your next appointment:   In Person  Provider:   Casandra Doffing, MD    High-Fiber Diet Fiber, also called dietary fiber, is a type of carbohydrate that is found in fruits, vegetables, whole grains, and beans. A high-fiber diet can have many health benefits. Your health care provider may recommend a high-fiber diet to help:  Prevent  constipation. Fiber can make your bowel movements more regular.  Lower your cholesterol.  Relieve the following conditions: ? Swelling of veins in the anus (hemorrhoids). ? Swelling and irritation (inflammation) of specific areas of the digestive tract (uncomplicated diverticulosis). ? A problem of the large intestine (colon) that sometimes causes pain and diarrhea (irritable bowel syndrome, IBS).  Prevent overeating as part of a weight-loss plan.  Prevent heart disease, type 2 diabetes, and certain cancers. What is my plan? The recommended daily fiber intake in grams (g) includes:  38 g for men age 58 or younger.  30 g for men over age 64.  25 g for women age 31 or younger.  21 g for women over age 35. You can get the recommended daily intake of dietary fiber by:  Eating a variety of fruits, vegetables, grains, and beans.  Taking a fiber supplement, if it is not possible to get enough fiber through your diet. What do I need to know about a high-fiber diet?  It is better to get fiber through food sources rather than from fiber supplements. There is not a lot of research about how effective supplements are.  Always check the fiber content on the nutrition facts label of any prepackaged food. Look for foods that contain 5 g of fiber or more per serving.  Talk with a diet and nutrition specialist (dietitian) if you have questions about specific foods that are recommended or not recommended for your medical condition, especially if those foods are not listed below.  Gradually increase how  much fiber you consume. If you increase your intake of dietary fiber too quickly, you may have bloating, cramping, or gas.  Drink plenty of water. Water helps you to digest fiber. What are tips for following this plan?  Eat a wide variety of high-fiber foods.  Make sure that half of the grains that you eat each day are whole grains.  Eat breads and cereals that are made with whole-grain flour  instead of refined flour or white flour.  Eat brown rice, bulgur wheat, or millet instead of white rice.  Start the day with a breakfast that is high in fiber, such as a cereal that contains 5 g of fiber or more per serving.  Use beans in place of meat in soups, salads, and pasta dishes.  Eat high-fiber snacks, such as berries, raw vegetables, nuts, and popcorn.  Choose whole fruits and vegetables instead of processed forms like juice or sauce. What foods can I eat?  Fruits Berries. Pears. Apples. Oranges. Avocado. Prunes and raisins. Dried figs. Vegetables Sweet potatoes. Spinach. Kale. Artichokes. Cabbage. Broccoli. Cauliflower. Green peas. Carrots. Squash. Grains Whole-grain breads. Multigrain cereal. Oats and oatmeal. Brown rice. Barley. Bulgur wheat. [place]. Quinoa. Bran muffins. Popcorn. Rye wafer crackers. Meats and other proteins Navy, kidney, and pinto beans. Soybeans. Split peas. Lentils. Nuts and seeds. Dairy Fiber-fortified yogurt. Beverages Fiber-fortified soy milk. Fiber-fortified orange juice. Other foods Fiber bars. The items listed above may not be a complete list of recommended foods and beverages. Contact a dietitian for more options. What foods are not recommended? Fruits Fruit juice. Cooked, strained fruit. Vegetables Fried potatoes. Canned vegetables. Well-cooked vegetables. Grains White bread. Pasta made with refined flour. White rice. Meats and other proteins Fatty cuts of meat. Fried chicken or fried fish. Dairy Milk. Yogurt. Cream cheese. Sour cream. Fats and oils Butters. Beverages Soft drinks. Other foods Cakes and pastries. The items listed above may not be a complete list of foods and beverages to avoid. Contact a dietitian for more information. Summary  Fiber is a type of carbohydrate. It is found in fruits, vegetables, whole grains, and beans.  There are many health benefits of eating a high-fiber diet, such as preventing constipation,  lowering blood cholesterol, helping with weight loss, and reducing your risk of heart disease, diabetes, and certain cancers.  Gradually increase your intake of fiber. Increasing too fast can result in cramping, bloating, and gas. Drink plenty of water while you increase your fiber.  The best sources of fiber include whole fruits and vegetables, whole grains, nuts, seeds, and beans. This information is not intended to replace advice given to you by your health care provider. Make sure you discuss any questions you have with your health care provider. Document Revised: 01/10/2017 Document Reviewed: 01/10/2017 Elsevier Patient Education  2020 [employer].

## 2020-02-04 NOTE — Progress Notes (Signed)
Cardiology Office Note   Date:  02/04/2020   ID:  Kristen, Schultz [birth year], MRN [id number]  PCP:  Carollee Herter, Alferd Apa, DO    No chief complaint on file.  palpitations  Wt Readings from Last 3 Encounters:  02/04/20 156 lb 6.4 oz (70.9 kg)  12/10/19 154 lb 9.6 oz (70.1 kg)  06/07/19 156 lb 9.6 oz (71 kg)       History of Present Illness: Kristen Schultz is a 74 y.o. female who is being seen today for the evaluation of palpitatons at the request of Carollee Herter, Alferd Apa, *.  She has had HTN and palpitations.  She was seen by Dr. Wynonia Lawman in the past.  She has had amylase elevation and this led to HCTZ being stopped.   She has a strong family h/o CAD with early MI in brother and father.    Palpitations-sx last a few seconds.  No syncope.   Denies : Chest pain. Dizziness. Leg edema. Nitroglycerin use. Orthopnea.  Paroxysmal nocturnal dyspnea. Shortness of breath. Syncope.   Treated for H. Pylori.  She walks once a week, for about 4 miles.  Recent monitor showed NSR, PACs and PVCs. No AFib.    Past Medical History:  Diagnosis Date  . Anxiety   . Colon polyps   . Gastric ulcer    per patient; in the 1970s  . Heart murmur   . Hyperlipidemia    no treatment per pt  . Hypertension   . Liver cyst   . Ovarian cyst 1984  . Pneumonia     Past Surgical History:  Procedure Laterality Date  . COLONOSCOPY  02/10/2016  . DENTAL SURGERY  2000   dental implant  . ESOPHAGOGASTRODUODENOSCOPY  11/06/2018   [facility]   . HEMORRHOID SURGERY    . LAPAROSCOPIC LIVER CYST REMOVAL    . OVARIAN CYST SURGERY       Current Outpatient Medications  Medication Sig Dispense Refill  . Cholecalciferol 25 MCG (1000 UT) tablet Take by mouth.    . citalopram (CELEXA) 10 MG tablet Take 1 tablet (10 mg total) by mouth daily. 90 tablet 1  . cyanocobalamin 100 MCG tablet Take by mouth.    . losartan (COZAAR) 50 MG tablet Take 1 tablet (50 mg total) by mouth daily. 90 tablet 1    . Multiple Vitamin (MULTIVITAMIN) capsule Take 1 capsule by mouth daily.     No current facility-administered medications for this visit.    Allergies:   Bee venom, Codeine, Other, Penicillins, Tetracyclines & related, and Xylocaine dental [lidocaine-epinephrine]    Social History:  The patient  reports that she quit smoking about 34 years ago. She has a 4.50 pack-year smoking history. She has never used smokeless tobacco. She reports current alcohol use of about 14.0 standard drinks of alcohol per week. She reports that she does not use drugs.   Family History:  The patient's family history includes Dementia in her sister; Heart disease in her brother and father; Heart disease (age of onset: 51) in her brother.    ROS:  Please see the history of present illness.   Otherwise, review of systems are positive for stress .   All other systems are reviewed and negative.    PHYSICAL EXAM: VS:  BP (!) 160/80   Pulse 80   Ht 5\' 5"  (1.651 m)   Wt 156 lb 6.4 oz (70.9 kg)   SpO2 99%   BMI 26.03 kg/m  ,  BMI Body mass index is 26.03 kg/m. GEN: Well nourished, well developed, in no acute distress  HEENT: normal  Neck: no JVD, carotid bruits, or masses Cardiac: *RRR; no murmurs, rubs, or gallops,no edema  Respiratory:  clear to auscultation bilaterally, normal work of breathing GI: soft, nontender, nondistended, + BS MS: no deformity or atrophy  Skin: warm and dry, no rash Neuro:  Strength and sensation are intact Psych: euthymic mood, full affect   EKG:   The ekg ordered 11/2019 demonstrates NSR, nonspecific ST changes   Recent Labs: 12/10/2019: ALT 10; BUN 14; Creat 0.64; Potassium 5.0; Sodium 139; TSH 1.45   Lipid Panel    Component Value Date/Time   CHOL 225 (H) 12/10/2019 1021   TRIG 54 12/10/2019 1021   HDL 90 12/10/2019 1021   CHOLHDL 2.5 12/10/2019 1021   VLDL 15.6 06/07/2019 0901   LDLCALC 120 (H) 12/10/2019 1021     Other studies Reviewed: Additional studies/  records that were reviewed today with results demonstrating: labs reviewed.   ASSESSMENT AND PLAN:  1. Palpitations: Recent monitor reviewed.  She has a lot of stress from caring for her sister.   2. Aortic atherosclerosis: Consider statin given her family h/o CAD and visualized atherosclerosis. Consider rosuvastatin 10 mg daily.  She will discuss with Dr. Etter Sjogren.   3. HTN: Continue ARB.  Check BP at home.  Increase exercise.  Low salt det. May need ARB increased if BP stays high.  4. Family h/o early CAD: strong family h/o with Dad and brother. If she has any sx of chest discomfort, DOE, change in exercise tolerance, would set up for CTA coronaries.  5. Normal LV function, and valvular function by recent echo.    Current medicines are reviewed at length with the patient today.  The patient concerns regarding her medicines were addressed.  The following changes have been made:  No change  Labs/ tests ordered today include:  No orders of the defined types were placed in this encounter.   Recommend 150 minutes/week of aerobic exercise Low fat, low carb, high fiber diet recommended  Disposition:   FU in 1 year   Signed, Larae Grooms, MD  02/04/2020 11:30 AM    [facility] [address], [place], [place]  [address] Phone: [phone]; Fax: [phone]

## 2020-02-08 ENCOUNTER — Encounter: Payer: Self-pay | Admitting: Gastroenterology

## 2020-02-08 ENCOUNTER — Ambulatory Visit: Payer: Medicare HMO | Admitting: Gastroenterology

## 2020-02-08 VITALS — BP 150/84 | HR 72 | Ht 65.0 in | Wt 154.1 lb

## 2020-02-08 DIAGNOSIS — A048 Other specified bacterial intestinal infections: Secondary | ICD-10-CM | POA: Diagnosis not present

## 2020-02-08 DIAGNOSIS — K219 Gastro-esophageal reflux disease without esophagitis: Secondary | ICD-10-CM

## 2020-02-08 DIAGNOSIS — R748 Abnormal levels of other serum enzymes: Secondary | ICD-10-CM | POA: Diagnosis not present

## 2020-02-08 DIAGNOSIS — K449 Diaphragmatic hernia without obstruction or gangrene: Secondary | ICD-10-CM

## 2020-02-08 NOTE — Progress Notes (Signed)
Chief Complaint:   Referring Provider:  Carollee Herter, Alferd Apa, *      ASSESSMENT AND PLAN;   #1. Gastric polyps (Bx hyperplastic) with intestinal metaplasia and positive H. Pylori 07/23/2019.  Treated with triple drug therapy. CT abdo/pelvis 03/2018 with gastric wall thickening.   #2. GERD with small transient HH.  #3. 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 Nl Lipase. Neg CT, [place] 03/2018 for pancreatic abn. Neg salivary amylase. No abdo pain. Off HCTZ.  Neg EUS 07/23/2019 (Dr Delrae Alfred - report awaited)  #4. IBS-D   Plan: - HP stool antigen (she is off PPIs) to confirm eradication. - CT Abdo/pelvis report given to the patient.  She wanted to get the actual films.  She will go downstairs and see if they can give her a CD. - FU in 1 yr. - Recall colonoscopy 01/2021 for FU of colonic polyps. - Consider recall EGD with gastric mapping 07/2022.  Note there is no FH of gastric malignancy.   HPI:    Kristen Schultz is a 74 y.o. female  Doing well   3-4/day BM, after waking, lower abdo pinching [place] 2 [place].  No GI complaints.  No abdominal pain.  Has been eating well. No nausea, vomiting, heartburn, regurgitation, odynophagia or dysphagia.  No significant diarrhea or constipation.  There is no melena or hematochezia. No unintentional weight loss.  No family history of gastric malignancy.   Wt Readings from Last 3 Encounters:  02/08/20 154 lb 2 oz (69.9 kg)  02/04/20 156 lb 6.4 oz (70.9 kg)  12/10/19 154 lb 9.6 oz (70.1 kg)     Past GI procedures: -Colonoscopy 02/10/2016-4 mm polyp status post polypectomy, internal hemorrhoids, left colonic diverticulosis. Bx- TA. Rpt in 5 yrs. -EGD 07/23/2019 by Dr. Delrae Alfred with EUS. I do not see the actual operative note.  I did see bx-hyperplastic polyps with intestinal metaplasia.  Gastric mapping biopsies showed chronic gastritis with intestinal metaplasia.  Suspicious for  H. Pylori. 08/18/2018 Gastric polyps, small HH, atrophic gastritis. Bx- HGD polyps (mainly antral but sent in same bottle), gastric metaplasia, neg SB Bx for celiac. -CT abdo/pel 04/10/2018   -Sigmoid diverticulosis with muscular hypertrophy.  -Small hiatal hernia  -Mild gastric wall thickening.  -Liver scar due to previous cyst removal.  No cirrhosis on review.  -Aortic atherosclerosis.  -DJD   Past Medical History:  Diagnosis Date  . Anxiety   . Colon polyps   . Gastric ulcer    per patient; in the 1970s  . H. pylori infection   . Heart murmur   . Hyperlipidemia    no treatment per pt  . Hypertension   . Liver cyst   . Ovarian cyst 1984  . Pneumonia     Past Surgical History:  Procedure Laterality Date  . COLONOSCOPY  02/10/2016  . DENTAL SURGERY  2000   dental implant  . DENTAL SURGERY  2021   2 molars taken out   . ESOPHAGOGASTRODUODENOSCOPY  11/06/2018   [facility]   . ESOPHAGOGASTRODUODENOSCOPY  07/23/2019   [facility]   . EUS  07/23/2019   [facility]   . HEMORRHOID SURGERY    . LAPAROSCOPIC LIVER CYST REMOVAL    . OVARIAN CYST SURGERY      Family History  Problem Relation Age of Onset  . Heart disease Father   . Heart disease Brother 85  . Heart  disease Brother   . Dementia Sister   . Glaucoma Neg Hx   . Colon cancer Neg Hx   . Esophageal cancer Neg Hx   . Pancreatic cancer Neg Hx   . Stomach cancer Neg Hx   . Rectal cancer Neg Hx   . Colon polyps Neg Hx     Social History   Tobacco Use  . Smoking status: Former Smoker    Packs/day: 0.30    Years: 15.00    Pack years: 4.50    Quit date: 11/17/1985    Years since quitting: 34.2  . Smokeless tobacco: Never Used  Vaping Use  . Vaping Use: Never used  Substance Use Topics  . Alcohol use: Yes    Alcohol/week: 14.0 standard drinks    Types: 14 Glasses of wine per week    Comment: 2 glasses per night  . Drug use: No    Current Outpatient Medications  Medication Sig  Dispense Refill  . Cholecalciferol (VITAMIN D-3) 125 MCG (5000 UT) TABS Take 1 tablet by mouth daily.    . citalopram (CELEXA) 10 MG tablet Take 1 tablet (10 mg total) by mouth daily. 90 tablet 1  . cyanocobalamin 100 MCG tablet Take by mouth.    . losartan (COZAAR) 50 MG tablet Take 1 tablet (50 mg total) by mouth daily. 90 tablet 1  . Multiple Vitamin (MULTIVITAMIN) capsule Take 1 capsule by mouth daily.     No current facility-administered medications for this visit.    Allergies  Allergen Reactions  . Bee Venom Anaphylaxis    Bee stings  . Codeine     Extreme Vomiting  . Other Other (See Comments)    CDN - reaction unknown  . Penicillins     Redness, swelling and itching Can take Keflex  . Tetracyclines & Related     Yeast Infection and Stomach problems  . Xylocaine Dental [Lidocaine-Epinephrine]     Review of Systems:  neg     Physical Exam:    BP (!) 150/84   Pulse 72   Ht 5\' 5"  (1.651 m)   Wt 154 lb 2 oz (69.9 kg)   BMI 25.65 kg/m  Filed Weights   02/08/20 0833  Weight: 154 lb 2 oz (69.9 kg)   Gen: awake, alert, NAD HEENT: anicteric, no pallor CV: RRR, no mrg Pulm: CTA b/l Abd: soft, NT/ND, +BS throughout Ext: no c/c/e Neuro: nonfocal   CBC: CBC Latest Ref Rng & Units 06/29/2018 05/18/2018 04/03/2018  WBC 3.4 - 10.8 x10E3/uL 7.0 5.9 6.6  Hemoglobin 11.1 - 15.9 g/dL 13.6 15.9(H) 14.7  Hematocrit 34.0 - 46.6 % 41.5 46.8(H) 43.6  Platelets 150 - 450 x10E3/uL 286 304.0 282.0    CMP: CMP Latest Ref Rng & Units 12/10/2019 06/07/2019 11/28/2018  Glucose 65 - 99 mg/dL 87 81 86  BUN 7 - 25 mg/dL 14 12 12   Creatinine 0.60 - 0.93 mg/dL 0.64 0.66 0.64  Sodium 135 - 146 mmol/L 139 138 139  Potassium 3.5 - 5.3 mmol/L 5.0 4.5 5.0  Chloride 98 - 110 mmol/L 103 101 102  CO2 20 - 32 mmol/L 31 30 29   Calcium 8.6 - 10.4 mg/dL 9.6 9.1 9.3  Total Protein 6.1 - 8.1 g/dL 6.7 7.0 6.7  Total Bilirubin 0.2 - 1.2 mg/dL 0.7 0.5 0.8  Alkaline Phos 39 - 117 U/L - 70 54  AST  10 - 35 U/L 18 21 18   ALT 6 - 29 U/L 10 14 12  Amylase    Component Value Date/Time   AMYLASE 482 (H) 12/10/2019 1021   Lipase     Component Value Date/Time   LIPASE 29 06/29/2018 Osino, MD 02/08/2020, 8:59 AM  Cc: Carollee Herter, Alferd Apa, *

## 2020-02-08 NOTE — Patient Instructions (Signed)
Your provider has requested that you go to the basement level for lab work before leaving today. Press "B" on the elevator. The lab is located at the first door on the left as you exit the elevator.  Please follow up in one year  

## 2020-02-11 ENCOUNTER — Other Ambulatory Visit: Payer: Medicare HMO

## 2020-02-11 DIAGNOSIS — A048 Other specified bacterial intestinal infections: Secondary | ICD-10-CM | POA: Diagnosis not present

## 2020-02-12 LAB — HELICOBACTER PYLORI  SPECIAL ANTIGEN
MICRO NUMBER:: 11233078
SPECIMEN QUALITY: ADEQUATE

## 2020-02-15 ENCOUNTER — Other Ambulatory Visit: Payer: Self-pay

## 2020-02-15 ENCOUNTER — Ambulatory Visit (HOSPITAL_COMMUNITY)
Admission: EM | Admit: 2020-02-15 | Discharge: 2020-02-15 | Disposition: A | Discharge status: Home / Self Care | Payer: Medicare HMO | Attending: Physician Assistant | Admitting: Physician Assistant

## 2020-02-15 ENCOUNTER — Encounter (HOSPITAL_COMMUNITY): Payer: Self-pay

## 2020-02-15 DIAGNOSIS — N390 Urinary tract infection, site not specified: Secondary | ICD-10-CM | POA: Insufficient documentation

## 2020-02-15 DIAGNOSIS — R350 Frequency of micturition: Secondary | ICD-10-CM | POA: Diagnosis not present

## 2020-02-15 LAB — POCT URINALYSIS DIPSTICK, ED / UC
Bilirubin Urine: NEGATIVE
Glucose, UA: NEGATIVE mg/dL
Ketones, ur: NEGATIVE mg/dL
Nitrite: NEGATIVE
Protein, ur: NEGATIVE mg/dL
Specific Gravity, Urine: 1.015 (ref 1.005–1.030)
Urobilinogen, UA: 0.2 mg/dL (ref 0.0–1.0)
pH: 7.5 (ref 5.0–8.0)

## 2020-02-15 MED ORDER — SULFAMETHOXAZOLE-TRIMETHOPRIM 800-160 MG PO TABS: 1.0000 | ORAL_TABLET | Freq: Two times a day (BID) | ORAL | 0 refills | Status: AC

## 2020-02-15 NOTE — Discharge Instructions (Addendum)
Take medication as prescribed Push fluids  Lab results pending

## 2020-02-15 NOTE — ED Triage Notes (Addendum)
Pt presents with increased urinary frequency, discomfort when  urinating  x 3 days; right lower quadrant abdominal pain since this morning . Denies fever, chills, nausea.

## 2020-02-15 NOTE — ED Provider Notes (Signed)
[name]    CSN: [id number] Arrival date & time: 02/15/20  1013      History   Chief Complaint Chief Complaint  Patient presents with   Urinary Frequency   Abdominal Pain    HPI Kristen Schultz is a 74 y.o. female.   Pt complains of increased urinary frequency, dysuria that started three days ago.  Pt reports lower abdominal discomfort.  Denies fever, chills, n/v, flank pain.  She has taken nothing for the sx.  Reports h/o recurrent UTIs with today's sx feeling very similar to previous UTI.     Past Medical History:  Diagnosis Date   Anxiety    Colon polyps    Gastric ulcer    per patient; in the 9s   H. pylori infection    Heart murmur    Hyperlipidemia    no treatment per pt   Hypertension    Liver cyst    Ovarian cyst 1984   Pneumonia     Patient Active Problem List   Diagnosis Date Noted   B12 deficiency 12/10/2019   Palpitation 12/10/2019   Polyp of colon 12/10/2019   Lower abdominal pain 12/10/2019   Adjustment disorder with mixed anxiety and depressed mood 12/10/2019   Bradycardia 12/10/2019   Elevated amylase 05/24/2018   Gastroesophageal reflux disease 05/24/2018   Preventative health care 05/24/2018   Elevated rheumatoid factor 05/24/2018   Right shoulder pain 10/06/2015   Generalized anxiety disorder 11/11/2014   Essential hypertension 11/11/2014    Past Surgical History:  Procedure Laterality Date   COLONOSCOPY  02/10/2016   DENTAL SURGERY  2000   dental implant   DENTAL SURGERY  2021   2 molars taken out    ESOPHAGOGASTRODUODENOSCOPY  11/06/2018   [facility]    ESOPHAGOGASTRODUODENOSCOPY  07/23/2019   [place]    EUS  07/23/2019   [place]     LAPAROSCOPIC LIVER CYST REMOVAL     OVARIAN CYST SURGERY      OB History   No obstetric history on file.      Home Medications    Prior to Admission medications   Medication  Sig Start Date End Date Taking? Authorizing Provider  Cholecalciferol (VITAMIN D-3) 125 MCG (5000 UT) TABS Take 1 tablet by mouth daily.    [provider]  citalopram (CELEXA) 10 MG tablet Take 1 tablet (10 mg total) by mouth daily. 12/10/19   [name], DO  cyanocobalamin 100 MCG tablet Take by mouth.    [provider]  losartan (COZAAR) 50 MG tablet Take 1 tablet (50 mg total) by mouth daily. 12/10/19   [name], DO  Multiple Vitamin (MULTIVITAMIN) capsule Take 1 capsule by mouth daily.    [provider]    Family History Family History  Problem Relation Age of Onset   Heart disease Father    Heart disease Brother 87   Heart disease Brother    Dementia Sister    Glaucoma Neg Hx    Colon cancer Neg Hx    Esophageal cancer Neg Hx    Pancreatic cancer Neg Hx    Stomach cancer Neg Hx    Rectal cancer Neg Hx    Colon polyps Neg Hx     Social History Social History   Tobacco Use   Smoking status: Former Smoker    Packs/day: 0.30    Years: 15.00  Pack years: 4.50    Quit date: 11/17/1985    Years since quitting: 34.2   Smokeless tobacco: Never Used  Vaping Use   Vaping Use: Never used  Substance Use Topics   Alcohol use: Yes    Alcohol/week: 14.0 standard drinks    Types: 14 Glasses of wine per week    Comment: 2 glasses per night   Drug use: No     Allergies   Bee venom, Codeine, Other, Penicillins, Tetracyclines & related, and Xylocaine dental [lidocaine-epinephrine]   Review of Systems Review of Systems  Constitutional: Negative for chills and fever.  HENT: Negative for ear pain and sore throat.   Eyes: Negative for pain and visual disturbance.  Respiratory: Negative for cough and shortness of breath.   Cardiovascular: Negative for chest pain and palpitations.  Gastrointestinal: Negative for abdominal pain, diarrhea, nausea and vomiting.  Genitourinary: Positive for dysuria, frequency and  urgency. Negative for hematuria.  Musculoskeletal: Negative for arthralgias and back pain.  Skin: Negative for color change and rash.  Neurological: Negative for seizures and syncope.  All other systems reviewed and are negative.    Physical Exam Triage Vital Signs ED Triage Vitals  Enc Vitals Group     BP 02/15/20 1210 (!) 169/85     Pulse Rate 02/15/20 1210 69     Resp 02/15/20 1210 18     Temp 02/15/20 1210 98.3 F (36.8 C)     Temp Source 02/15/20 1210 Oral     SpO2 02/15/20 1210 98 %     Weight --      Height --      Head Circumference --      Peak Flow --      Pain Score 02/15/20 1207 3     Pain Loc --      Pain Edu? --      Excl. in [place]? --    No data found.  Updated Vital Signs BP (!) 169/85 (BP Location: Left Arm)    Pulse 69    Temp 98.3 F (36.8 C) (Oral)    Resp 18    SpO2 98%   Visual Acuity Right Eye Distance:   Left Eye Distance:   Bilateral Distance:    Right Eye Near:   Left Eye Near:    Bilateral Near:     Physical Exam Vitals and nursing note reviewed.  Constitutional:      General: She is not in acute distress.    Appearance: She is well-developed.  HENT:     Head: Normocephalic and atraumatic.  Eyes:     Conjunctiva/sclera: Conjunctivae normal.  Cardiovascular:     Rate and Rhythm: Normal rate and regular rhythm.     Heart sounds: No murmur heard.   Pulmonary:     Effort: Pulmonary effort is normal. No respiratory distress.     Breath sounds: Normal breath sounds.  Abdominal:     General: Bowel sounds are normal.     Palpations: Abdomen is soft.     Tenderness: There is no abdominal tenderness. There is no right CVA tenderness or left CVA tenderness.  Musculoskeletal:     Cervical back: Neck supple.  Skin:    General: Skin is warm and dry.  Neurological:     Mental Status: She is alert.      UC Treatments / Results  Labs (all labs ordered are listed, but only abnormal results are displayed) Labs Reviewed  POCT URINALYSIS  DIPSTICK, ED / UC  EKG   Radiology No results found.  Procedures Procedures (including critical care time)  Medications Ordered in UC Medications - No data to display  Initial Impression / Assessment and Plan / UC Course  I have reviewed the triage vital signs and the nursing notes.  Pertinent labs & imaging results that were available during my care of the patient were reviewed by me and considered in my medical decision making (see chart for details).     Will treat for UTI, send out urine culture. Pt advised to follow up with PCP. Push fluids.  Final Clinical Impressions(s) / UC Diagnoses   Final diagnoses:  None   Discharge Instructions   None    ED Prescriptions    None     PDMP not reviewed this encounter.   Konrad Felix, PA-C 02/15/20 1236

## 2020-02-16 ENCOUNTER — Telehealth: Payer: Self-pay

## 2020-02-16 LAB — URINE CULTURE

## 2020-02-16 NOTE — Telephone Encounter (Signed)
Pt called health team on Friday stating she is having urine urgency

## 2020-02-18 NOTE — Telephone Encounter (Signed)
Pt went UC on Friday. FYI

## 2020-02-19 ENCOUNTER — Ambulatory Visit (HOSPITAL_COMMUNITY)
Admission: RE | Admit: 2020-02-19 | Discharge: 2020-02-19 | Disposition: A | Discharge status: Home / Self Care | Payer: Medicare HMO | Source: Ambulatory Visit | Attending: Family Medicine | Admitting: Family Medicine

## 2020-02-19 ENCOUNTER — Other Ambulatory Visit: Payer: Self-pay

## 2020-02-19 NOTE — ED Notes (Signed)
Kristen Schultz spoke with pt and states she is feeling better. ABX completed. Will cancel appt today and will call back for another appt if symptoms return.

## 2020-02-19 NOTE — ED Triage Notes (Signed)
Was told to return due to urine contamination. Was given bactrim for 3 days and has been completed. Primary asked her to return to UC to follow up.

## 2020-03-25 ENCOUNTER — Ambulatory Visit
Admission: RE | Admit: 2020-03-25 | Discharge: 2020-03-25 | Disposition: A | Discharge status: Home / Self Care | Payer: Medicare HMO | Source: Ambulatory Visit | Attending: Family Medicine | Admitting: Family Medicine

## 2020-03-25 ENCOUNTER — Other Ambulatory Visit: Payer: Self-pay

## 2020-03-25 DIAGNOSIS — E2839 Other primary ovarian failure: Secondary | ICD-10-CM

## 2020-03-25 DIAGNOSIS — Z1231 Encounter for screening mammogram for malignant neoplasm of breast: Secondary | ICD-10-CM

## 2020-03-25 DIAGNOSIS — Z780 Asymptomatic menopausal state: Secondary | ICD-10-CM | POA: Diagnosis not present

## 2020-03-25 DIAGNOSIS — M85851 Other specified disorders of bone density and structure, right thigh: Secondary | ICD-10-CM | POA: Diagnosis not present

## 2020-04-03 ENCOUNTER — Ambulatory Visit (INDEPENDENT_AMBULATORY_CARE_PROVIDER_SITE_OTHER): Payer: Medicare HMO | Admitting: Family Medicine

## 2020-04-03 ENCOUNTER — Encounter: Payer: Self-pay | Admitting: Family Medicine

## 2020-04-03 ENCOUNTER — Other Ambulatory Visit: Payer: Self-pay

## 2020-04-03 VITALS — BP 132/80 | HR 75 | Temp 97.7°F | Resp 18 | Ht 65.0 in | Wt 153.4 lb

## 2020-04-03 DIAGNOSIS — R1030 Lower abdominal pain, unspecified: Secondary | ICD-10-CM | POA: Diagnosis not present

## 2020-04-03 DIAGNOSIS — R81 Glycosuria: Secondary | ICD-10-CM

## 2020-04-03 LAB — POC URINALSYSI DIPSTICK (AUTOMATED)
Bilirubin, UA: NEGATIVE
Blood, UA: NEGATIVE
Glucose, UA: POSITIVE — AB
Ketones, UA: NEGATIVE
Leukocytes, UA: NEGATIVE
Nitrite, UA: NEGATIVE
Protein, UA: POSITIVE — AB
Spec Grav, UA: 1.02 (ref 1.010–1.025)
Urobilinogen, UA: 0.2 E.U./dL
pH, UA: 7 (ref 5.0–8.0)

## 2020-04-03 NOTE — Progress Notes (Signed)
he

## 2020-04-03 NOTE — Patient Instructions (Signed)
Flank Pain, Adult Flank pain is pain that is located on the side of the body between the upper abdomen and the back. This area is called the flank. The pain may occur over a short period of time (acute), or it may be long-term or recurring (chronic). It may be mild or severe. Flank pain can be caused by many things, including:  Muscle soreness or injury.  Kidney stones or kidney disease.  Stress.  A disease of the spine (vertebral disk disease).  A lung infection (pneumonia).  Fluid around the lungs (pulmonary edema).  A skin rash caused by the chickenpox virus (shingles).  Tumors that affect the back of the abdomen.  Gallbladder disease. Follow these instructions at home:  Drink enough fluid to keep your urine clear or pale yellow.  Rest as told by your health care provider.  Take over-the-counter and prescription medicines only as told by your health care provider.  Keep a journal to track what has caused your flank pain and what has made it feel better.  Keep all follow-up visits as told by your health care provider. This is important.   Contact a health care provider if:  Your pain is not controlled with medicine.  You have new symptoms.  Your pain gets worse.  You have a fever.  Your symptoms last longer than 2-3 days.  You have trouble urinating or you are urinating very frequently. Get help right away if:  You have trouble breathing or you are short of breath.  Your abdomen hurts or it is swollen or red.  You have nausea or vomiting.  You feel faint or you pass out.  You have blood in your urine. Summary  Flank pain is pain that is located on the side of the body between the upper abdomen and the back.  The pain may occur over a short period of time (acute), or it may be long-term or recurring (chronic). It may be mild or severe.  Flank pain can be caused by many things.  Contact your health care provider if your symptoms get worse or they last  longer than 2-3 days. This information is not intended to replace advice given to you by your health care provider. Make sure you discuss any questions you have with your health care provider. Document Revised: 11/30/2019 Document Reviewed: 11/30/2019 Elsevier Patient Education  2021 Elsevier Inc.  

## 2020-04-04 LAB — COMPREHENSIVE METABOLIC PANEL
ALT: 12 U/L (ref 0–35)
AST: 19 U/L (ref 0–37)
Albumin: 4.3 g/dL (ref 3.5–5.2)
Alkaline Phosphatase: 58 U/L (ref 39–117)
BUN: 20 mg/dL (ref 6–23)
CO2: 27 mEq/L (ref 19–32)
Calcium: 9.4 mg/dL (ref 8.4–10.5)
Chloride: 103 mEq/L (ref 96–112)
Creatinine, Ser: 0.81 mg/dL (ref 0.40–1.20)
GFR: 71.68 mL/min (ref >60.00)
Glucose, Bld: 76 mg/dL (ref 70–99)
Potassium: 4.6 mEq/L (ref 3.5–5.1)
Sodium: 139 mEq/L (ref 135–145)
Total Bilirubin: 0.5 mg/dL (ref 0.2–1.2)
Total Protein: 6.8 g/dL (ref 6.0–8.3)

## 2020-04-04 LAB — CBC WITH DIFFERENTIAL/PLATELET
Basophils Absolute: 0.1 10*3/uL (ref 0.0–0.1)
Basophils Relative: 1.1 % (ref 0.0–3.0)
Eosinophils Absolute: 0.1 10*3/uL (ref 0.0–0.7)
Eosinophils Relative: 1.1 % (ref 0.0–5.0)
HCT: 43.6 % (ref 36.0–46.0)
Hemoglobin: 14.5 g/dL (ref 12.0–15.0)
Lymphocytes Relative: 16.3 % (ref 12.0–46.0)
Lymphs Abs: 1.2 10*3/uL (ref 0.7–4.0)
MCHC: 33.2 g/dL (ref 30.0–36.0)
MCV: 98.3 fl (ref 78.0–100.0)
Monocytes Absolute: 0.9 10*3/uL (ref 0.1–1.0)
Monocytes Relative: 12 % (ref 3.0–12.0)
Neutro Abs: 5.3 10*3/uL (ref 1.4–7.7)
Neutrophils Relative %: 69.5 % (ref 43.0–77.0)
Platelets: 276 10*3/uL (ref 150.0–400.0)
RBC: 4.44 Mil/uL (ref 3.87–5.11)
RDW: 13.6 % (ref 11.5–15.5)
WBC: 7.6 10*3/uL (ref 4.0–10.5)

## 2020-04-04 LAB — HEMOGLOBIN A1C: Hgb A1c MFr Bld: 5.4 % (ref 4.6–6.5)

## 2020-04-06 ENCOUNTER — Encounter: Payer: Self-pay | Admitting: Family Medicine

## 2020-04-07 ENCOUNTER — Other Ambulatory Visit: Payer: Self-pay | Admitting: Family Medicine

## 2020-04-07 DIAGNOSIS — R81 Glycosuria: Secondary | ICD-10-CM

## 2020-04-07 NOTE — Assessment & Plan Note (Signed)
Check labs and [place] abd If pain worsens -- go to ER

## 2020-04-07 NOTE — Progress Notes (Signed)
Patient ID: Kristen Schultz, female    DOB: 17-Aug-1945  Age: 75 y.o. MRN: [id number]    Subjective:  Subjective  HPI [facility] presents for R side pain x 3 weeks ,  + frequent stools and diarrhea with bloating  No fevers   Review of Systems  Constitutional: Negative for appetite change, diaphoresis, fatigue and unexpected weight change.  Eyes: Negative for pain, redness and visual disturbance.  Respiratory: Negative for cough, chest tightness, shortness of breath and wheezing.   Cardiovascular: Negative for chest pain, palpitations and leg swelling.  Gastrointestinal: Positive for abdominal distention, abdominal pain and diarrhea. Negative for anal bleeding, blood in stool, constipation, nausea, rectal pain and vomiting.  Endocrine: Negative for cold intolerance, heat intolerance, polydipsia, polyphagia and polyuria.  Genitourinary: Negative for difficulty urinating, dysuria and frequency.  Neurological: Negative for dizziness, light-headedness, numbness and headaches.    History Past Medical History:  Diagnosis Date  . Anxiety   . Colon polyps   . Gastric ulcer    per patient; in the 1970s  . H. pylori infection   . Heart murmur   . Hyperlipidemia    no treatment per pt  . Hypertension   . Liver cyst   . Ovarian cyst 1984  . Pneumonia     She has a past surgical history that includes Hemorrhoid surgery; Laparoscopic liver cyst removal; Ovarian cyst surgery; Dental surgery (2000); Colonoscopy (02/10/2016); Esophagogastroduodenoscopy (11/06/2018); Esophagogastroduodenoscopy (07/23/2019); EUS (07/23/2019); and Dental surgery (2021).   Her family history includes Dementia in her sister; Heart disease in her brother and father; Heart disease (age of onset: 29) in her brother.She reports that she quit smoking about 34 years ago. She has a 4.50 pack-year smoking history. She has never used smokeless tobacco. She reports current alcohol use of about 14.0 standard drinks of  alcohol per week. She reports that she does not use drugs.  Current Outpatient Medications on File Prior to Visit  Medication Sig Dispense Refill  . Cholecalciferol (VITAMIN D-3) 125 MCG (5000 UT) TABS Take 1 tablet by mouth daily.    . citalopram (CELEXA) 10 MG tablet Take 1 tablet (10 mg total) by mouth daily. 90 tablet 1  . cyanocobalamin 100 MCG tablet Take by mouth.    . losartan (COZAAR) 50 MG tablet Take 1 tablet (50 mg total) by mouth daily. 90 tablet 1  . Multiple Vitamin (MULTIVITAMIN) capsule Take 1 capsule by mouth daily.     No current facility-administered medications on file prior to visit.     Objective:  Objective  Physical Exam Vitals and nursing note reviewed.  Constitutional:      Appearance: She is well-developed and well-nourished.  HENT:     Head: Normocephalic and atraumatic.  Eyes:     Extraocular Movements: EOM normal.     Conjunctiva/sclera: Conjunctivae normal.  Neck:     Thyroid: No thyromegaly.     Vascular: No carotid bruit or JVD.  Cardiovascular:     Rate and Rhythm: Normal rate and regular rhythm.     Heart sounds: Normal heart sounds. No murmur heard.   Pulmonary:     Effort: Pulmonary effort is normal. No respiratory distress.     Breath sounds: Normal breath sounds. No wheezing or rales.  Chest:     Chest wall: No tenderness.  Abdominal:     Tenderness: There is abdominal tenderness in the right lower quadrant and suprapubic area. There is no right CVA tenderness, left CVA tenderness, guarding or  rebound. Negative signs include Murphy's sign and McBurney's sign.  Musculoskeletal:        General: No edema.     Cervical back: Normal range of motion and neck supple.  Neurological:     Mental Status: She is alert and oriented to person, place, and time.  Psychiatric:        Mood and Affect: Mood and affect normal.    BP 132/80 (BP Location: Right Arm, Patient Position: Sitting, Cuff Size: Normal)   Pulse 75   Temp 97.7 F (36.5 C)  (Oral)   Resp 18   Ht 5\' 5"  (1.651 m)   Wt 153 lb 6.4 oz (69.6 kg)   SpO2 99%   BMI 25.53 kg/m  Wt Readings from Last 3 Encounters:  04/03/20 153 lb 6.4 oz (69.6 kg)  02/08/20 154 lb 2 oz (69.9 kg)  02/04/20 156 lb 6.4 oz (70.9 kg)     Lab Results  Component Value Date   WBC 7.6 04/03/2020   HGB 14.5 04/03/2020   HCT 43.6 04/03/2020   PLT 276.0 04/03/2020   GLUCOSE 76 04/03/2020   CHOL 225 (H) 12/10/2019   TRIG 54 12/10/2019   HDL 90 12/10/2019   LDLCALC 120 (H) 12/10/2019   ALT 12 04/03/2020   AST 19 04/03/2020   NA 139 04/03/2020   K 4.6 04/03/2020   CL 103 04/03/2020   CREATININE 0.81 04/03/2020   BUN 20 04/03/2020   CO2 27 04/03/2020   TSH 1.45 12/10/2019   HGBA1C 5.4 04/03/2020   MICROALBUR <0.7 05/01/2018    DG Bone Density  Result Date: 03/25/2020 EXAM: DUAL X-RAY ABSORPTIOMETRY (DXA) FOR BONE MINERAL DENSITY IMPRESSION: Referring Physician:  Rosalita Chessman CHASE Your patient completed a BMD test using Lunar IDXA DXA system ( analysis version: 16 ) manufactured by [employer]. Technologist: KT PATIENT: Name: Kristen Schultz, Kristen Schultz Patient ID: [id number] Birth Date: 07-06-1945 Height: 65.0 in. Sex: Female Measured: 03/25/2020 Weight: 152.6 lbs. Indications: Advanced Age, Caucasian, Celexa, Estrogen Deficient, History of Fracture (Adult) (V15.51), History of Osteopenia, Postmenopausal, Secondary Osteoporosis Fractures: Rib, vertebrae Treatments: Calcium (E943.0), Vitamin D (E933.5) ASSESSMENT: The BMD measured at Femur Neck Right is 0.743 g/cm2 with a T-score of -2.1. This patient is considered osteopenic according to [facility]) criteria. The scan quality is good. L-1, L-4 were excluded due to degenerative changes. Site Region Measured Date Measured Age YA BMD Significant CHANGE T-score DualFemur Neck Right 03/25/2020 74.0 -2.1 0.743 g/cm2 * DualFemur Neck Right 01/09/2018 71.8 -1.8 0.792 g/cm2 * AP Spine  L2-L3      3/25/2020    74.0         -1.0    1.078  g/cm2 AP Spine  L2-L3      01/09/2018    71.8         -1.4    1.042 g/cm2 DualFemur Total Mean 03/25/2020    74.0         -1.4    0.831 g/cm2 DualFemur Total Mean 01/09/2018 71.8 -1.3 0.846 g/cm2 * World Health Organization [facility]) criteria for post-menopausal, Caucasian Women: Normal       T-score at or above -1 SD Osteopenia   T-score between -1 and -2.5 SD Osteoporosis T-score at or below -2.5 SD RECOMMENDATION: 1. All patients should optimize calcium and vitamin D intake. 2. Consider FDA approved medical therapies in postmenopausal women and men aged 22 years and older, based on the following: a. A hip or vertebral (clinical or morphometric)  fracture b. T- score < or = -2.5 at the femoral neck or spine after appropriate evaluation to exclude secondary causes c. Low bone mass (T-score between -1.0 and -2.5 at the femoral neck or spine) and a 10 year probability of a hip fracture > or = 3% or a 10 year probability of a major osteoporosis-related fracture > or = 20% based on the [place]-adapted WHO algorithm d. Clinician judgment and/or patient preferences may indicate treatment for people with 10-year fracture probabilities above or below these levels FOLLOW-UP: Patients with diagnosis of osteoporosis or at high risk for fracture should have regular bone mineral density tests. For patients eligible for Medicare, routine testing is allowed once every 2 years. The testing frequency can be increased to one year for patients who have rapidly progressing disease, those who are receiving or discontinuing medical therapy to restore bone mass, or have additional risk factors. I have reviewed this report and agree with the above findings. [facility] FRAX* 10-year Probability of Fracture Based on femoral neck BMD: DualFemur (Right) Major Osteoporotic Fracture: 21.1% Hip Fracture:                5.2% Population:                  [place] (Caucasian) Risk Factors: History of Fracture (Adult) (V15.51), Secondary Osteoporosis  *FRAX is a Materials engineer of the [employer] for Metabolic Bone Disease, a World Pharmacologist (WHO) [facility]. ASSESSMENT: The probability of a major osteoporotic fracture is 21.1% within the next ten years. The probability of a hip fracture is 5.2 % within the next ten years. Electronically Signed   By: Lowella Grip III M.D.   On: 03/25/2020 10:46   MM 3D SCREEN BREAST BILATERAL  Result Date: 03/25/2020 CLINICAL DATA:  Screening. EXAM: DIGITAL SCREENING BILATERAL MAMMOGRAM WITH TOMO AND CAD COMPARISON:  Previous exam(s). ACR Breast Density Category b: There are scattered areas of fibroglandular density. FINDINGS: There are no findings suspicious for malignancy. Images were processed with CAD. IMPRESSION: No mammographic evidence of malignancy. A result letter of this screening mammogram will be mailed directly to the patient. RECOMMENDATION: Screening mammogram in one year. (Code:[id number]-B-01Y) BI-RADS CATEGORY  1: Negative. Electronically Signed   By: Everlean Alstrom M.D.   On: 03/25/2020 10:00     Assessment & Plan:  Plan  I am having Kristen Schultz maintain her multivitamin, losartan, citalopram, cyanocobalamin, and Vitamin D-3.  No orders of the defined types were placed in this encounter.   Problem List Items Addressed This Visit      Unprioritized   Lower abdominal pain - Primary    Check labs and [place] abd If pain worsens -- go to ER       Relevant Orders   US Pelvic Complete With Transvaginal   US Abdomen Complete   Comprehensive metabolic panel (Completed)   CBC with Differential/Platelet (Completed)   POCT Urinalysis Dipstick (Automated) (Completed)    Other Visit Diagnoses    Glucosuria       Relevant Orders   Hemoglobin A1c (Completed)      Follow-up: Return if symptoms worsen or fail to improve.  [name], DO

## 2020-04-10 ENCOUNTER — Ambulatory Visit (HOSPITAL_BASED_OUTPATIENT_CLINIC_OR_DEPARTMENT_OTHER)
Admission: RE | Admit: 2020-04-10 | Discharge: 2020-04-10 | Disposition: A | Discharge status: Home / Self Care | Payer: Medicare HMO | Source: Ambulatory Visit | Attending: Family Medicine | Admitting: Family Medicine

## 2020-04-10 ENCOUNTER — Other Ambulatory Visit (INDEPENDENT_AMBULATORY_CARE_PROVIDER_SITE_OTHER): Payer: Medicare HMO

## 2020-04-10 ENCOUNTER — Other Ambulatory Visit: Payer: Self-pay

## 2020-04-10 DIAGNOSIS — R1030 Lower abdominal pain, unspecified: Secondary | ICD-10-CM | POA: Diagnosis not present

## 2020-04-10 DIAGNOSIS — R81 Glycosuria: Secondary | ICD-10-CM | POA: Diagnosis not present

## 2020-04-10 DIAGNOSIS — R82998 Other abnormal findings in urine: Secondary | ICD-10-CM

## 2020-04-10 DIAGNOSIS — D259 Leiomyoma of uterus, unspecified: Secondary | ICD-10-CM | POA: Diagnosis not present

## 2020-04-10 DIAGNOSIS — N854 Malposition of uterus: Secondary | ICD-10-CM | POA: Diagnosis not present

## 2020-04-10 DIAGNOSIS — I700 Atherosclerosis of aorta: Secondary | ICD-10-CM | POA: Diagnosis not present

## 2020-04-10 LAB — POC URINALSYSI DIPSTICK (AUTOMATED)
Bilirubin, UA: NEGATIVE
Blood, UA: NEGATIVE
Glucose, UA: NEGATIVE
Ketones, UA: NEGATIVE
Nitrite, UA: NEGATIVE
Protein, UA: NEGATIVE
Spec Grav, UA: 1.015 (ref 1.010–1.025)
Urobilinogen, UA: 0.2 E.U./dL
pH, UA: 7.5 (ref 5.0–8.0)

## 2020-04-10 LAB — BASIC METABOLIC PANEL
BUN: 11 mg/dL (ref 6–23)
CO2: 30 mEq/L (ref 19–32)
Calcium: 9.3 mg/dL (ref 8.4–10.5)
Chloride: 103 mEq/L (ref 96–112)
Creatinine, Ser: 0.74 mg/dL (ref 0.40–1.20)
GFR: 79.89 mL/min (ref >60.00)
Glucose, Bld: 78 mg/dL (ref 70–99)
Potassium: 5 mEq/L (ref 3.5–5.1)
Sodium: 139 mEq/L (ref 135–145)

## 2020-04-10 LAB — HEMOGLOBIN A1C: Hgb A1c MFr Bld: 5.4 % (ref 4.6–6.5)

## 2020-04-10 NOTE — Addendum Note (Signed)
Addended by: Kelle Darting A on: 04/10/2020 11:58 AM   Modules accepted: Orders

## 2020-04-11 ENCOUNTER — Encounter: Payer: Self-pay | Admitting: Family Medicine

## 2020-04-11 LAB — URINE CULTURE
MICRO NUMBER:: 11438414
SPECIMEN QUALITY:: ADEQUATE

## 2020-06-10 ENCOUNTER — Encounter: Payer: Medicare HMO | Admitting: Family Medicine

## 2020-06-12 ENCOUNTER — Other Ambulatory Visit: Payer: Self-pay

## 2020-06-13 ENCOUNTER — Encounter: Payer: Self-pay | Admitting: Family Medicine

## 2020-06-13 ENCOUNTER — Ambulatory Visit (INDEPENDENT_AMBULATORY_CARE_PROVIDER_SITE_OTHER): Payer: Medicare HMO | Admitting: Family Medicine

## 2020-06-13 VITALS — BP 110/80 | HR 60 | Temp 98.4°F | Resp 16 | Ht 65.0 in | Wt 152.6 lb

## 2020-06-13 DIAGNOSIS — F4323 Adjustment disorder with mixed anxiety and depressed mood: Secondary | ICD-10-CM | POA: Diagnosis not present

## 2020-06-13 DIAGNOSIS — I10 Essential (primary) hypertension: Secondary | ICD-10-CM | POA: Diagnosis not present

## 2020-06-13 DIAGNOSIS — R1013 Epigastric pain: Secondary | ICD-10-CM

## 2020-06-13 DIAGNOSIS — F411 Generalized anxiety disorder: Secondary | ICD-10-CM

## 2020-06-13 DIAGNOSIS — Z0000 Encounter for general adult medical examination without abnormal findings: Secondary | ICD-10-CM | POA: Diagnosis not present

## 2020-06-13 DIAGNOSIS — R7989 Other specified abnormal findings of blood chemistry: Secondary | ICD-10-CM | POA: Diagnosis not present

## 2020-06-13 DIAGNOSIS — K219 Gastro-esophageal reflux disease without esophagitis: Secondary | ICD-10-CM | POA: Diagnosis not present

## 2020-06-13 DIAGNOSIS — E538 Deficiency of other specified B group vitamins: Secondary | ICD-10-CM

## 2020-06-13 DIAGNOSIS — R69 Illness, unspecified: Secondary | ICD-10-CM | POA: Diagnosis not present

## 2020-06-13 DIAGNOSIS — R748 Abnormal levels of other serum enzymes: Secondary | ICD-10-CM

## 2020-06-13 LAB — LIPID PANEL
Cholesterol: 229 mg/dL — ABNORMAL HIGH (ref 0–200)
HDL: 88.2 mg/dL (ref >39.00)
LDL Cholesterol: 130 mg/dL — ABNORMAL HIGH (ref 0–99)
NonHDL: 140.45
Total CHOL/HDL Ratio: 3
Triglycerides: 51 mg/dL (ref 0.0–149.0)
VLDL: 10.2 mg/dL (ref 0.0–40.0)

## 2020-06-13 LAB — CBC WITH DIFFERENTIAL/PLATELET
Basophils Absolute: 0.1 10*3/uL (ref 0.0–0.1)
Basophils Relative: 0.9 % (ref 0.0–3.0)
Eosinophils Absolute: 0.1 10*3/uL (ref 0.0–0.7)
Eosinophils Relative: 2 % (ref 0.0–5.0)
HCT: 42.6 % (ref 36.0–46.0)
Hemoglobin: 14.4 g/dL (ref 12.0–15.0)
Lymphocytes Relative: 17 % (ref 12.0–46.0)
Lymphs Abs: 1.1 10*3/uL (ref 0.7–4.0)
MCHC: 33.8 g/dL (ref 30.0–36.0)
MCV: 96.9 fl (ref 78.0–100.0)
Monocytes Absolute: 0.7 10*3/uL (ref 0.1–1.0)
Monocytes Relative: 10.3 % (ref 3.0–12.0)
Neutro Abs: 4.5 10*3/uL (ref 1.4–7.7)
Neutrophils Relative %: 69.8 % (ref 43.0–77.0)
Platelets: 245 10*3/uL (ref 150.0–400.0)
RBC: 4.39 Mil/uL (ref 3.87–5.11)
RDW: 13.3 % (ref 11.5–15.5)
WBC: 6.4 10*3/uL (ref 4.0–10.5)

## 2020-06-13 LAB — COMPREHENSIVE METABOLIC PANEL
ALT: 15 U/L (ref 0–35)
AST: 21 U/L (ref 0–37)
Albumin: 4.3 g/dL (ref 3.5–5.2)
Alkaline Phosphatase: 60 U/L (ref 39–117)
BUN: 17 mg/dL (ref 6–23)
CO2: 28 mEq/L (ref 19–32)
Calcium: 9.3 mg/dL (ref 8.4–10.5)
Chloride: 102 mEq/L (ref 96–112)
Creatinine, Ser: 0.69 mg/dL (ref 0.40–1.20)
GFR: 85.59 mL/min (ref >60.00)
Glucose, Bld: 75 mg/dL (ref 70–99)
Potassium: 4.7 mEq/L (ref 3.5–5.1)
Sodium: 139 mEq/L (ref 135–145)
Total Bilirubin: 0.6 mg/dL (ref 0.2–1.2)
Total Protein: 6.9 g/dL (ref 6.0–8.3)

## 2020-06-13 LAB — VITAMIN B12: Vitamin B-12: 363 pg/mL (ref 211–911)

## 2020-06-13 LAB — AMYLASE: Amylase: 384 U/L — ABNORMAL HIGH (ref 27–131)

## 2020-06-13 MED ORDER — FAMOTIDINE 20 MG PO TABS: 20.0000 mg | ORAL_TABLET | Freq: Two times a day (BID) | ORAL | 5 refills | Status: DC

## 2020-06-13 MED ORDER — LOSARTAN POTASSIUM 50 MG PO TABS: 50.0000 mg | ORAL_TABLET | Freq: Every day | ORAL | 1 refills | Status: DC

## 2020-06-13 MED ORDER — CITALOPRAM HYDROBROMIDE 10 MG PO TABS: 10.0000 mg | ORAL_TABLET | Freq: Every day | ORAL | 1 refills | Status: DC

## 2020-06-13 NOTE — Assessment & Plan Note (Signed)
ghm utd Check labs  

## 2020-06-13 NOTE — Progress Notes (Signed)
Subjective:     Kristen Schultz is a 75 y.o. female and is here for a comprehensive physical exam. The patient reports no new problems .  Social History   Socioeconomic History  . Marital status: Married    Spouse name: [name]  . Number of children: 1  . Years of education: Not on file  . Highest education level: Not on file  Occupational History  . Occupation: retired    Comment: retired  Tobacco Use  . Smoking status: Former Smoker    Packs/day: 0.30    Years: 15.00    Pack years: 4.50    Quit date: 11/17/1985    Years since quitting: 34.5  . Smokeless tobacco: Never Used  Vaping Use  . Vaping Use: Never used  Substance and Sexual Activity  . Alcohol use: Yes    Alcohol/week: 14.0 standard drinks    Types: 14 Glasses of wine per week    Comment: 2 glasses per night  . Drug use: No  . Sexual activity: Not Currently    Partners: Male  Other Topics Concern  . Not on file  Social History Narrative   Exercise- Walks about 15-22 miles per week with womens group.   Social Determinants of Health   Financial Resource Strain: Not on file  Food Insecurity: Not on file  Transportation Needs: Not on file  Physical Activity: Not on file  Stress: Not on file  Social Connections: Not on file  Intimate Partner Violence: Not on file   Health Maintenance  Topic Date Due  . COLONOSCOPY (Pts 45-84yrs Insurance coverage will need to be confirmed)  02/09/2021  . MAMMOGRAM  03/25/2021  . DEXA SCAN  03/25/2022  . TETANUS/TDAP  07/04/2029  . INFLUENZA VACCINE  Completed  . COVID-19 Vaccine  Completed  . Hepatitis C Screening  Completed  . PNA vac Low Risk Adult  Completed  . HPV VACCINES  Aged Out    The following portions of the patient's history were reviewed and updated as appropriate:  She  has a past medical history of Anxiety, Colon polyps, Gastric ulcer, H. pylori infection, Heart murmur, Hyperlipidemia, Hypertension, Liver cyst, Ovarian cyst (1984), and Pneumonia. She  does not have any pertinent problems on file. She  has a past surgical history that includes Hemorrhoid surgery; Laparoscopic liver cyst removal; Ovarian cyst surgery; Dental surgery (2000); Colonoscopy (02/10/2016); Esophagogastroduodenoscopy (11/06/2018); Esophagogastroduodenoscopy (07/23/2019); EUS (07/23/2019); and Dental surgery (2021). Her family history includes Dementia in her sister; Heart disease in her brother and father; Heart disease (age of onset: 46) in her brother. She  reports that she quit smoking about 34 years ago. She has a 4.50 pack-year smoking history. She has never used smokeless tobacco. She reports current alcohol use of about 14.0 standard drinks of alcohol per week. She reports that she does not use drugs. She has a current medication list which includes the following prescription(s): vitamin d-3, cyanocobalamin, famotidine, multivitamin, citalopram, and losartan. Current Outpatient Medications on File Prior to Visit  Medication Sig Dispense Refill  . Cholecalciferol (VITAMIN D-3) 125 MCG (5000 UT) TABS Take 1 tablet by mouth daily.    . cyanocobalamin 100 MCG tablet Take by mouth.    . Multiple Vitamin (MULTIVITAMIN) capsule Take 1 capsule by mouth daily.     No current facility-administered medications on file prior to visit.   She is allergic to bee venom, codeine, other, penicillins, tetracyclines & related, and xylocaine dental [lidocaine-epinephrine]..  Review of Systems Review of Systems  Constitutional: Negative for activity change, appetite change and fatigue.  HENT: Negative for hearing loss, congestion, tinnitus and ear discharge.  dentist q55m Eyes: Negative for visual disturbance (see optho q1y -- vision corrected to 20/20 with glasses).  Respiratory: Negative for cough, chest tightness and shortness of breath.   Cardiovascular: Negative for chest pain, palpitations and leg swelling.  Gastrointestinal: Negative for abdominal pain, diarrhea, constipation  and abdominal distention.  Genitourinary: Negative for urgency, frequency, decreased urine volume and difficulty urinating.  Musculoskeletal: Negative for back pain, arthralgias and gait problem.  Skin: Negative for color change, pallor and rash.  Neurological: Negative for dizziness, light-headedness, numbness and headaches.  Hematological: Negative for adenopathy. Does not bruise/bleed easily.  Psychiatric/Behavioral: Negative for suicidal ideas, confusion, sleep disturbance, self-injury, dysphoric mood, decreased concentration and agitation.       Objective:    BP 110/80 (BP Location: Right Arm, Patient Position: Sitting, Cuff Size: Normal)   Pulse 60   Temp 98.4 F (36.9 C) (Oral)   Resp 16   Ht 5\' 5"  (1.651 m)   Wt 152 lb 9.6 oz (69.2 kg)   SpO2 97%   BMI 25.39 kg/m  General appearance: alert, cooperative, appears stated age and no distress Head: Normocephalic, without obvious abnormality, atraumatic Eyes: conjunctivae/corneas clear. PERRL, EOM's intact. Fundi benign. Ears: normal TM's and external ear canals both ears Neck: no adenopathy, no carotid bruit, no JVD, supple, symmetrical, trachea midline and thyroid not enlarged, symmetric, no tenderness/mass/nodules Back: symmetric, no curvature. ROM normal. No CVA tenderness. Lungs: clear to auscultation bilaterally Heart: regular rate and rhythm, S1, S2 normal, no murmur, click, rub or gallop  Abdomen: soft, non-tender; bowel sounds normal; no masses,  no organomegaly Pelvic: not indicated; post-menopausal, no abnormal Pap smears in past Extremities: extremities normal, atraumatic, no cyanosis or edema Pulses: 2+ and symmetric Skin: Skin color, texture, turgor normal. No rashes or lesions Lymph nodes: Cervical, supraclavicular, and axillary nodes normal. Neurologic: Alert and oriented X 3, normal strength and tone. Normal symmetric reflexes. Normal coordination and gait    Assessment:    Healthy female exam.      Plan:     ghm utd Check laBS See After Visit Summary for Counseling Recommendations    1. Adjustment disorder with mixed anxiety and depressed mood Stable con't meds - citalopram (CELEXA) 10 MG tablet; Take 1 tablet (10 mg total) by mouth daily.  Dispense: 90 tablet; Refill: 1  2. Essential hypertension Well controlled, no changes to meds. Encouraged heart healthy diet such as the DASH diet and exercise as tolerated.  - losartan (COZAAR) 50 MG tablet; Take 1 tablet (50 mg total) by mouth daily.  Dispense: 90 tablet; Refill: 1 - Lipid panel - Comprehensive metabolic panel - CBC with Differential/Platelet  3. Vitamin B12 deficiency \ - Vitamin B12  4. High serum amylase Recheck today - Amylase  5. Dyspepsia pepcid bid and f/u gi - famotidine (PEPCID) 20 MG tablet; Take 1 tablet (20 mg total) by mouth 2 (two) times daily.  Dispense: 60 tablet; Refill: 5  6. Preventative health care ghm utd Check labs

## 2020-06-13 NOTE — Assessment & Plan Note (Signed)
Well controlled, no changes to meds. Encouraged heart healthy diet such as the DASH diet and exercise as tolerated.  °

## 2020-06-13 NOTE — Patient Instructions (Signed)
Preventive Care 75 Years and Older, Female Preventive care refers to lifestyle choices and visits with your health care provider that can promote health and wellness. This includes:  A yearly physical exam. This is also called an annual wellness visit.  Regular dental and eye exams.  Immunizations.  Screening for certain conditions.  Healthy lifestyle choices, such as: ? Eating a healthy diet. ? Getting regular exercise. ? Not using drugs or products that contain nicotine and tobacco. ? Limiting alcohol use. What can I expect for my preventive care visit? Physical exam Your health care provider will check your:  Height and weight. These may be used to calculate your BMI (body mass index). BMI is a measurement that tells if you are at a healthy weight.  Heart rate and blood pressure.  Body temperature.  Skin for abnormal spots. Counseling Your health care provider may ask you questions about your:  Past medical problems.  Family's medical history.  Alcohol, tobacco, and drug use.  Emotional well-being.  Home life and relationship well-being.  Sexual activity.  Diet, exercise, and sleep habits.  History of falls.  Memory and ability to understand (cognition).  Work and work Statistician.  Pregnancy and menstrual history.  Access to firearms. What immunizations do I need? Vaccines are usually given at various ages, according to a schedule. Your health care provider will recommend vaccines for you based on your age, medical history, and lifestyle or other factors, such as travel or where you work.   What tests do I need? Blood tests  Lipid and cholesterol levels. These may be checked every 5 years, or more often depending on your overall health.  Hepatitis C test.  Hepatitis B test. Screening  Lung cancer screening. You may have this screening every year starting at age 75 if you have a 30-pack-year history of smoking and currently smoke or have quit within  the past 15 years.  Colorectal cancer screening. ? All adults should have this screening starting at age 44 and continuing until age 75. ? Your health care provider may recommend screening at age 75 if you are at increased risk. ? You will have tests every 1-10 years, depending on your results and the type of screening test.  Diabetes screening. ? This is done by checking your blood sugar (glucose) after you have not eaten for a while (fasting). ? You may have this done every 1-3 years.  Mammogram. ? This may be done every 1-2 years. ? Talk with your health care provider about how often you should have regular mammograms.  Abdominal aortic aneurysm (AAA) screening. You may need this if you are a current or former smoker.  BRCA-related cancer screening. This may be done if you have a family history of breast, ovarian, tubal, or peritoneal cancers. Other tests  STD (sexually transmitted disease) testing, if you are at risk.  Bone density scan. This is done to screen for osteoporosis. You may have this done starting at age 75. Talk with your health care provider about your test results, treatment options, and if necessary, the need for more tests. Follow these instructions at home: Eating and drinking  Eat a diet that includes fresh fruits and vegetables, whole grains, lean protein, and low-fat dairy products. Limit your intake of foods with high amounts of sugar, saturated fats, and salt.  Take vitamin and mineral supplements as recommended by your health care provider.  Do not drink alcohol if your health care provider tells you not to drink.  If you drink alcohol: ? Limit how much you have to 0-1 drink a day. ? Be aware of how much alcohol is in your drink. In the [place] one drink equals one 12 oz bottle of beer (355 mL), one 5 oz glass of wine (148 mL), or one 1 oz glass of hard liquor (44 mL).   Lifestyle  Take daily care of your teeth and gums. Brush your teeth every morning  and night with fluoride toothpaste. Floss one time each day.  Stay active. Exercise for at least 30 minutes 5 or more days each week.  Do not use any products that contain nicotine or tobacco, such as cigarettes, e-cigarettes, and chewing tobacco. If you need help quitting, ask your health care provider.  Do not use drugs.  If you are sexually active, practice safe sex. Use a condom or other form of protection in order to prevent STIs (sexually transmitted infections).  Talk with your health care provider about taking a low-dose aspirin or statin.  Find healthy ways to cope with stress, such as: ? Meditation, yoga, or listening to music. ? Journaling. ? Talking to a trusted person. ? Spending time with friends and family. Safety  Always wear your seat belt while driving or riding in a vehicle.  Do not drive: ? If you have been drinking alcohol. Do not ride with someone who has been drinking. ? When you are tired or distracted. ? While texting.  Wear a helmet and other protective equipment during sports activities.  If you have firearms in your house, make sure you follow all gun safety procedures. What's next?  Visit your health care provider once a year for an annual wellness visit.  Ask your health care provider how often you should have your eyes and teeth checked.  Stay up to date on all vaccines. This information is not intended to replace advice given to you by your health care provider. Make sure you discuss any questions you have with your health care provider. Document Revised: 02/27/2020 Document Reviewed: 03/02/2018 Elsevier Patient Education  2021 Elsevier Inc.  

## 2020-06-13 NOTE — Assessment & Plan Note (Signed)
Per gi  

## 2020-06-13 NOTE — Assessment & Plan Note (Signed)
Stable con't meds 

## 2020-06-13 NOTE — Assessment & Plan Note (Signed)
Recheck today. 

## 2020-06-18 ENCOUNTER — Other Ambulatory Visit: Payer: Self-pay | Admitting: Family Medicine

## 2020-06-18 DIAGNOSIS — E785 Hyperlipidemia, unspecified: Secondary | ICD-10-CM

## 2020-06-19 ENCOUNTER — Encounter: Payer: Self-pay | Admitting: Family Medicine

## 2020-06-19 NOTE — Telephone Encounter (Signed)
We can recheck in 2 months

## 2020-08-12 ENCOUNTER — Other Ambulatory Visit: Payer: Self-pay

## 2020-08-12 ENCOUNTER — Other Ambulatory Visit (INDEPENDENT_AMBULATORY_CARE_PROVIDER_SITE_OTHER): Payer: Medicare HMO

## 2020-08-12 DIAGNOSIS — E785 Hyperlipidemia, unspecified: Secondary | ICD-10-CM | POA: Diagnosis not present

## 2020-08-12 LAB — COMPREHENSIVE METABOLIC PANEL
ALT: 12 U/L (ref 0–35)
AST: 19 U/L (ref 0–37)
Albumin: 4.1 g/dL (ref 3.5–5.2)
Alkaline Phosphatase: 54 U/L (ref 39–117)
BUN: 13 mg/dL (ref 6–23)
CO2: 29 mEq/L (ref 19–32)
Calcium: 9.5 mg/dL (ref 8.4–10.5)
Chloride: 103 mEq/L (ref 96–112)
Creatinine, Ser: 0.66 mg/dL (ref 0.40–1.20)
GFR: 86.41 mL/min (ref >60.00)
Glucose, Bld: 84 mg/dL (ref 70–99)
Potassium: 4.8 mEq/L (ref 3.5–5.1)
Sodium: 141 mEq/L (ref 135–145)
Total Bilirubin: 0.9 mg/dL (ref 0.2–1.2)
Total Protein: 6.8 g/dL (ref 6.0–8.3)

## 2020-08-12 LAB — LIPID PANEL
Cholesterol: 204 mg/dL — ABNORMAL HIGH (ref 0–200)
HDL: 83.8 mg/dL (ref >39.00)
LDL Cholesterol: 107 mg/dL — ABNORMAL HIGH (ref 0–99)
NonHDL: 119.85
Total CHOL/HDL Ratio: 2
Triglycerides: 63 mg/dL (ref 0.0–149.0)
VLDL: 12.6 mg/dL (ref 0.0–40.0)

## 2020-09-19 ENCOUNTER — Other Ambulatory Visit: Payer: Medicare HMO

## 2020-11-14 ENCOUNTER — Encounter: Payer: Self-pay | Admitting: Family

## 2020-11-14 ENCOUNTER — Other Ambulatory Visit: Payer: Self-pay

## 2020-11-14 ENCOUNTER — Telehealth (INDEPENDENT_AMBULATORY_CARE_PROVIDER_SITE_OTHER): Payer: Medicare HMO | Admitting: Family

## 2020-11-14 VITALS — Temp 97.3°F | Ht 65.0 in | Wt 139.0 lb

## 2020-11-14 DIAGNOSIS — L0390 Cellulitis, unspecified: Secondary | ICD-10-CM | POA: Diagnosis not present

## 2020-11-14 MED ORDER — SULFAMETHOXAZOLE-TRIMETHOPRIM 800-160 MG PO TABS: 1.0000 | ORAL_TABLET | Freq: Two times a day (BID) | ORAL | 0 refills | Status: DC

## 2020-11-14 MED ORDER — MUPIROCIN 2 % EX OINT: 1.0000 "application " | TOPICAL_OINTMENT | Freq: Two times a day (BID) | CUTANEOUS | 0 refills | Status: DC

## 2020-11-14 NOTE — Progress Notes (Signed)
Kristen Schultz is a 75 y.o. female with the following history as recorded in [employer]:  Patient Active Problem List   Diagnosis Date Noted   B12 deficiency 12/10/2019   Palpitation 12/10/2019   Polyp of colon 12/10/2019   Lower abdominal pain 12/10/2019   Adjustment disorder with mixed anxiety and depressed mood 12/10/2019   Bradycardia 12/10/2019   Elevated amylase 05/24/2018   Gastroesophageal reflux disease 05/24/2018   Preventative health care 05/24/2018   Elevated rheumatoid factor 05/24/2018   Right shoulder pain 10/06/2015   Generalized anxiety disorder 11/11/2014   Essential hypertension 11/11/2014    Current Outpatient Medications  Medication Sig Dispense Refill   Cholecalciferol (VITAMIN D-3) 125 MCG (5000 UT) TABS Take 1 tablet by mouth daily.     citalopram (CELEXA) 10 MG tablet Take 1 tablet (10 mg total) by mouth daily. 90 tablet 1   cyanocobalamin 100 MCG tablet Take by mouth.     famotidine (PEPCID) 20 MG tablet Take 1 tablet (20 mg total) by mouth 2 (two) times daily. 60 tablet 5   losartan (COZAAR) 50 MG tablet Take 1 tablet (50 mg total) by mouth daily. 90 tablet 1   Multiple Vitamin (MULTIVITAMIN) capsule Take 1 capsule by mouth daily.     mupirocin ointment (BACTROBAN) 2 % Apply 1 application topically 2 (two) times daily. 22 g 0   sulfamethoxazole-trimethoprim (BACTRIM DS) 800-160 MG tablet Take 1 tablet by mouth 2 (two) times daily. 14 tablet 0   No current facility-administered medications for this visit.    Allergies: Bee venom, Codeine, Other, Penicillins, Tetracyclines & related, and Xylocaine dental [lidocaine-epinephrine]  Past Medical History:  Diagnosis Date   Anxiety    Colon polyps    Gastric ulcer    per patient; in the 1970s   H. pylori infection    Heart murmur    Hyperlipidemia    no treatment per pt   Hypertension    Liver cyst    Ovarian cyst 1984   Pneumonia     Past Surgical History:  Procedure Laterality Date    COLONOSCOPY  02/10/2016   DENTAL SURGERY  2000   dental implant   DENTAL SURGERY  2021   2 molars taken out    ESOPHAGOGASTRODUODENOSCOPY  11/06/2018   [facility]    ESOPHAGOGASTRODUODENOSCOPY  07/23/2019   [facility]    EUS  07/23/2019   [place] [facility]     LAPAROSCOPIC LIVER CYST REMOVAL     OVARIAN CYST SURGERY      Family History  Problem Relation Age of Onset   Heart disease Father    Heart disease Brother 31   Heart disease Brother    Dementia Sister    Glaucoma Neg Hx    Colon cancer Neg Hx    Esophageal cancer Neg Hx    Pancreatic cancer Neg Hx    Stomach cancer Neg Hx    Rectal cancer Neg Hx    Colon polyps Neg Hx     Social History   Tobacco Use   Smoking status: Former    Packs/day: 0.30    Years: 15.00    Pack years: 4.50    Types: Cigarettes    Quit date: 11/17/1985    Years since quitting: 35.0   Smokeless tobacco: Never  Substance Use Topics   Alcohol use: Yes    Alcohol/week: 14.0 standard drinks    Types: 14 Glasses of wine per week  Comment: 2 glasses per night    Subjective:     I connected with Kristen Schultz on 11/14/20 at  9:20 AM EDT by a video enabled telemedicine application and verified that I am speaking with the correct person using two identifiers.   I discussed the limitations of evaluation and management by telemedicine and the availability of in person appointments. The patient expressed understanding and agreed to proceed. Provider in office/ patient is at home; provider and patient are only 2 people on video call.   Patient is recovering from [place] which is reason for virtual visit; however, is concerned about skin infection at right side of chin secondary to ingrown hair; Notes that area is actually improved today but area is warm to touch, red and swollen; can feel right sided lymph node involvement;     Objective:  Vitals:   11/14/20 0935  Temp: (!) 97.3 F (36.3 C)   TempSrc: Temporal  Weight: 139 lb (63 kg)  Height: '5\' 5"'$  (1.651 m)    General: Well developed, well nourished, in no acute distress  Skin : Warm and dry. Scabbed area with erythema noted over right side of chin Head: Normocephalic and atraumatic  Lungs: Respirations unlabored;  Neurologic: Alert and oriented; speech intact; face symmetrical;   Assessment:  1. Cellulitis, unspecified cellulitis site     Plan:  Rx for Bactrim and Bactroban; discussed warm, moist compresses; strict ER precautions discussed; will need to be seen in office next week if symptoms persist.  No follow-ups on file.  No orders of the defined types were placed in this encounter.   Requested Prescriptions   Signed Prescriptions Disp Refills   sulfamethoxazole-trimethoprim (BACTRIM DS) 800-160 MG tablet 14 tablet 0    Sig: Take 1 tablet by mouth 2 (two) times daily.   mupirocin ointment (BACTROBAN) 2 % 22 g 0    Sig: Apply 1 application topically 2 (two) times daily.

## 2020-11-17 ENCOUNTER — Encounter: Payer: Self-pay | Admitting: Family Medicine

## 2020-11-17 ENCOUNTER — Telehealth (INDEPENDENT_AMBULATORY_CARE_PROVIDER_SITE_OTHER): Payer: Medicare HMO | Admitting: Family Medicine

## 2020-11-17 DIAGNOSIS — L03211 Cellulitis of face: Secondary | ICD-10-CM | POA: Insufficient documentation

## 2020-11-17 DIAGNOSIS — U071 COVID-19: Secondary | ICD-10-CM | POA: Diagnosis not present

## 2020-11-17 HISTORY — DX: COVID-19: U07.1

## 2020-11-17 HISTORY — DX: Cellulitis of face: L03.211

## 2020-11-17 NOTE — Assessment & Plan Note (Signed)
covid + on 8/18----  Too late for antiviral  Symptoms are resolving

## 2020-11-17 NOTE — Progress Notes (Signed)
Established Patient Office Visit  Subjective:  Patient ID: Kristen Schultz, female    DOB: 01-12-1946  Age: 75 y.o. MRN: [id number]  CC: No chief complaint on file.   HPI Kristen Schultz presents for f/u cellulitis of chin and she is recovering from covid  she has 3 days of abx left.   Past Medical History:  Diagnosis Date  . Anxiety   . Colon polyps   . Gastric ulcer    per patient; in the 1970s  . H. pylori infection   . Heart murmur   . Hyperlipidemia    no treatment per pt  . Hypertension   . Liver cyst   . Ovarian cyst 1984  . Pneumonia     Past Surgical History:  Procedure Laterality Date  . COLONOSCOPY  02/10/2016  . DENTAL SURGERY  2000   dental implant  . DENTAL SURGERY  2021   2 molars taken out   . ESOPHAGOGASTRODUODENOSCOPY  11/06/2018   [facility]   . ESOPHAGOGASTRODUODENOSCOPY  07/23/2019   [place]   . EUS  07/23/2019   [place]   . HEMORRHOID SURGERY    . LAPAROSCOPIC LIVER CYST REMOVAL    . OVARIAN CYST SURGERY      Family History  Problem Relation Age of Onset  . Heart disease Father   . Heart disease Brother 21  . Heart disease Brother   . Dementia Sister   . Glaucoma Neg Hx   . Colon cancer Neg Hx   . Esophageal cancer Neg Hx   . Pancreatic cancer Neg Hx   . Stomach cancer Neg Hx   . Rectal cancer Neg Hx   . Colon polyps Neg Hx     Social History   Socioeconomic History  . Marital status: Married    Spouse name: [name]  . Number of children: 1  . Years of education: Not on file  . Highest education level: Not on file  Occupational History  . Occupation: retired    Comment: retired  Tobacco Use  . Smoking status: Former    Packs/day: 0.30    Years: 15.00    Pack years: 4.50    Types: Cigarettes    Quit date: 11/17/1985    Years since quitting: 35.0  . Smokeless tobacco: Never  Vaping Use  . Vaping Use: Never used  Substance and Sexual Activity  . Alcohol use: Yes    Alcohol/week:  14.0 standard drinks    Types: 14 Glasses of wine per week    Comment: 2 glasses per night  . Drug use: No  . Sexual activity: Not Currently    Partners: Male  Other Topics Concern  . Not on file  Social History Narrative   Exercise- Walks about 15-22 miles per week with womens group.   Social Determinants of Health   Financial Resource Strain: Not on file  Food Insecurity: Not on file  Transportation Needs: Not on file  Physical Activity: Not on file  Stress: Not on file  Social Connections: Not on file  Intimate Partner Violence: Not on file    Outpatient Medications Prior to Visit  Medication Sig Dispense Refill  . Cholecalciferol (VITAMIN D-3) 125 MCG (5000 UT) TABS Take 1 tablet by mouth daily.    . citalopram (CELEXA) 10 MG tablet Take 1 tablet (10 mg total) by mouth daily. 90 tablet 1  . cyanocobalamin 100 MCG tablet Take by mouth.    Marland Kitchen  famotidine (PEPCID) 20 MG tablet Take 1 tablet (20 mg total) by mouth 2 (two) times daily. 60 tablet 5  . losartan (COZAAR) 50 MG tablet Take 1 tablet (50 mg total) by mouth daily. 90 tablet 1  . Multiple Vitamin (MULTIVITAMIN) capsule Take 1 capsule by mouth daily.    . mupirocin ointment (BACTROBAN) 2 % Apply 1 application topically 2 (two) times daily. 22 g 0  . sulfamethoxazole-trimethoprim (BACTRIM DS) 800-160 MG tablet Take 1 tablet by mouth 2 (two) times daily. 14 tablet 0   No facility-administered medications prior to visit.    Allergies  Allergen Reactions  . Bee Venom Anaphylaxis    Bee stings  . Codeine     Extreme Vomiting  . Other Other (See Comments)    CDN - reaction unknown  . Penicillins     Redness, swelling and itching Can take Keflex  . Tetracyclines & Related     Yeast Infection and Stomach problems  . Xylocaine Dental [Lidocaine-Epinephrine]     ROS Review of Systems  Constitutional:  Negative for appetite change, diaphoresis, fatigue and unexpected weight change.  Eyes:  Negative for pain, redness  and visual disturbance.  Respiratory:  Negative for cough, chest tightness, shortness of breath and wheezing.   Cardiovascular:  Negative for chest pain, palpitations and leg swelling.  Endocrine: Negative for cold intolerance, heat intolerance, polydipsia, polyphagia and polyuria.  Genitourinary:  Negative for difficulty urinating, dysuria and frequency.  Skin:  Positive for wound.  Neurological:  Negative for dizziness, light-headedness, numbness and headaches.     Objective:    Physical Exam Vitals and nursing note reviewed.  Constitutional:      Appearance: She is well-developed.  Eyes:     Conjunctiva/sclera: Conjunctivae normal.  Neck:     Thyroid: No thyromegaly.     Vascular: No JVD.  Pulmonary:     Effort: Pulmonary effort is normal.  Skin:    Findings: Erythema present.     Comments: R side chin--- + errythema  Pt states it is much improved from last week and is almost completely resolved   Neurological:     Mental Status: She is alert and oriented to person, place, and time.  Psychiatric:        Mood and Affect: Mood normal.        Behavior: Behavior normal.        Thought Content: Thought content normal.        Judgment: Judgment normal.    There were no vitals taken for this visit. Wt Readings from Last 3 Encounters:  11/14/20 139 lb (63 kg)  06/13/20 152 lb 9.6 oz (69.2 kg)  04/03/20 153 lb 6.4 oz (69.6 kg)     Health Maintenance Due  Topic Date Due  . COVID-19 Vaccine (4 - Booster for Pfizer series) 05/22/2020  . INFLUENZA VACCINE  10/20/2020    There are no preventive care reminders to display for this patient.  Lab Results  Component Value Date   TSH 1.45 12/10/2019   Lab Results  Component Value Date   WBC 6.4 06/13/2020   HGB 14.4 06/13/2020   HCT 42.6 06/13/2020   MCV 96.9 06/13/2020   PLT 245.0 06/13/2020   Lab Results  Component Value Date   NA 141 08/12/2020   K 4.8 08/12/2020   CO2 29 08/12/2020   GLUCOSE 84 08/12/2020   BUN  13 08/12/2020   CREATININE 0.66 08/12/2020   BILITOT 0.9 08/12/2020   ALKPHOS 54  08/12/2020   AST 19 08/12/2020   ALT 12 08/12/2020   PROT 6.8 08/12/2020   ALBUMIN 4.1 08/12/2020   CALCIUM 9.5 08/12/2020   GFR 86.41 08/12/2020   Lab Results  Component Value Date   CHOL 204 (H) 08/12/2020   Lab Results  Component Value Date   HDL 83.80 08/12/2020   Lab Results  Component Value Date   LDLCALC 107 (H) 08/12/2020   Lab Results  Component Value Date   TRIG 63.0 08/12/2020   Lab Results  Component Value Date   CHOLHDL 2 08/12/2020   Lab Results  Component Value Date   HGBA1C 5.4 04/10/2020      Assessment & Plan:   Problem List Items Addressed This Visit   None   No orders of the defined types were placed in this encounter.   Follow-up: No follow-ups on file.    [name], DO

## 2020-11-17 NOTE — Assessment & Plan Note (Signed)
Healing --- almost completely resolved Finish abx Call if it starts to swell again

## 2020-11-20 ENCOUNTER — Other Ambulatory Visit: Payer: Self-pay

## 2020-11-20 ENCOUNTER — Encounter: Payer: Self-pay | Admitting: Family Medicine

## 2020-11-20 ENCOUNTER — Telehealth (INDEPENDENT_AMBULATORY_CARE_PROVIDER_SITE_OTHER): Payer: Medicare HMO | Admitting: Family Medicine

## 2020-11-20 DIAGNOSIS — L03211 Cellulitis of face: Secondary | ICD-10-CM

## 2020-11-20 MED ORDER — SULFAMETHOXAZOLE-TRIMETHOPRIM 800-160 MG PO TABS: 1.0000 | ORAL_TABLET | Freq: Two times a day (BID) | ORAL | 0 refills | Status: DC

## 2020-11-20 NOTE — Progress Notes (Signed)
MyChart Video Visit    Virtual Visit via Video Note   This visit type was conducted due to national recommendations for restrictions regarding the COVID-19 Pandemic (e.g. social distancing) in an effort to limit this patient's exposure and mitigate transmission in our community. This patient is at least at moderate risk for complications without adequate follow up. This format is felt to be most appropriate for this patient at this time. Physical exam was limited by quality of the video and audio technology used for the visit. Kristen Schultz  was able to get the patient set up on a video visit.  Patient location: Home Patient and provider in visit Provider location: Office  I discussed the limitations of evaluation and management by telemedicine and the availability of in person appointments. The patient expressed understanding and agreed to proceed.  Visit Date: 11/20/2020  Today's healthcare provider: [name], DO     Subjective:    Patient ID: Kristen Schultz, female    DOB: 1945/08/14, 75 y.o.   MRN: [id number]  No chief complaint on file.   HPI Patient is in today for a video visit.  She continues having cellulitis on her right side chin. She has not tried putting a warm compress to manage her symptoms. She has completed her last bactrim-ds 800-160 mg daily PO. She is willing to continues taking bactrim to manage her symptoms. She is also interested in seeing a dermatologist or ENT specialist to manage her symptoms.    Past Medical History:  Diagnosis Date   Anxiety    Colon polyps    Gastric ulcer    per patient; in the 94s   H. pylori infection    Heart murmur    Hyperlipidemia    no treatment per pt   Hypertension    Liver cyst    Ovarian cyst 1984   Pneumonia     Past Surgical History:  Procedure Laterality Date   COLONOSCOPY  02/10/2016   DENTAL SURGERY  2000   dental implant   DENTAL SURGERY  2021   2 molars taken out     ESOPHAGOGASTRODUODENOSCOPY  11/06/2018   [facility]    ESOPHAGOGASTRODUODENOSCOPY  07/23/2019   [place]    EUS  07/23/2019   [place] [facility]     LAPAROSCOPIC LIVER CYST REMOVAL     OVARIAN CYST SURGERY      Family History  Problem Relation Age of Onset   Heart disease Father    Heart disease Brother 7   Heart disease Brother    Dementia Sister    Glaucoma Neg Hx    Colon cancer Neg Hx    Esophageal cancer Neg Hx    Pancreatic cancer Neg Hx    Stomach cancer Neg Hx    Rectal cancer Neg Hx    Colon polyps Neg Hx     Social History   Socioeconomic History   Marital status: Married    Spouse name: Kristen Schultz   Number of children: 1   Years of education: Not on file   Highest education level: Not on file  Occupational History   Occupation: retired    Comment: retired  Tobacco Use   Smoking status: Former    Packs/day: 0.30    Years: 15.00    Pack years: 4.50    Types: Cigarettes    Quit date: 11/17/1985    Years since quitting: 35.0   Smokeless  tobacco: Never  Vaping Use   Vaping Use: Never used  Substance and Sexual Activity   Alcohol use: Yes    Alcohol/week: 14.0 standard drinks    Types: 14 Glasses of wine per week    Comment: 2 glasses per night   Drug use: No   Sexual activity: Not Currently    Partners: Male  Other Topics Concern   Not on file  Social History Narrative   Exercise- Walks about 15-22 miles per week with womens group.   Social Determinants of Health   Financial Resource Strain: Not on file  Food Insecurity: Not on file  Transportation Needs: Not on file  Physical Activity: Not on file  Stress: Not on file  Social Connections: Not on file  Intimate Partner Violence: Not on file    Outpatient Medications Prior to Visit  Medication Sig Dispense Refill   Cholecalciferol (VITAMIN D-3) 125 MCG (5000 UT) TABS Take 1 tablet by mouth daily.     citalopram (CELEXA) 10 MG tablet Take 1 tablet (10 mg  total) by mouth daily. 90 tablet 1   cyanocobalamin 100 MCG tablet Take by mouth.     famotidine (PEPCID) 20 MG tablet Take 1 tablet (20 mg total) by mouth 2 (two) times daily. 60 tablet 5   losartan (COZAAR) 50 MG tablet Take 1 tablet (50 mg total) by mouth daily. 90 tablet 1   Multiple Vitamin (MULTIVITAMIN) capsule Take 1 capsule by mouth daily.     mupirocin ointment (BACTROBAN) 2 % Apply 1 application topically 2 (two) times daily. 22 g 0   sulfamethoxazole-trimethoprim (BACTRIM DS) 800-160 MG tablet Take 1 tablet by mouth 2 (two) times daily. 14 tablet 0   No facility-administered medications prior to visit.    Allergies  Allergen Reactions   Bee Venom Anaphylaxis    Bee stings   Codeine     Extreme Vomiting   Other Other (See Comments)    CDN - reaction unknown   Penicillins     Redness, swelling and itching Can take Keflex   Tetracyclines & Related     Yeast Infection and Stomach problems   Xylocaine Dental [Lidocaine-Epinephrine]     Review of Systems  Constitutional:  Negative for fever and malaise/fatigue.  HENT:  Negative for congestion.   Eyes:  Negative for blurred vision.  Respiratory:  Negative for shortness of breath.   Cardiovascular:  Negative for chest pain, palpitations and leg swelling.  Gastrointestinal:  Negative for abdominal pain, blood in stool and nausea.  Genitourinary:  Negative for dysuria and frequency.  Musculoskeletal:  Negative for falls.  Skin:  Negative for rash.       (+)Cellulitis on right side chin  Neurological:  Negative for dizziness, loss of consciousness and headaches.  Endo/Heme/Allergies:  Negative for environmental allergies.  Psychiatric/Behavioral:  Negative for depression. The patient is not nervous/anxious.       Objective:    Physical Exam Vitals and nursing note reviewed.  Constitutional:      General: She is not in acute distress.    Appearance: Normal appearance. She is not ill-appearing.  Skin:    Comments:  Cellulitis on right side chin---- redness has improved Still small abscess on R side chin  Neurological:     Mental Status: She is alert.    There were no vitals taken for this visit. Wt Readings from Last 3 Encounters:  11/14/20 139 lb (63 kg)  06/13/20 152 lb 9.6 oz (69.2 kg)  04/03/20  153 lb 6.4 oz (69.6 kg)    Diabetic Foot Exam - Simple   No data filed    Lab Results  Component Value Date   WBC 6.4 06/13/2020   HGB 14.4 06/13/2020   HCT 42.6 06/13/2020   PLT 245.0 06/13/2020   GLUCOSE 84 08/12/2020   CHOL 204 (H) 08/12/2020   TRIG 63.0 08/12/2020   HDL 83.80 08/12/2020   LDLCALC 107 (H) 08/12/2020   ALT 12 08/12/2020   AST 19 08/12/2020   NA 141 08/12/2020   K 4.8 08/12/2020   CL 103 08/12/2020   CREATININE 0.66 08/12/2020   BUN 13 08/12/2020   CO2 29 08/12/2020   TSH 1.45 12/10/2019   HGBA1C 5.4 04/10/2020   MICROALBUR <0.7 05/01/2018    Lab Results  Component Value Date   TSH 1.45 12/10/2019   Lab Results  Component Value Date   WBC 6.4 06/13/2020   HGB 14.4 06/13/2020   HCT 42.6 06/13/2020   MCV 96.9 06/13/2020   PLT 245.0 06/13/2020   Lab Results  Component Value Date   NA 141 08/12/2020   K 4.8 08/12/2020   CO2 29 08/12/2020   GLUCOSE 84 08/12/2020   BUN 13 08/12/2020   CREATININE 0.66 08/12/2020   BILITOT 0.9 08/12/2020   ALKPHOS 54 08/12/2020   AST 19 08/12/2020   ALT 12 08/12/2020   PROT 6.8 08/12/2020   ALBUMIN 4.1 08/12/2020   CALCIUM 9.5 08/12/2020   GFR 86.41 08/12/2020   Lab Results  Component Value Date   CHOL 204 (H) 08/12/2020   Lab Results  Component Value Date   HDL 83.80 08/12/2020   Lab Results  Component Value Date   LDLCALC 107 (H) 08/12/2020   Lab Results  Component Value Date   TRIG 63.0 08/12/2020   Lab Results  Component Value Date   CHOLHDL 2 08/12/2020   Lab Results  Component Value Date   HGBA1C 5.4 04/10/2020       Assessment & Plan:   Problem List Items Addressed This Visit    None    No orders of the defined types were placed in this encounter.   I discussed the assessment and treatment plan with the patient. The patient was provided an opportunity to ask questions and all were answered. The patient agreed with the plan and demonstrated an understanding of the instructions.   The patient was advised to call back or seek an in-person evaluation if the symptoms worsen or if the condition fails to improve as anticipated.  I,[name],acting as a Education administrator for [employer], DO.,have documented all relevant documentation on the behalf of [name], DO,as directed by  [name], DO while in the presence of [name], DO.  I provided 20 minutes of face-to-face time during this encounter.   [name], DO [place] at [employer] [phone] (phone) [phone] (fax)  [place]

## 2020-11-27 ENCOUNTER — Encounter: Payer: Self-pay | Admitting: Family Medicine

## 2020-12-04 ENCOUNTER — Other Ambulatory Visit: Payer: Self-pay

## 2020-12-04 ENCOUNTER — Other Ambulatory Visit: Payer: Self-pay | Admitting: Family Medicine

## 2020-12-04 DIAGNOSIS — L03211 Cellulitis of face: Secondary | ICD-10-CM

## 2020-12-04 DIAGNOSIS — F4323 Adjustment disorder with mixed anxiety and depressed mood: Secondary | ICD-10-CM

## 2020-12-05 ENCOUNTER — Encounter: Payer: Self-pay | Admitting: Family Medicine

## 2020-12-05 ENCOUNTER — Ambulatory Visit (INDEPENDENT_AMBULATORY_CARE_PROVIDER_SITE_OTHER): Payer: Medicare HMO | Admitting: Family Medicine

## 2020-12-05 ENCOUNTER — Other Ambulatory Visit: Payer: Self-pay

## 2020-12-05 VITALS — BP 110/60 | HR 95 | Temp 97.8°F | Resp 18 | Ht 65.0 in | Wt 140.2 lb

## 2020-12-05 DIAGNOSIS — L03211 Cellulitis of face: Secondary | ICD-10-CM

## 2020-12-05 DIAGNOSIS — Z23 Encounter for immunization: Secondary | ICD-10-CM

## 2020-12-05 NOTE — Progress Notes (Signed)
Subjective:   By signing my name below, I, Zite Okoli, attest that this documentation has been prepared under the direction and in the presence of [name], DO. 12/05/2020    Patient ID: Kristen Schultz, female    DOB: 16-Jan-1946, 75 y.o.   MRN: [id number]  Chief Complaint  Patient presents with   Geryl Councilman Concern    Pt states cellulitis is better. Pt states having a dark spot and bump on her chin now. Pt states no pain     HPI Patient is in today for an office visit.  She mentions she is under a lot of pressure at home and is going through so much stress.  She is here for a follow-up of the cellulitis on the right side of her chin. She was using sulfur to manage the chin and it is looking better now.  She also mentions she has been on a diet and has lost about 15 pounds. It might also be due to the stress from her family. She is also worried about her cholesterol levels and would like to get them checked.   She is also willing to get the flu vaccine today. She has 4 Pfizer Covid-19 vaccines at this time.  Past Medical History:  Diagnosis Date   Anxiety    Colon polyps    Gastric ulcer    per patient; in the 54s   H. pylori infection    Heart murmur    Hyperlipidemia    no treatment per pt   Hypertension    Liver cyst    Ovarian cyst 1984   Pneumonia     Past Surgical History:  Procedure Laterality Date   COLONOSCOPY  02/10/2016   DENTAL SURGERY  2000   dental implant   DENTAL SURGERY  2021   2 molars taken out    ESOPHAGOGASTRODUODENOSCOPY  11/06/2018   [facility]    ESOPHAGOGASTRODUODENOSCOPY  07/23/2019   [place]    EUS  07/23/2019   [facility]     LAPAROSCOPIC LIVER CYST REMOVAL     OVARIAN CYST SURGERY      Family History  Problem Relation Age of Onset   Heart disease Father    Heart disease Brother 61   Heart disease Brother    Dementia Sister    Glaucoma Neg Hx    Colon cancer  Neg Hx    Esophageal cancer Neg Hx    Pancreatic cancer Neg Hx    Stomach cancer Neg Hx    Rectal cancer Neg Hx    Colon polyps Neg Hx     Social History   Socioeconomic History   Marital status: Married    Spouse name: Tom   Number of children: 1   Years of education: Not on file   Highest education level: Not on file  Occupational History   Occupation: retired    Comment: retired  Tobacco Use   Smoking status: Former    Packs/day: 0.30    Years: 15.00    Pack years: 4.50    Types: Cigarettes    Quit date: 11/17/1985    Years since quitting: 35.0   Smokeless tobacco: Never  Vaping Use   Vaping Use: Never used  Substance and Sexual Activity   Alcohol use: Yes    Alcohol/week: 14.0 standard drinks    Types: 14 Glasses of wine per week    Comment: 2 glasses per  night   Drug use: No   Sexual activity: Not Currently    Partners: Male  Other Topics Concern   Not on file  Social History Narrative   Exercise- Walks about 15-22 miles per week with womens group.   Social Determinants of Health   Financial Resource Strain: Not on file  Food Insecurity: Not on file  Transportation Needs: Not on file  Physical Activity: Not on file  Stress: Not on file  Social Connections: Not on file  Intimate Partner Violence: Not on file    Outpatient Medications Prior to Visit  Medication Sig Dispense Refill   Cholecalciferol (VITAMIN D-3) 125 MCG (5000 UT) TABS Take 1 tablet by mouth daily.     citalopram (CELEXA) 10 MG tablet TAKE 1 TABLET BY MOUTH EVERY DAY 90 tablet 1   cyanocobalamin 100 MCG tablet Take by mouth.     famotidine (PEPCID) 20 MG tablet Take 1 tablet (20 mg total) by mouth 2 (two) times daily. 60 tablet 5   losartan (COZAAR) 50 MG tablet Take 1 tablet (50 mg total) by mouth daily. 90 tablet 1   Multiple Vitamin (MULTIVITAMIN) capsule Take 1 capsule by mouth daily.     mupirocin ointment (BACTROBAN) 2 % Apply 1 application topically 2 (two) times daily. 22 g 0    sulfamethoxazole-trimethoprim (BACTRIM DS) 800-160 MG tablet Take 1 tablet by mouth 2 (two) times daily. (Patient not taking: Reported on 12/05/2020) 14 tablet 0   No facility-administered medications prior to visit.    Allergies  Allergen Reactions   Bee Venom Anaphylaxis    Bee stings   Codeine     Extreme Vomiting   Other Other (See Comments)    CDN - reaction unknown   Penicillins     Redness, swelling and itching Can take Keflex   Tetracyclines & Related     Yeast Infection and Stomach problems   Xylocaine Dental [Lidocaine-Epinephrine]     Review of Systems  Constitutional:  Negative for fever.  HENT:  Negative for congestion, ear pain, hearing loss, sinus pain and sore throat.   Eyes:  Negative for pain.  Respiratory:  Negative for cough, shortness of breath and wheezing.   Cardiovascular:  Negative for chest pain and palpitations.  Gastrointestinal:  Negative for blood in stool, constipation, diarrhea, nausea and vomiting.  Genitourinary:  Negative for dysuria, frequency and hematuria.  Neurological:  Negative for headaches.  Psychiatric/Behavioral:  Negative for depression. The patient is not nervous/anxious.       Objective:    Physical Exam Vitals and nursing note reviewed.  Constitutional:      General: She is not in acute distress.    Appearance: Normal appearance. She is not ill-appearing.  HENT:     Head: Normocephalic and atraumatic.     Right Ear: External ear normal.     Left Ear: External ear normal.  Eyes:     Extraocular Movements: Extraocular movements intact.     Pupils: Pupils are equal, round, and reactive to light.  Cardiovascular:     Rate and Rhythm: Normal rate and regular rhythm.     Pulses: Normal pulses.     Heart sounds: Normal heart sounds. No murmur heard.   No gallop.  Pulmonary:     Effort: Pulmonary effort is normal. No respiratory distress.     Breath sounds: Normal breath sounds. No wheezing, rhonchi or rales.  Abdominal:      General: Bowel sounds are normal. There is no distension.  Palpations: Abdomen is soft. There is no mass.     Tenderness: There is no abdominal tenderness. There is no guarding or rebound.     Hernia: No hernia is present.  Musculoskeletal:     Cervical back: Normal range of motion and neck supple.  Lymphadenopathy:     Cervical: No cervical adenopathy.  Skin:    General: Skin is warm and dry.     Findings: No erythema.     Comments: Redness / tenderness resolved   Neurological:     Mental Status: She is alert and oriented to person, place, and time.  Psychiatric:        Behavior: Behavior normal.    BP 110/60 (BP Location: Left Arm, Patient Position: Sitting, Cuff Size: Normal)   Pulse 95   Temp 97.8 F (36.6 C) (Oral)   Resp 18   Ht '5\' 5"'$  (1.651 m)   Wt 140 lb 3.2 oz (63.6 kg)   SpO2 98%   BMI 23.33 kg/m  Wt Readings from Last 3 Encounters:  12/05/20 140 lb 3.2 oz (63.6 kg)  11/14/20 139 lb (63 kg)  06/13/20 152 lb 9.6 oz (69.2 kg)    Diabetic Foot Exam - Simple   No data filed    Lab Results  Component Value Date   WBC 6.4 06/13/2020   HGB 14.4 06/13/2020   HCT 42.6 06/13/2020   PLT 245.0 06/13/2020   GLUCOSE 84 08/12/2020   CHOL 204 (H) 08/12/2020   TRIG 63.0 08/12/2020   HDL 83.80 08/12/2020   LDLCALC 107 (H) 08/12/2020   ALT 12 08/12/2020   AST 19 08/12/2020   NA 141 08/12/2020   K 4.8 08/12/2020   CL 103 08/12/2020   CREATININE 0.66 08/12/2020   BUN 13 08/12/2020   CO2 29 08/12/2020   TSH 1.45 12/10/2019   HGBA1C 5.4 04/10/2020   MICROALBUR <0.7 05/01/2018    Lab Results  Component Value Date   TSH 1.45 12/10/2019   Lab Results  Component Value Date   WBC 6.4 06/13/2020   HGB 14.4 06/13/2020   HCT 42.6 06/13/2020   MCV 96.9 06/13/2020   PLT 245.0 06/13/2020   Lab Results  Component Value Date   NA 141 08/12/2020   K 4.8 08/12/2020   CO2 29 08/12/2020   GLUCOSE 84 08/12/2020   BUN 13 08/12/2020   CREATININE 0.66 08/12/2020    BILITOT 0.9 08/12/2020   ALKPHOS 54 08/12/2020   AST 19 08/12/2020   ALT 12 08/12/2020   PROT 6.8 08/12/2020   ALBUMIN 4.1 08/12/2020   CALCIUM 9.5 08/12/2020   GFR 86.41 08/12/2020   Lab Results  Component Value Date   CHOL 204 (H) 08/12/2020   Lab Results  Component Value Date   HDL 83.80 08/12/2020   Lab Results  Component Value Date   LDLCALC 107 (H) 08/12/2020   Lab Results  Component Value Date   TRIG 63.0 08/12/2020   Lab Results  Component Value Date   CHOLHDL 2 08/12/2020   Lab Results  Component Value Date   HGBA1C 5.4 04/10/2020       Assessment & Plan:   Problem List Items Addressed This Visit       Unprioritized   Cellulitis of chin - Primary    Resolved  If pain / redness returns === call / rto       Other Visit Diagnoses     Need for influenza vaccination       Relevant  Orders   Flu Vaccine QUAD High Dose(Fluad) (Completed)         No orders of the defined types were placed in this encounter.  I,Zite Okoli,acting as a Education administrator for [employer], DO.,have documented all relevant documentation on the behalf of [name], DO,as directed by  [name], DO while in the presence of [name], [place], DO., personally preformed the services described in this documentation.  All medical record entries made by the scribe were at my direction and in my presence.  I have reviewed the chart and discharge instructions (if applicable) and agree that the record reflects my personal performance and is accurate and complete. 12/05/2020

## 2020-12-05 NOTE — Assessment & Plan Note (Signed)
Resolved  If pain / redness returns === call / rto

## 2020-12-05 NOTE — Patient Instructions (Addendum)

## 2020-12-08 ENCOUNTER — Other Ambulatory Visit (HOSPITAL_BASED_OUTPATIENT_CLINIC_OR_DEPARTMENT_OTHER): Payer: Self-pay

## 2020-12-08 ENCOUNTER — Ambulatory Visit: Payer: Medicare HMO | Attending: Internal Medicine

## 2020-12-08 DIAGNOSIS — Z23 Encounter for immunization: Secondary | ICD-10-CM

## 2020-12-08 MED ORDER — PFIZER COVID-19 VAC BIVALENT 30 MCG/0.3ML IM SUSP
INTRAMUSCULAR | 0 refills | Status: DC
  Filled 2020-12-08: qty 0.3, 1d supply, fill #0

## 2020-12-08 NOTE — Progress Notes (Signed)
   Covid-19 Vaccination Clinic  Name:  Kristen Schultz    MRN: [id number] DOB: 1945/03/26  12/08/2020  Kristen Schultz was observed post Covid-19 immunization for 15 minutes without incident. She was provided with Vaccine Information Sheet and instruction to access the V-Safe system.   Kristen Schultz was instructed to call 911 with any severe reactions post vaccine: Difficulty breathing  Swelling of face and throat  A fast heartbeat  A bad rash all over body  Dizziness and weakness

## 2020-12-10 ENCOUNTER — Other Ambulatory Visit: Payer: Self-pay | Admitting: Family Medicine

## 2020-12-10 DIAGNOSIS — R1013 Epigastric pain: Secondary | ICD-10-CM

## 2020-12-15 ENCOUNTER — Encounter: Payer: Self-pay | Admitting: Family Medicine

## 2020-12-15 ENCOUNTER — Ambulatory Visit (INDEPENDENT_AMBULATORY_CARE_PROVIDER_SITE_OTHER): Payer: Medicare HMO | Admitting: Family Medicine

## 2020-12-15 ENCOUNTER — Other Ambulatory Visit: Payer: Self-pay

## 2020-12-15 VITALS — BP 130/80 | HR 71 | Temp 97.8°F | Resp 18 | Ht 65.0 in | Wt 137.4 lb

## 2020-12-15 DIAGNOSIS — I10 Essential (primary) hypertension: Secondary | ICD-10-CM

## 2020-12-15 DIAGNOSIS — E785 Hyperlipidemia, unspecified: Secondary | ICD-10-CM

## 2020-12-15 LAB — COMPREHENSIVE METABOLIC PANEL
ALT: 14 U/L (ref 0–35)
AST: 21 U/L (ref 0–37)
Albumin: 3.9 g/dL (ref 3.5–5.2)
Alkaline Phosphatase: 49 U/L (ref 39–117)
BUN: 14 mg/dL (ref 6–23)
CO2: 29 mEq/L (ref 19–32)
Calcium: 9.5 mg/dL (ref 8.4–10.5)
Chloride: 100 mEq/L (ref 96–112)
Creatinine, Ser: 0.68 mg/dL (ref 0.40–1.20)
GFR: 85.58 mL/min (ref >60.00)
Glucose, Bld: 81 mg/dL (ref 70–99)
Potassium: 5 mEq/L (ref 3.5–5.1)
Sodium: 137 mEq/L (ref 135–145)
Total Bilirubin: 0.6 mg/dL (ref 0.2–1.2)
Total Protein: 6.7 g/dL (ref 6.0–8.3)

## 2020-12-15 LAB — LIPID PANEL
Cholesterol: 197 mg/dL (ref 0–200)
HDL: 84.2 mg/dL (ref >39.00)
LDL Cholesterol: 102 mg/dL — ABNORMAL HIGH (ref 0–99)
NonHDL: 112.89
Total CHOL/HDL Ratio: 2
Triglycerides: 54 mg/dL (ref 0.0–149.0)
VLDL: 10.8 mg/dL (ref 0.0–40.0)

## 2020-12-15 NOTE — Assessment & Plan Note (Signed)
Well controlled, no changes to meds. Encouraged heart healthy diet such as the DASH diet and exercise as tolerated.  °

## 2020-12-15 NOTE — Progress Notes (Signed)
Established Patient Office Visit  Subjective:  Patient ID: Kristen Schultz, female    DOB: [birth year]  Age: 75 y.o. MRN: [id number]  CC:  Chief Complaint  Patient presents with   Hypertension   Hyperlipidemia   Follow-up    HPI [facility] presents for f/u htn, chol and cellulitis.  Pt has no complaints   Past Medical History:  Diagnosis Date   Anxiety    Colon polyps    Gastric ulcer    per patient; in the 1970s   H. pylori infection    Heart murmur    Hyperlipidemia    no treatment per pt   Hypertension    Liver cyst    Ovarian cyst 1984   Pneumonia     Past Surgical History:  Procedure Laterality Date   COLONOSCOPY  02/10/2016   DENTAL SURGERY  2000   dental implant   DENTAL SURGERY  2021   2 molars taken out    ESOPHAGOGASTRODUODENOSCOPY  11/06/2018   [facility]    ESOPHAGOGASTRODUODENOSCOPY  07/23/2019   [place]    EUS  07/23/2019   [place] [facility]     LAPAROSCOPIC LIVER CYST REMOVAL     OVARIAN CYST SURGERY      Family History  Problem Relation Age of Onset   Heart disease Father    Heart disease Brother 99   Heart disease Brother    Dementia Sister    Glaucoma Neg Hx    Colon cancer Neg Hx    Esophageal cancer Neg Hx    Pancreatic cancer Neg Hx    Stomach cancer Neg Hx    Rectal cancer Neg Hx    Colon polyps Neg Hx     Social History   Socioeconomic History   Marital status: Married    Spouse name: Tom   Number of children: 1   Years of education: Not on file   Highest education level: Not on file  Occupational History   Occupation: retired    Comment: retired  Tobacco Use   Smoking status: Former    Packs/day: 0.30    Years: 15.00    Pack years: 4.50    Types: Cigarettes    Quit date: 11/17/1985    Years since quitting: 35.1   Smokeless tobacco: Never  Vaping Use   Vaping Use: Never used  Substance and Sexual Activity   Alcohol use: Yes    Alcohol/week: 14.0  standard drinks    Types: 14 Glasses of wine per week    Comment: 2 glasses per night   Drug use: No   Sexual activity: Not Currently    Partners: Male  Other Topics Concern   Not on file  Social History Narrative   Exercise- Walks about 15-22 miles per week with womens group.   Social Determinants of Health   Financial Resource Strain: Not on file  Food Insecurity: Not on file  Transportation Needs: Not on file  Physical Activity: Not on file  Stress: Not on file  Social Connections: Not on file  Intimate Partner Violence: Not on file    Outpatient Medications Prior to Visit  Medication Sig Dispense Refill   Cholecalciferol (VITAMIN D-3) 125 MCG (5000 UT) TABS Take 1 tablet by mouth daily.     citalopram (CELEXA) 10 MG tablet TAKE 1 TABLET BY MOUTH EVERY DAY 90 tablet 1   cyanocobalamin 100 MCG tablet Take by mouth.  famotidine (PEPCID) 20 MG tablet TAKE 1 TABLET BY MOUTH TWICE A DAY 180 tablet 1   losartan (COZAAR) 50 MG tablet Take 1 tablet (50 mg total) by mouth daily. 90 tablet 1   Multiple Vitamin (MULTIVITAMIN) capsule Take 1 capsule by mouth daily.     mupirocin ointment (BACTROBAN) 2 % Apply 1 application topically 2 (two) times daily. 22 g 0   COVID-19 mRNA bivalent vaccine, Pfizer, (PFIZER COVID-19 VAC BIVALENT) injection Inject into the muscle. (Patient not taking: Reported on 12/15/2020) 0.3 mL 0   No facility-administered medications prior to visit.    Allergies  Allergen Reactions   Bee Venom Anaphylaxis    Bee stings   Codeine     Extreme Vomiting   Other Other (See Comments)    CDN - reaction unknown   Penicillins     Redness, swelling and itching Can take Keflex   Tetracyclines & Related     Yeast Infection and Stomach problems   Xylocaine Dental [Lidocaine-Epinephrine]     ROS Review of Systems  Constitutional:  Negative for activity change, appetite change, fatigue and unexpected weight change.  Respiratory:  Negative for cough and  shortness of breath.   Cardiovascular:  Negative for chest pain and palpitations.  Psychiatric/Behavioral:  Negative for behavioral problems and dysphoric mood. The patient is not nervous/anxious.      Objective:    Physical Exam Vitals and nursing note reviewed.  Constitutional:      Appearance: She is well-developed.  HENT:     Head: Normocephalic and atraumatic.  Eyes:     Conjunctiva/sclera: Conjunctivae normal.  Neck:     Thyroid: No thyromegaly.     Vascular: No carotid bruit or JVD.  Cardiovascular:     Rate and Rhythm: Normal rate and regular rhythm.     Heart sounds: Normal heart sounds. No murmur heard. Pulmonary:     Effort: Pulmonary effort is normal. No respiratory distress.     Breath sounds: Normal breath sounds. No wheezing or rales.  Chest:     Chest wall: No tenderness.  Musculoskeletal:     Cervical back: Normal range of motion and neck supple.  Neurological:     Mental Status: She is alert and oriented to person, place, and time.    BP 130/80 (BP Location: Right Arm, Patient Position: Sitting, Cuff Size: Normal)   Pulse 71   Temp 97.8 F (36.6 C) (Oral)   Resp 18   Ht 5\' 5"  (1.651 m)   Wt 137 lb 6.4 oz (62.3 kg)   SpO2 98%   BMI 22.86 kg/m  Wt Readings from Last 3 Encounters:  12/15/20 137 lb 6.4 oz (62.3 kg)  12/05/20 140 lb 3.2 oz (63.6 kg)  11/14/20 139 lb (63 kg)     Health Maintenance Due  Topic Date Due   COVID-19 Vaccine (4 - Booster for Pfizer series) 05/22/2020    There are no preventive care reminders to display for this patient.  Lab Results  Component Value Date   TSH 1.45 12/10/2019   Lab Results  Component Value Date   WBC 6.4 06/13/2020   HGB 14.4 06/13/2020   HCT 42.6 06/13/2020   MCV 96.9 06/13/2020   PLT 245.0 06/13/2020   Lab Results  Component Value Date   NA 141 08/12/2020   K 4.8 08/12/2020   CO2 29 08/12/2020   GLUCOSE 84 08/12/2020   BUN 13 08/12/2020   CREATININE 0.66 08/12/2020   BILITOT 0.9  08/12/2020  ALKPHOS 54 08/12/2020   AST 19 08/12/2020   ALT 12 08/12/2020   PROT 6.8 08/12/2020   ALBUMIN 4.1 08/12/2020   CALCIUM 9.5 08/12/2020   GFR 86.41 08/12/2020   Lab Results  Component Value Date   CHOL 204 (H) 08/12/2020   Lab Results  Component Value Date   HDL 83.80 08/12/2020   Lab Results  Component Value Date   LDLCALC 107 (H) 08/12/2020   Lab Results  Component Value Date   TRIG 63.0 08/12/2020   Lab Results  Component Value Date   CHOLHDL 2 08/12/2020   Lab Results  Component Value Date   HGBA1C 5.4 04/10/2020      Assessment & Plan:   Problem List Items Addressed This Visit       Unprioritized   Essential hypertension    Well controlled, no changes to meds. Encouraged heart healthy diet such as the DASH diet and exercise as tolerated.        Hyperlipidemia    Encourage heart healthy diet such as MIND or DASH diet, increase exercise, avoid trans fats, simple carbohydrates and processed foods, consider a krill or fish or flaxseed oil cap daily.       Relevant Orders   Comprehensive metabolic panel   Lipid panel   Other Visit Diagnoses     Primary hypertension    -  Primary   Relevant Orders   Comprehensive metabolic panel   Lipid panel       No orders of the defined types were placed in this encounter.   Follow-up: Return in about 6 months (around 06/14/2021), or if symptoms worsen or fail to improve, for annual exam, fasting.    [name], DO

## 2020-12-15 NOTE — Patient Instructions (Signed)

## 2020-12-15 NOTE — Assessment & Plan Note (Signed)
Encourage heart healthy diet such as MIND or DASH diet, increase exercise, avoid trans fats, simple carbohydrates and processed foods, consider a krill or fish or flaxseed oil cap daily.  °

## 2020-12-18 ENCOUNTER — Telehealth: Payer: Self-pay | Admitting: Family Medicine

## 2020-12-18 NOTE — Telephone Encounter (Signed)
Left message for patient to call back and schedule Medicare Annual Wellness Visit (AWV) in office.   If not able to come in office, please offer to do virtually or by telephone.  Left office number and my jabber [phone].  Last AWV:12/04/2018  Please schedule at anytime with Nurse Health Advisor.

## 2020-12-30 ENCOUNTER — Other Ambulatory Visit: Payer: Self-pay | Admitting: Family Medicine

## 2020-12-30 DIAGNOSIS — I10 Essential (primary) hypertension: Secondary | ICD-10-CM

## 2020-12-31 ENCOUNTER — Encounter: Payer: Self-pay | Admitting: Family

## 2020-12-31 ENCOUNTER — Other Ambulatory Visit: Payer: Self-pay

## 2020-12-31 ENCOUNTER — Other Ambulatory Visit (HOSPITAL_BASED_OUTPATIENT_CLINIC_OR_DEPARTMENT_OTHER): Payer: Self-pay

## 2020-12-31 ENCOUNTER — Ambulatory Visit (INDEPENDENT_AMBULATORY_CARE_PROVIDER_SITE_OTHER): Payer: Medicare HMO | Admitting: Family

## 2020-12-31 VITALS — BP 155/77 | HR 72 | Temp 97.9°F | Resp 16 | Wt 138.0 lb

## 2020-12-31 DIAGNOSIS — R21 Rash and other nonspecific skin eruption: Secondary | ICD-10-CM

## 2020-12-31 DIAGNOSIS — R300 Dysuria: Secondary | ICD-10-CM

## 2020-12-31 LAB — URINALYSIS, ROUTINE W REFLEX MICROSCOPIC
Bilirubin Urine: NEGATIVE
Hgb urine dipstick: NEGATIVE
Ketones, ur: NEGATIVE
Leukocytes,Ua: NEGATIVE
Nitrite: NEGATIVE
RBC / HPF: NONE SEEN (ref 0–?)
Specific Gravity, Urine: <=1.005 — AB (ref 1.000–1.030)
Total Protein, Urine: NEGATIVE
Urine Glucose: NEGATIVE
Urobilinogen, UA: 0.2 (ref 0.0–1.0)
pH: 7 (ref 5.0–8.0)

## 2020-12-31 MED ORDER — CIPROFLOXACIN HCL 250 MG PO TABS
250.0000 mg | ORAL_TABLET | Freq: Two times a day (BID) | ORAL | 0 refills | Status: AC
  Filled 2020-12-31: qty 6, 3d supply, fill #0

## 2020-12-31 NOTE — Patient Instructions (Signed)
Please begin cipro for Urinary tract infection. Complete lab work prior to leaving. Call if symptoms worsen or if symptoms do not improve.

## 2020-12-31 NOTE — Assessment & Plan Note (Signed)
New. Suspect UTI. Await urine specimen today (could not provide at the time of her visit). Will begin cipro 250mg  bid.  She was recently on a a prolonged course of bactrim for cellulitis and has a penicillin allergy.

## 2020-12-31 NOTE — Assessment & Plan Note (Addendum)
New. ? HSV2 lesions. She denies known history of HSV2.  Will check HSV2 IgG.

## 2020-12-31 NOTE — Progress Notes (Signed)
Subjective:     Patient ID: Kristen Schultz, female    DOB: 1945-07-27, 75 y.o.   MRN: [id number]  Chief Complaint  Patient presents with   Dysuria    Complains of discomfort and burning with urination.    Abdominal Pain    Complains of suprapubic pain    Dysuria   Abdominal Pain Associated symptoms include dysuria.  Patient is in today to discuss urinary issues.  States that she has burning with urination. Also has some suprapubic pain.  She reports that her last UTI was about 2 years ago.  She has associated urgency with some incontinence.    She reports occasional rash under her buttocks.  Reports that rash comes and goes in "cycles."  Report lesions often feel "raw."   Reports that she had cellulitis under her chin 1 month ago which has resolved.    Health Maintenance Due  Topic Date Due   COVID-19 Vaccine (4 - Booster for [employer] series) 05/22/2020    Past Medical History:  Diagnosis Date   Anxiety    Colon polyps    Gastric ulcer    per patient; in the 1970s   H. pylori infection    Heart murmur    Hyperlipidemia    no treatment per pt   Hypertension    Liver cyst    Ovarian cyst 1984   Pneumonia     Past Surgical History:  Procedure Laterality Date   COLONOSCOPY  02/10/2016   DENTAL SURGERY  2000   dental implant   DENTAL SURGERY  2021   2 molars taken out    ESOPHAGOGASTRODUODENOSCOPY  11/06/2018   [facility]    ESOPHAGOGASTRODUODENOSCOPY  07/23/2019   [place]    EUS  07/23/2019   [place] [facility]     LAPAROSCOPIC LIVER CYST REMOVAL     OVARIAN CYST SURGERY      Family History  Problem Relation Age of Onset   Heart disease Father    Heart disease Brother 81   Heart disease Brother    Dementia Sister    Glaucoma Neg Hx    Colon cancer Neg Hx    Esophageal cancer Neg Hx    Pancreatic cancer Neg Hx    Stomach cancer Neg Hx    Rectal cancer Neg Hx    Colon polyps Neg Hx     Social  History   Socioeconomic History   Marital status: Married    Spouse name: Tom   Number of children: 1   Years of education: Not on file   Highest education level: Not on file  Occupational History   Occupation: retired    Comment: retired  Tobacco Use   Smoking status: Former    Packs/day: 0.30    Years: 15.00    Pack years: 4.50    Types: Cigarettes    Quit date: 11/17/1985    Years since quitting: 35.1   Smokeless tobacco: Never  Vaping Use   Vaping Use: Never used  Substance and Sexual Activity   Alcohol use: Yes    Alcohol/week: 14.0 standard drinks    Types: 14 Glasses of wine per week    Comment: 2 glasses per night   Drug use: No   Sexual activity: Not Currently    Partners: Male  Other Topics Concern   Not on file  Social History Narrative   Exercise- Walks about 15-22 miles per week with womens  group.   Social Determinants of Health   Financial Resource Strain: Not on file  Food Insecurity: Not on file  Transportation Needs: Not on file  Physical Activity: Not on file  Stress: Not on file  Social Connections: Not on file  Intimate Partner Violence: Not on file    Outpatient Medications Prior to Visit  Medication Sig Dispense Refill   Cholecalciferol (VITAMIN D-3) 125 MCG (5000 UT) TABS Take 1 tablet by mouth daily.     citalopram (CELEXA) 10 MG tablet TAKE 1 TABLET BY MOUTH EVERY DAY 90 tablet 1   cyanocobalamin 100 MCG tablet Take by mouth.     famotidine (PEPCID) 20 MG tablet TAKE 1 TABLET BY MOUTH TWICE A DAY 180 tablet 1   losartan (COZAAR) 50 MG tablet TAKE 1 TABLET BY MOUTH EVERY DAY 90 tablet 1   Multiple Vitamin (MULTIVITAMIN) capsule Take 1 capsule by mouth daily.     mupirocin ointment (BACTROBAN) 2 % Apply 1 application topically 2 (two) times daily. 22 g 0   No facility-administered medications prior to visit.    Allergies  Allergen Reactions   Bee Venom Anaphylaxis    Bee stings   Codeine     Extreme Vomiting   Other Other (See  Comments)    CDN - reaction unknown   Penicillins     Redness, swelling and itching Can take Keflex   Tetracyclines & Related     Yeast Infection and Stomach problems   Xylocaine Dental [Lidocaine-Epinephrine]     Review of Systems  Gastrointestinal:  Positive for abdominal pain.  Genitourinary:  Positive for dysuria.     See HPI Objective:    Physical Exam Constitutional:      General: She is not in acute distress.    Appearance: Normal appearance. She is well-developed.  HENT:     Head: Normocephalic and atraumatic.     Right Ear: External ear normal.     Left Ear: External ear normal.  Eyes:     General: No scleral icterus. Neck:     Thyroid: No thyromegaly.  Cardiovascular:     Rate and Rhythm: Normal rate and regular rhythm.     Heart sounds: Normal heart sounds. No murmur heard. Pulmonary:     Effort: Pulmonary effort is normal. No respiratory distress.     Breath sounds: Normal breath sounds. No wheezing.  Abdominal:     Palpations: Abdomen is soft.     Tenderness: There is no abdominal tenderness. There is no right CVA tenderness or left CVA tenderness.  Musculoskeletal:     Cervical back: Neck supple.  Skin:    General: Skin is warm and dry.     Comments: Few scabbed ulcers noted on bilateral buttocks. Some hyperpigmentation on buttocks a sites of healed lesions  Neurological:     Mental Status: She is alert and oriented to person, place, and time.  Psychiatric:        Mood and Affect: Mood normal.        Behavior: Behavior normal.        Thought Content: Thought content normal.        Judgment: Judgment normal.    BP (!) 155/77 (BP Location: Right Arm, Patient Position: Sitting, Cuff Size: Small)   Pulse 72   Temp 97.9 F (36.6 C) (Oral)   Resp 16   Wt 138 lb (62.6 kg)   SpO2 100%   BMI 22.96 kg/m  Wt Readings from Last 3 Encounters:  12/31/20 138 lb (62.6 kg)  12/15/20 137 lb 6.4 oz (62.3 kg)  12/05/20 140 lb 3.2 oz (63.6 kg)        Assessment & Plan:   Problem List Items Addressed This Visit       Unprioritized   Skin rash - Primary    ? HSV2 lesions. She denies known history of HSV2.  Will check HSV2 IgG.      Relevant Orders   HSV 2 antibody, IgG   Dysuria    New. Suspect UTI. Await urine specimen today (could not provide at the time of her visit). Will begin cipro 250mg  bid.  She was recently on a a prolonged course of bactrim for cellulitis and has a penicillin allergy.       Relevant Orders   Urinalysis, Routine w reflex microscopic   Urine Culture    I am having Kristen Schultz start on ciprofloxacin. I am also having her maintain her multivitamin, cyanocobalamin, Vitamin D-3, mupirocin ointment, citalopram, famotidine, and losartan.  Meds ordered this encounter  Medications   ciprofloxacin (CIPRO) 250 MG tablet    Sig: Take 1 tablet (250 mg total) by mouth 2 (two) times daily for 3 days.    Dispense:  6 tablet    Refill:  0    Order Specific Question:   Supervising Provider    Answer:   Penni Homans A [[id number]]

## 2021-01-01 LAB — URINE CULTURE
MICRO NUMBER:: 12493509
SPECIMEN QUALITY:: ADEQUATE

## 2021-01-01 LAB — HSV 2 ANTIBODY, IGG: HSV 2 Glycoprotein G Ab, IgG: <0.9 index

## 2021-01-12 DIAGNOSIS — H2513 Age-related nuclear cataract, bilateral: Secondary | ICD-10-CM | POA: Diagnosis not present

## 2021-01-13 ENCOUNTER — Other Ambulatory Visit: Payer: Self-pay

## 2021-01-13 ENCOUNTER — Ambulatory Visit (INDEPENDENT_AMBULATORY_CARE_PROVIDER_SITE_OTHER): Payer: Medicare HMO

## 2021-01-13 VITALS — BP 118/70 | HR 64 | Temp 97.6°F | Resp 16 | Ht 65.0 in | Wt 138.4 lb

## 2021-01-13 DIAGNOSIS — Z0000 Encounter for general adult medical examination without abnormal findings: Secondary | ICD-10-CM

## 2021-01-13 NOTE — Progress Notes (Signed)
Subjective:   Kristen Schultz is a 75 y.o. female who presents for Medicare Annual (Subsequent) preventive examination.  Review of Systems     Cardiac Risk Factors include: advanced age (>42men, >27 women);hypertension;dyslipidemia     Objective:    Today's Vitals   01/13/21 1016  BP: 118/70  Pulse: 64  Resp: 16  Temp: 97.6 F (36.4 C)  TempSrc: Temporal  SpO2: 98%  Weight: 138 lb 6.4 oz (62.8 kg)  Height: 5\' 5"  (1.651 m)   Body mass index is 23.03 kg/m.  Advanced Directives 01/13/2021 12/04/2018 11/29/2017 11/18/2015 05/19/2015  Does Patient Have a Medical Advance Directive? Yes Yes Yes Yes Yes  Type of Paramedic of [place];Living will [place];Living will [place];Living will [place];Living will [place];Living will  Does patient want to make changes to medical advance directive? - No - Patient declined - No - Patient declined No - Patient declined  Copy of [place] in Chart? Yes - validated most recent copy scanned in chart (See row information) Yes - validated most recent copy scanned in chart (See row information) Yes No - copy requested No - copy requested    Current Medications (verified) Outpatient Encounter Medications as of 01/13/2021  Medication Sig   Cholecalciferol (VITAMIN D-3) 125 MCG (5000 UT) TABS Take 1 tablet by mouth daily.   citalopram (CELEXA) 10 MG tablet TAKE 1 TABLET BY MOUTH EVERY DAY   cyanocobalamin 100 MCG tablet Take by mouth.   famotidine (PEPCID) 20 MG tablet TAKE 1 TABLET BY MOUTH TWICE A DAY   losartan (COZAAR) 50 MG tablet TAKE 1 TABLET BY MOUTH EVERY DAY   Multiple Vitamin (MULTIVITAMIN) capsule Take 1 capsule by mouth daily.   mupirocin ointment (BACTROBAN) 2 % Apply 1 application topically 2 (two) times daily.   No facility-administered encounter medications on file as of 01/13/2021.    Allergies  (verified) Bee venom, Codeine, Other, Penicillins, Tetracyclines & related, and Xylocaine dental [lidocaine-epinephrine]   History: Past Medical History:  Diagnosis Date   Anxiety    Cellulitis of chin 11/17/2020   Colon polyps    COVID-19 11/17/2020   Gastric ulcer    per patient; in the 1970s   H. pylori infection    Heart murmur    Hyperlipidemia    no treatment per pt   Hypertension    Liver cyst    Ovarian cyst 1984   Pneumonia    Past Surgical History:  Procedure Laterality Date   COLONOSCOPY  02/10/2016   DENTAL SURGERY  2000   dental implant   DENTAL SURGERY  2021   2 molars taken out    ESOPHAGOGASTRODUODENOSCOPY  11/06/2018   [facility]    ESOPHAGOGASTRODUODENOSCOPY  07/23/2019   [place]    EUS  07/23/2019   [facility]     LAPAROSCOPIC LIVER CYST REMOVAL     OVARIAN CYST SURGERY     Family History  Problem Relation Age of Onset   Heart disease Father    Heart disease Brother 79   Heart disease Brother    Dementia Sister    Glaucoma Neg Hx    Colon cancer Neg Hx    Esophageal cancer Neg Hx    Pancreatic cancer Neg Hx    Stomach cancer Neg Hx    Rectal cancer Neg Hx    Colon polyps Neg  Hx    Social History   Socioeconomic History   Marital status: Married    Spouse name: Tom   Number of children: 1   Years of education: Not on file   Highest education level: Not on file  Occupational History   Occupation: retired    Comment: retired  Tobacco Use   Smoking status: Former    Packs/day: 0.30    Years: 15.00    Pack years: 4.50    Types: Cigarettes    Quit date: 11/17/1985    Years since quitting: 35.1   Smokeless tobacco: Never  Vaping Use   Vaping Use: Never used  Substance and Sexual Activity   Alcohol use: Yes    Alcohol/week: 14.0 standard drinks    Types: 14 Glasses of wine per week    Comment: 2 glasses per night   Drug use: No   Sexual activity: Not Currently    Partners: Male   Other Topics Concern   Not on file  Social History Narrative   Exercise- Walks about 15-22 miles per week with womens group.   Social Determinants of Health   Financial Resource Strain: Low Risk    Difficulty of Paying Living Expenses: Not hard at all  Food Insecurity: No Food Insecurity   Worried About Charity fundraiser in the Last Year: Never true   [place] in the Last Year: Never true  Transportation Needs: No Transportation Needs   Lack of Transportation (Medical): No   Lack of Transportation (Non-Medical): No  Physical Activity: Inactive   Days of Exercise per Week: 0 days   Minutes of Exercise per Session: 0 min  Stress: No Stress Concern Present   Feeling of Stress : Not at all  Social Connections: Moderately Integrated   Frequency of Communication with Friends and Family: More than three times a week   Frequency of Social Gatherings with Friends and Family: More than three times a week   Attends Religious Services: Never   Marine scientist or Organizations: Yes   Attends Music therapist: More than 4 times per year   Marital Status: Married    Tobacco Counseling Counseling given: Not Answered   Clinical Intake:  Pre-visit preparation completed: Yes  Pain : No/denies pain     BMI - recorded: 23.03 Nutritional Status: BMI of 19-24  Normal Nutritional Risks: None Diabetes: No  How often do you need to have someone help you when you read instructions, pamphlets, or other written materials from your doctor or pharmacy?: 1 - Never  Diabetic?No  Interpreter Needed?: No  Information entered by :: Caroleen Hamman LPn   Activities of Daily Living In your present state of health, do you have any difficulty performing the following activities: 01/13/2021 12/15/2020  Hearing? N N  Vision? N N  Difficulty concentrating or making decisions? N N  Walking or climbing stairs? N N  Dressing or bathing? N N  Doing errands, shopping? N N   Preparing Food and eating ? N -  Using the Toilet? N -  In the past six months, have you accidently leaked urine? Y -  Do you have problems with loss of bowel control? N -  Managing your Medications? N -  Managing your Finances? N -  Housekeeping or managing your Housekeeping? N -  Some recent data might be hidden    Patient Care Team: Carollee Herter, Alferd Apa, DO as PCP - General (Family Medicine) Larae Grooms  S, MD as PCP - Cardiology (Cardiology) Otelia Sergeant, OD as Referring Physician (Optometry) Barbaraann Barthel, Sharyn Lull, MD as Consulting Physician (Sports Medicine) Roseanne Kaufman, MD as Consulting Physician (Orthopedic Surgery) Jacolyn Reedy, MD as Consulting Physician (Cardiology) Harley Hallmark, MD as Referring Physician Jonetta Osgood, MD as Referring Physician (Gastroenterology)  Indicate any recent Medical Services you may have received from other than [facility] providers in the past year (date may be approximate).     Assessment:   This is a routine wellness examination for [place].  Hearing/Vision screen Hearing Screening - Comments:: No issues Vision Screening - Comments:: Last eye exam-12/2020-Dr. Oswaldo Conroy  Dietary issues and exercise activities discussed: Current Exercise Habits: The patient does not participate in regular exercise at present, Exercise limited by: None identified   Goals Addressed             This Visit's Progress    Increase physical activity         Depression Screen PHQ 2/9 Scores 01/13/2021 11/14/2020 12/10/2019 12/04/2018 11/29/2017 01/07/2017 11/18/2015  PHQ - 2 Score 1 0 0 0 0 0 0    Fall Risk Fall Risk  01/13/2021 11/14/2020 12/10/2019 12/04/2018 11/29/2017  Falls in the past year? 1 0 0 0 No  Number falls in past yr: 0 0 0 - -  Injury with Fall? 1 0 0 - -  Risk for fall due to : - No Fall Risks - - -  Follow up Falls prevention discussed Falls evaluation completed Falls evaluation completed - -    FALL RISK PREVENTION PERTAINING TO THE  HOME:  Any stairs in or around the home? Yes  If so, are there any without handrails? No  Home free of loose throw rugs in walkways, pet beds, electrical cords, etc? Yes  Adequate lighting in your home to reduce risk of falls? Yes   ASSISTIVE DEVICES UTILIZED TO PREVENT FALLS:  Life alert? No  Use of a cane, walker or w/c? No  Grab bars in the bathroom? Yes  Shower chair or bench in shower? Yes  Elevated toilet seat or a handicapped toilet? No   TIMED UP AND GO:  Was the test performed? Yes .  Length of time to ambulate 10 feet: 10 sec.   Gait steady and fast without use of assistive device  Cognitive Function:Normal cognitive status assessed by direct observation by this Nurse Health Advisor. No abnormalities found.   MMSE - Mini Mental State Exam 11/18/2015  Orientation to time 5  Orientation to Place 5  Registration 3  Attention/ Calculation 5  Recall 3  Language- name 2 objects 2  Language- repeat 1  Language- follow 3 step command 3  Language- read & follow direction 1  Write a sentence 1  Copy design 1  Total score 30        Immunizations Immunization History  Administered Date(s) Administered   Fluad Quad(high Dose 65+) 11/28/2018, 12/10/2019, 12/05/2020   Influenza, High Dose Seasonal PF 02/20/2016, 01/07/2017   Influenza,inj,Quad PF,6+ Mos 01/17/2015   Influenza-Unspecified 01/12/2018   PFIZER(Purple Top)SARS-COV-2 Vaccination 04/26/2019, 05/21/2019, 01/23/2020   Pfizer Covid-19 Vaccine Bivalent Booster 25yrs & up 12/08/2020   Pneumococcal Conjugate-13 01/26/2013, 01/17/2015   Pneumococcal Polysaccharide-23 06/23/2011, 04/27/2012   Pneumococcal-Unspecified 01/26/2013   Td 03/22/2009   Tdap 07/05/2019   Zoster Recombinat (Shingrix) 11/18/2017, 03/04/2018   Zoster, Live 03/22/2012    TDAP status: Up to date  Flu Vaccine status: Up to date  Pneumococcal vaccine status: Up to date  Covid-19 vaccine status: Completed vaccines  Qualifies for  Shingles Vaccine? No   Zostavax completed Yes   Shingrix Completed?: Yes  Screening Tests Health Maintenance  Topic Date Due   COLONOSCOPY (Pts 45-42yrs Insurance coverage will need to be confirmed)  02/09/2021   MAMMOGRAM  03/25/2021   DEXA SCAN  03/25/2022   TETANUS/TDAP  07/04/2029   Pneumonia Vaccine 34+ Years old  Completed   INFLUENZA VACCINE  Completed   COVID-19 Vaccine  Completed   Hepatitis C Screening  Completed   Zoster Vaccines- Shingrix  Completed   HPV VACCINES  Aged Out    Health Maintenance  There are no preventive care reminders to display for this patient.  Colorectal cancer screening: Type of screening: Colonoscopy. Completed 02/10/2016. Repeat every 5 years  Mammogram status: Completed bilateral 03/25/2020. Repeat every year  Bone Density status: Completed 03/25/2020. Results reflect: Bone density results: OSTEOPENIA. Repeat every 2 years.  Lung Cancer Screening: (Low Dose CT Chest recommended if Age 17-80 years, 30 pack-year currently smoking OR have quit w/in 15years.) does not qualify.     Additional Screening:  Hepatitis C Screening: Completed 05/19/2015  Vision Screening: Recommended annual ophthalmology exams for early detection of glaucoma and other disorders of the eye. Is the patient up to date with their annual eye exam?  Yes  Who is the provider or what is the name of the office in which the patient attends annual eye exams? Dr. Oswaldo Conroy   Dental Screening: Recommended annual dental exams for proper oral hygiene  Community Resource Referral / Chronic Care Management: CRR required this visit?  No   CCM required this visit?  No      Plan:     I have personally reviewed and noted the following in the patient's chart:   Medical and social history Use of alcohol, tobacco or illicit drugs  Current medications and supplements including opioid prescriptions.  Functional ability and status Nutritional status Physical activity Advanced  directives List of other physicians Hospitalizations, surgeries, and ER visits in previous 12 months Vitals Screenings to include cognitive, depression, and falls Referrals and appointments  In addition, I have reviewed and discussed with patient certain preventive protocols, quality metrics, and best practice recommendations. A written personalized care plan for preventive services as well as general preventive health recommendations were provided to patient.   Patient to access avs on mychart.  Marta Antu, LPN   [id number]  Nurse Health Advisor  Nurse Notes: None

## 2021-01-13 NOTE — Patient Instructions (Signed)
Kristen Schultz , Thank you for taking time to come for your Medicare Wellness Visit. I appreciate your ongoing commitment to your health goals. Please review the following plan we discussed and let me know if I can assist you in the future.   Screening recommendations/referrals: Colonoscopy: As we discussed today, please follow recommendations from GI regarding your next colonoscopy. Mammogram: Completed 03/25/2020-Due 03/25/2021 Bone Density: Completed 03/25/2020-Due 1.4.2024 Recommended yearly ophthalmology/optometry visit for glaucoma screening and checkup Recommended yearly dental visit for hygiene and checkup  Vaccinations: Influenza vaccine: Up to date Pneumococcal vaccine: Up to date Tdap vaccine: Up to date-Due-07/04/2029 Shingles vaccine: Completed vaccines   Covid-19:Up to date  Advanced directives: Copy in chart  Conditions/risks identified: See problem list  Next appointment: Follow up in one year for your annual wellness visit    Preventive Care 75 Years and Older, Female Preventive care refers to lifestyle choices and visits with your health care provider that can promote health and wellness. What does preventive care include? A yearly physical exam. This is also called an annual well check. Dental exams once or twice a year. Routine eye exams. Ask your health care provider how often you should have your eyes checked. Personal lifestyle choices, including: Daily care of your teeth and gums. Regular physical activity. Eating a healthy diet. Avoiding tobacco and drug use. Limiting alcohol use. Practicing safe sex. Taking low-dose aspirin every day. Taking vitamin and mineral supplements as recommended by your health care provider. What happens during an annual well check? The services and screenings done by your health care provider during your annual well check will depend on your age, overall health, lifestyle risk factors, and family history of disease. Counseling  Your  health care provider may ask you questions about your: Alcohol use. Tobacco use. Drug use. Emotional well-being. Home and relationship well-being. Sexual activity. Eating habits. History of falls. Memory and ability to understand (cognition). Work and work Statistician. Reproductive health. Screening  You may have the following tests or measurements: Height, weight, and BMI. Blood pressure. Lipid and cholesterol levels. These may be checked every 5 years, or more frequently if you are over 65 years old. Skin check. Lung cancer screening. You may have this screening every year starting at age 58 if you have a 30-pack-year history of smoking and currently smoke or have quit within the past 15 years. Fecal occult blood test (FOBT) of the stool. You may have this test every year starting at age 56. Flexible sigmoidoscopy or colonoscopy. You may have a sigmoidoscopy every 5 years or a colonoscopy every 10 years starting at age 44. Hepatitis C blood test. Hepatitis B blood test. Sexually transmitted disease (STD) testing. Diabetes screening. This is done by checking your blood sugar (glucose) after you have not eaten for a while (fasting). You may have this done every 1-3 years. Bone density scan. This is done to screen for osteoporosis. You may have this done starting at age 78. Mammogram. This may be done every 1-2 years. Talk to your health care provider about how often you should have regular mammograms. Talk with your health care provider about your test results, treatment options, and if necessary, the need for more tests. Vaccines  Your health care provider may recommend certain vaccines, such as: Influenza vaccine. This is recommended every year. Tetanus, diphtheria, and acellular pertussis (Tdap, Td) vaccine. You may need a Td booster every 10 years. Zoster vaccine. You may need this after age 56. Pneumococcal 13-valent conjugate (PCV13) vaccine. One  dose is recommended after age  21. Pneumococcal polysaccharide (PPSV23) vaccine. One dose is recommended after age 20. Talk to your health care provider about which screenings and vaccines you need and how often you need them. This information is not intended to replace advice given to you by your health care provider. Make sure you discuss any questions you have with your health care provider. Document Released: 04/04/2015 Document Revised: 11/26/2015 Document Reviewed: 01/07/2015 Elsevier Interactive Patient Education  2017 [place] Prevention in the Home Falls can cause injuries. They can happen to people of all ages. There are many things you can do to make your home safe and to help prevent falls. What can I do on the outside of my home? Regularly fix the edges of walkways and driveways and fix any cracks. Remove anything that might make you trip as you walk through a door, such as a raised step or threshold. Trim any bushes or trees on the path to your home. Use bright outdoor lighting. Clear any walking paths of anything that might make someone trip, such as rocks or tools. Regularly check to see if handrails are loose or broken. Make sure that both sides of any steps have handrails. Any raised decks and porches should have guardrails on the edges. Have any leaves, snow, or ice cleared regularly. Use sand or salt on walking paths during winter. Clean up any spills in your garage right away. This includes oil or grease spills. What can I do in the bathroom? Use night lights. Install grab bars by the toilet and in the tub and shower. Do not use towel bars as grab bars. Use non-skid mats or decals in the tub or shower. If you need to sit down in the shower, use a plastic, non-slip stool. Keep the floor dry. Clean up any water that spills on the floor as soon as it happens. Remove soap buildup in the tub or shower regularly. Attach bath mats securely with double-sided non-slip rug tape. Do not have throw  rugs and other things on the floor that can make you trip. What can I do in the bedroom? Use night lights. Make sure that you have a light by your bed that is easy to reach. Do not use any sheets or blankets that are too big for your bed. They should not hang down onto the floor. Have a firm chair that has side arms. You can use this for support while you get dressed. Do not have throw rugs and other things on the floor that can make you trip. What can I do in the kitchen? Clean up any spills right away. Avoid walking on wet floors. Keep items that you use a lot in easy-to-reach places. If you need to reach something above you, use a strong step stool that has a grab bar. Keep electrical cords out of the way. Do not use floor polish or wax that makes floors slippery. If you must use wax, use non-skid floor wax. Do not have throw rugs and other things on the floor that can make you trip. What can I do with my stairs? Do not leave any items on the stairs. Make sure that there are handrails on both sides of the stairs and use them. Fix handrails that are broken or loose. Make sure that handrails are as long as the stairways. Check any carpeting to make sure that it is firmly attached to the stairs. Fix any carpet that is loose or worn.  Avoid having throw rugs at the top or bottom of the stairs. If you do have throw rugs, attach them to the floor with carpet tape. Make sure that you have a light switch at the top of the stairs and the bottom of the stairs. If you do not have them, ask someone to add them for you. What else can I do to help prevent falls? Wear shoes that: Do not have high heels. Have rubber bottoms. Are comfortable and fit you well. Are closed at the toe. Do not wear sandals. If you use a stepladder: Make sure that it is fully opened. Do not climb a closed stepladder. Make sure that both sides of the stepladder are locked into place. Ask someone to hold it for you, if  possible. Clearly mark and make sure that you can see: Any grab bars or handrails. First and last steps. Where the edge of each step is. Use tools that help you move around (mobility aids) if they are needed. These include: Canes. Walkers. Scooters. Crutches. Turn on the lights when you go into a dark area. Replace any light bulbs as soon as they burn out. Set up your furniture so you have a clear path. Avoid moving your furniture around. If any of your floors are uneven, fix them. If there are any pets around you, be aware of where they are. Review your medicines with your doctor. Some medicines can make you feel dizzy. This can increase your chance of falling. Ask your doctor what other things that you can do to help prevent falls. This information is not intended to replace advice given to you by your health care provider. Make sure you discuss any questions you have with your health care provider. Document Released: 01/02/2009 Document Revised: 08/14/2015 Document Reviewed: 04/12/2014 Elsevier Interactive Patient Education  2017 [employer].

## 2021-02-10 ENCOUNTER — Other Ambulatory Visit: Payer: Self-pay | Admitting: Family Medicine

## 2021-02-10 DIAGNOSIS — Z1231 Encounter for screening mammogram for malignant neoplasm of breast: Secondary | ICD-10-CM

## 2021-03-04 ENCOUNTER — Telehealth: Payer: Self-pay | Admitting: Family Medicine

## 2021-03-04 NOTE — Telephone Encounter (Signed)
Pt called and stated that on 12/08 she fell in her home. She has bruising on her hips,head,elbows,knee and believes she broke her ribs. Transferred pt to triage for further advice due to having no appointments.

## 2021-03-04 NOTE — Telephone Encounter (Signed)
Nurse Assessment Nurse: Ottis Stain, RN, [place] Date/Time (Eastern Time): 03/04/2021 3:23:16 PM Confirm and document reason for call. If symptomatic, describe symptoms. ---Caller states fell Thursday afternoon after rushing thru house in stocking feet. Bruising from forehead down to knees. Having right rib and back pain along right side of spine (7/10). Also having pain on right side of abd. 7/10. States pain is a shearing type pain. States stomach feels puffy. Does not hurt to press on abd but it is more painful when upright. No vomiting, No diarrhea, No fever. Does the patient have any new or worsening symptoms? ---Yes Will a triage be completed? ---Yes Related visit to physician within the last 2 weeks? ---No Does the PT have any chronic conditions? (i.e. diabetes, asthma, this includes High risk factors for pregnancy, etc.) ---Yes List chronic conditions. ---HTN Is this a behavioral health or substance abuse call? ---No PLEASE NOTE: All timestamps contained within this report are represented as [place] Standard Time. CONFIDENTIALTY NOTICE: This fax transmission is intended only for the addressee. It contains information that is legally privileged, confidential or otherwise protected from use or disclosure. If you are not the intended recipient, you are strictly prohibited from reviewing, disclosing, copying using or disseminating any of this information or taking any action in reliance on or regarding this information. If you have received this fax in error, please notify us immediately by telephone so that we can arrange for its return to [place]. Phone: [phone], Toll-Free: [phone], Fax: [phone] Page: 2 of 3 Call Id: [id number] Guidelines Guideline Title Affirmed Question Affirmed Notes Nurse Date/Time Eilene Ghazi Time) Chest Injury [1] After 72 hours AND [2] chest pain not improving Ottis Stain, RN, [place] 03/04/2021 3:30:19 PM Abdominal Injury [1] Abdominal pain (not severe)  AND [2] present < 1 hour Ottis Stain, RN, [place] 03/04/2021 3:38:43 PM Abdominal Pain - Female [1] MODERATE pain (e.g., interferes with normal activities) AND [2] pain comes and goes (cramps) AND [3] present > 24 hours (Exception: pain with Vomiting or Diarrhea - see that Guideline) Ottis Stain, [place], [place] 03/04/2021 3:44:00 PM Disp. Time Eilene Ghazi Time) Disposition Final User 03/04/2021 3:20:27 PM Send to Urgent [place] Jeans 03/04/2021 3:38:08 PM SEE PCP WITHIN 3 DAYS Ottis Stain RN, [place] 03/04/2021 3:47:27 PM See PCP within 24 Hours Yes Ottis Stain, RN, [place] Caller Disagree/Comply Comply Caller Understands Yes PreDisposition [place] Advice Given Per Guideline SEE PCP WITHIN 3 DAYS: * You need to be seen within 2 or 3 days. USE HEAT ON AREA AFTER 48 HOURS: * If pain, swelling, or bruising last more than 48 hours (2 days), then use heat on the area. * Use a heat pack, heating pad, or warm wet washcloth. * Do this for 10 minutes three times a day. * This will help increase blood flow and improve healing. LIMIT ACTIVITIES: * Avoid strenuous activities or contact sports until the pain is gone. BREATHING EXERCISES: * Take 2 deep breaths and then cough once each hour while awake. Use a pillow over the injured area to reduce the pain of this activity. * This will make certain that you get good air movement to all parts of your lungs and help prevent pneumonia. CALL BACK IF: * You become worse PLEASE NOTE: All timestamps contained within this report are represented as [place] Standard Time. CONFIDENTIALTY NOTICE: This fax transmission is intended only for the addressee. It contains information that is legally privileged, confidential or otherwise protected from use or disclosure. If you are not the intended recipient, you are strictly prohibited from  reviewing, disclosing, copying using or disseminating any of this information or taking any action in reliance on or regarding this  information. If you have received this fax in error, please notify us immediately by telephone so that we can arrange for its return to [place]. Phone: [phone], Toll-Free: [phone], Fax: [phone] Page: 3 of 3 Call Id: [id number] Care Advice Given Per Guideline SEE PCP WITHIN 24 HOURS: * IF OFFICE WILL BE OPEN: You need to be examined within the next 24 hours. Call your doctor (or NP/PA) when the office opens and make an appointment. CALL BACK IF: * Severe pain lasts over 1 hour * Constant pain lasts over 2 hours * You become worse Comments User: Evlyn Clines, RN Date/Time Eilene Ghazi Time): 03/04/2021 3:33:06 PM Fluctuates between a 4-7/10 User: Evlyn Clines, RN Date/Time Eilene Ghazi Time): 03/04/2021 3:45:12 PM Abdominal pain is sharp when breaths. Between belly button and rib cage. Landed on right side of abd. User: Evlyn Clines, RN Date/Time (Eastern Time): 03/04/2021 3:46:04 PM Abd pain is 4-6/10 but pain fleeting but sharp Referrals REFERRED TO PCP OFFICE

## 2021-03-04 NOTE — Telephone Encounter (Signed)
Patient scheduled tomorrow at [facility] as we do not have any openings at this time.

## 2021-03-05 ENCOUNTER — Ambulatory Visit (INDEPENDENT_AMBULATORY_CARE_PROVIDER_SITE_OTHER): Payer: Medicare HMO | Admitting: Family Medicine

## 2021-03-05 ENCOUNTER — Other Ambulatory Visit: Payer: Self-pay

## 2021-03-05 ENCOUNTER — Encounter: Payer: Self-pay | Admitting: Family Medicine

## 2021-03-05 VITALS — BP 160/61 | HR 68 | Temp 97.8°F | Resp 16 | Ht 65.0 in | Wt 142.6 lb

## 2021-03-05 DIAGNOSIS — R0781 Pleurodynia: Secondary | ICD-10-CM

## 2021-03-05 DIAGNOSIS — S8001XA Contusion of right knee, initial encounter: Secondary | ICD-10-CM | POA: Diagnosis not present

## 2021-03-05 DIAGNOSIS — S7001XA Contusion of right hip, initial encounter: Secondary | ICD-10-CM | POA: Diagnosis not present

## 2021-03-05 DIAGNOSIS — R1011 Right upper quadrant pain: Secondary | ICD-10-CM

## 2021-03-05 DIAGNOSIS — W19XXXA Unspecified fall, initial encounter: Secondary | ICD-10-CM

## 2021-03-05 DIAGNOSIS — Z8781 Personal history of (healed) traumatic fracture: Secondary | ICD-10-CM

## 2021-03-05 DIAGNOSIS — S0083XA Contusion of other part of head, initial encounter: Secondary | ICD-10-CM | POA: Diagnosis not present

## 2021-03-05 DIAGNOSIS — Y92009 Unspecified place in unspecified non-institutional (private) residence as the place of occurrence of the external cause: Secondary | ICD-10-CM

## 2021-03-05 DIAGNOSIS — M549 Dorsalgia, unspecified: Secondary | ICD-10-CM

## 2021-03-05 NOTE — Patient Instructions (Addendum)
I am glad to hear that the head injury, hip, knee areas are improved.  I suspect you have a rib contusion but will have x-rays performed to rule out fracture, as well as a CT scan to evaluate your liver and abdominal swelling/pain.  Tylenol, heat or ice to affected areas is fine for now.  If any increasing or worsening symptoms be seen right away.  I will let you know once I receive results of your studies.  Chest Wall Pain Chest wall pain is pain in or around the bones and muscles of your chest. Sometimes, an injury causes this pain. Excessive coughing or overuse of arm and chest muscles may also cause chest wall pain. Sometimes, the cause may not be known. This pain may take several weeks or longer to get better. Follow these instructions at home: Managing pain, stiffness, and swelling  If directed, put ice on the painful area: Put ice in a plastic bag. Place a towel between your skin and the bag. Leave the ice on for 20 minutes, 2-3 times per day. Activity Rest as told by your health care provider. Avoid activities that cause pain. These include any activities that use your chest muscles or your abdominal and side muscles to lift heavy items. Ask your health care provider what activities are safe for you. General instructions  Take over-the-counter and prescription medicines only as told by your health care provider. Do not use any products that contain nicotine or tobacco, such as cigarettes, e-cigarettes, and chewing tobacco. These can delay healing after injury. If you need help quitting, ask your health care provider. Keep all follow-up visits as told by your health care provider. This is important. Contact a health care provider if: You have a fever. Your chest pain becomes worse. You have new symptoms. Get help right away if: You have nausea or vomiting. You feel sweaty or light-headed. You have a cough with mucus from your lungs (sputum) or you cough up blood. You develop  shortness of breath. These symptoms may represent a serious problem that is an emergency. Do not wait to see if the symptoms will go away. Get medical help right away. Call your local emergency services (911 in the [place] Do not drive yourself to the hospital. Summary Chest wall pain is pain in or around the bones and muscles of your chest. Depending on the cause, it may be treated with ice, rest, medicines, and avoiding activities that cause pain. Contact a health care provider if you have a fever, worsening chest pain, or new symptoms. Get help right away if you feel light-headed or you develop shortness of breath. These symptoms may be an emergency. This information is not intended to replace advice given to you by your health care provider. Make sure you discuss any questions you have with your health care provider. Document Revised: 05/23/2020 Document Reviewed: 05/23/2020 Elsevier Patient Education  [place].  Contusion A contusion is a deep bruise. Contusions are the result of a blunt injury to tissues and muscle fibers under the skin. The injury causes bleeding under the skin. The skin overlying the contusion may turn blue, purple, or yellow. Minor injuries will give you a painless contusion, but more severe injuries cause contusions that may stay painful and swollen for a few weeks. Follow these instructions at home: Pay attention to any changes in your symptoms. Let your health care provider know about them. Take these actions to relieve your pain. Managing pain, stiffness, and swelling  Use resting, icing, applying pressure (compression), and raising (elevating) the injured area. This is often called the RICE strategy. Rest the injured area. Return to your normal activities as told by your health care provider. Ask your health care provider what activities are safe for you. If directed, put ice on the injured area: Put ice in a plastic bag. Place a towel between your skin  and the bag. Leave the ice on for 20 minutes, 2-3 times per day. If directed, apply light compression to the injured area using an elastic bandage. Make sure the bandage is not wrapped too tightly. Remove and reapply the bandage as directed by your health care provider. If possible, raise (elevate) the injured area above the level of your heart while you are sitting or lying down. General instructions Take over-the-counter and prescription medicines only as told by your health care provider. Keep all follow-up visits as told by your health care provider. This is important. Contact a health care provider if: Your symptoms do not improve after several days of treatment. Your symptoms get worse. You have difficulty moving the injured area. Get help right away if: You have severe pain. You have numbness in a hand or foot. Your hand or foot turns pale or cold. Summary A contusion is a deep bruise. Contusions are the result of a blunt injury to tissues and muscle fibers under the skin. It is treated with rest, ice, compression, and elevation. You may be given over-the-counter medicines for pain. Contact a health care provider if your symptoms do not improve, or get worse. Get help right away if you have severe pain, have numbness, or the area turns pale or cold. This information is not intended to replace advice given to you by your health care provider. Make sure you discuss any questions you have with your health care provider. Document Revised: 10/27/2017 Document Reviewed: 10/27/2017 Elsevier Patient Education  2022 [employer].     If you have lab work done today you will be contacted with your lab results within the next 2 weeks.  If you have not heard from [place] then please contact us. The fastest way to get your results is to register for My Chart.   IF you received an x-ray today, you will receive an invoice from [facility]. Please contact [facility] at  [phone] with questions or concerns regarding your invoice.   IF you received labwork today, you will receive an invoice from [place]. Please contact [facility] at [phone] with questions or concerns regarding your invoice.   Our billing staff will not be able to assist you with questions regarding bills from these companies.  You will be contacted with the lab results as soon as they are available. The fastest way to get your results is to activate your My Chart account. Instructions are located on the last page of this paperwork. If you have not heard from [place] regarding the results in 2 weeks, please contact this office.

## 2021-03-05 NOTE — Progress Notes (Signed)
Subjective:  Patient ID: Kristen Schultz, female    DOB: 05-18-1945  Age: 75 y.o. MRN: [id number]  CC:  Chief Complaint  Patient presents with   Fall    Patient states she fell last Thursday and her both of her ribs in the front and back and also some right side stomach pain and is a little swollen. Patient states she is having some trouble taking breaths because of the pain and is using a heating pad and icy hot.    HPI [employer] presents for   Fall at home.  DOI: 1 week ago, 02/26/2021. At home.  Was rushing through house, carrying items, socks on feet slipped on hardwood floor. Landed on left forehead, R forehead, R knee, R hip, mid spine of upper back, lower front ribs on r side, and R upper abdomen felt swollen few days after injury.  Has old compression fx from horse injury in past.  No dyspnea. No hemoptysis.  No LOC, no headaches, vision changes.  Able to wieght bear without pain on hip/knee.  Back to baseline of R hip soreness. R knee pain resolved, no pain with with WB and full function of hip/knee.  Back and abd/side are persistent areas of soreness. No blood in stool. Normal BM no blood in stool, no blood in urine. No incontinence.  Not on blood thinners.  Naproxen, advil, tylenol. Ice on back and front. Changed to heat recently.   Treatment: heating pad, icy hot.   History Patient Active Problem List   Diagnosis Date Noted   Skin rash 12/31/2020   Dysuria 12/31/2020   Hyperlipidemia 12/15/2020   B12 deficiency 12/10/2019   Palpitation 12/10/2019   Polyp of colon 12/10/2019   Lower abdominal pain 12/10/2019   Adjustment disorder with mixed anxiety and depressed mood 12/10/2019   Bradycardia 12/10/2019   Elevated amylase 05/24/2018   Gastroesophageal reflux disease 05/24/2018   Preventative health care 05/24/2018   Elevated rheumatoid factor 05/24/2018   Right shoulder pain 10/06/2015   Generalized anxiety disorder 11/11/2014   Essential  hypertension 11/11/2014   Past Medical History:  Diagnosis Date   Anxiety    Cellulitis of chin 11/17/2020   Colon polyps    COVID-19 11/17/2020   Gastric ulcer    per patient; in the 1970s   H. pylori infection    Heart murmur    Hyperlipidemia    no treatment per pt   Hypertension    Liver cyst    Ovarian cyst 1984   Pneumonia    Past Surgical History:  Procedure Laterality Date   COLONOSCOPY  02/10/2016   DENTAL SURGERY  2000   dental implant   DENTAL SURGERY  2021   2 molars taken out    ESOPHAGOGASTRODUODENOSCOPY  11/06/2018   [facility]    ESOPHAGOGASTRODUODENOSCOPY  07/23/2019   [facility]    EUS  07/23/2019   [facility] LIVER CYST REMOVAL     OVARIAN CYST SURGERY     Allergies  Allergen Reactions   Bee Venom Anaphylaxis    Bee stings   Codeine     Extreme Vomiting   Other Other (See Comments)    CDN - reaction unknown   Penicillins     Redness, swelling and itching Can take Keflex   Tetracyclines & Related     Yeast Infection and Stomach problems   Xylocaine Dental [Lidocaine-Epinephrine]  Prior to Admission medications   Medication Sig Start Date End Date Taking? Authorizing Provider  Cholecalciferol (VITAMIN D-3) 125 MCG (5000 UT) TABS Take 1 tablet by mouth daily.    [provider]  citalopram (CELEXA) 10 MG tablet TAKE 1 TABLET BY MOUTH EVERY DAY 12/04/20   Carollee Herter, Alferd Apa, DO  cyanocobalamin 100 MCG tablet Take by mouth.    [provider]  famotidine (PEPCID) 20 MG tablet TAKE 1 TABLET BY MOUTH TWICE A DAY 12/10/20   Carollee Herter, Yvonne R, DO  losartan (COZAAR) 50 MG tablet TAKE 1 TABLET BY MOUTH EVERY DAY 12/30/20   [name], DO  Multiple Vitamin (MULTIVITAMIN) capsule Take 1 capsule by mouth daily.    [provider]  mupirocin ointment (BACTROBAN) 2 % Apply 1 application topically 2 (two) times daily. 11/14/20   Marrian Salvage, FNP   Social History   Socioeconomic History   Marital status: Married    Spouse name: Tom   Number of children: 1   Years of education: Not on file   Highest education level: Not on file  Occupational History   Occupation: retired    Comment: retired  Tobacco Use   Smoking status: Former    Packs/day: 0.30    Years: 15.00    Pack years: 4.50    Types: Cigarettes    Quit date: 11/17/1985    Years since quitting: 35.3   Smokeless tobacco: Never  Vaping Use   Vaping Use: Never used  Substance and Sexual Activity   Alcohol use: Yes    Alcohol/week: 14.0 standard drinks    Types: 14 Glasses of wine per week    Comment: 2 glasses per night   Drug use: No   Sexual activity: Not Currently    Partners: Male  Other Topics Concern   Not on file  Social History Narrative   Exercise- Walks about 15-22 miles per week with womens group.   Social Determinants of Health   Financial Resource Strain: Low Risk    Difficulty of Paying Living Expenses: Not hard at all  Food Insecurity: No Food Insecurity   Worried About Charity fundraiser in the Last Year: Never true   Marianne in the Last Year: Never true  Transportation Needs: No Transportation Needs   Lack of Transportation (Medical): No   Lack of Transportation (Non-Medical): No  Physical Activity: Inactive   Days of Exercise per Week: 0 days   Minutes of Exercise per Session: 0 min  Stress: No Stress Concern Present   Feeling of Stress : Not at all  Social Connections: Moderately Integrated   Frequency of Communication with Friends and Family: More than three times a week   Frequency of Social Gatherings with Friends and Family: More than three times a week   Attends Religious Services: Never   Marine scientist or Organizations: Yes   Attends Music therapist: More than 4 times per year   Marital Status: Married  Human resources officer Violence: Not At Risk   Fear of Current or Ex-Partner: No    Emotionally Abused: No   Physically Abused: No   Sexually Abused: No    Review of Systems Per HPI.   Objective:   Vitals:   03/05/21 1401  BP: (!) 160/61  Pulse: 68  Resp: 16  Temp: 97.8 F (36.6 C)  TempSrc: Temporal  SpO2: 98%  Weight: 142 lb 9.6 oz (64.7 kg)  Height: 5\' 5"  (1.651 m)     Physical Exam Constitutional:      General: She is not in acute distress.    Appearance: Normal appearance. She is well-developed.  HENT:     Head: Normocephalic.   Cardiovascular:     Rate and Rhythm: Normal rate.  Pulmonary:     Effort: Pulmonary effort is normal. No respiratory distress.     Breath sounds: No stridor. No rhonchi or rales.     Comments: Tender to palpation over lower rib margin left anterior lower ribs, but primarily right lower anterior ribs, lateral rib margin.  No crepitance.  Skin intact without ecchymosis.  Equal breath sounds and full throughout. Chest:     Chest wall: Tenderness present.  Abdominal:     General: There is no distension.     Tenderness: There is abdominal tenderness.     Comments: Slight tender to palpation right upper quadrant with fullness, above well-healed scar.  Skin intact without ecchymosis.  Negative Pascal Lux Turner signs.   Musculoskeletal:     Comments: C spine - no midline bony ttp, pain free rom.   T spine: ttp lower midline, slight ttp left paraspinals.   LS spine - nontender   R hip: pain free IR/ER, nontender.   R Knee: small healing abrasion, no effusion, pain free rom.   Skin:    General: Skin is warm and dry.     Findings: No bruising or rash.  Neurological:     Mental Status: She is alert and oriented to person, place, and time.  Psychiatric:        Mood and Affect: Mood normal.   50 minutes spent during visit, including chart review, counseling and assimilation of information, exam and discussion of multiple injuries, coordination of imaging studies,  discussion of plan, and chart completion.   Lab  Results  Component Value Date   CREATININE 0.68 12/15/2020     Assessment & Plan:  Shadiamond Koska is a 75 y.o. female . Fall in home, initial encounter - Plan: DG Ribs Bilateral W/Chest, DG Thoracic Spine 2 View, CT Abdomen Pelvis W Contrast, CANCELED: CT Abdomen Pelvis W Contrast  Contusion of other part of head, initial encounter  Contusion of right knee, initial encounter  Contusion of right hip, initial encounter  Mid back pain  History of vertebral compression fracture - Plan: DG Thoracic Spine 2 View  Rib pain on right side - Plan: DG Ribs Bilateral W/Chest  RUQ abdominal pain - Plan: CT Abdomen Pelvis W Contrast, CANCELED: CT Abdomen Pelvis W Contrast  Rib pain on left side - Plan: DG Ribs Bilateral W/Chest  Mechanical fall above.  Suspected initially with facial/head contusion, right hip and right knee contusion with small healing abrasion.  All those areas are improving.  Denies headaches or vision changes.  Deferred imaging of those areas at this time.  Has had persistent right-sided greater than left-sided rib pain right upper quadrant pain with swelling, and mid back pain.  Prior compression fracture.  Check x-ray of thoracic spine, bilateral rib series to rule out fracture, but based on location of pain and possible lower rib fracture along with right upper quadrant pain and swelling will check CT to rule out hepatic injury.  Hemodynamically stable, reassuring exam otherwise.  ER precautions given.  Continue Tylenol, heat, ice affected areas, can prescribe stronger medication if needed.  No orders of the defined types were placed in this encounter.  Patient Instructions  I am glad to hear that the head injury, hip, knee areas are improved.  I suspect you have a rib contusion but will have x-rays performed to rule out fracture, as well as a CT scan to evaluate your liver and abdominal swelling/pain.  Tylenol, heat or ice to affected areas is fine for now.  If any  increasing or worsening symptoms be seen right away.  I will let you know once I receive results of your studies.  Chest Wall Pain Chest wall pain is pain in or around the bones and muscles of your chest. Sometimes, an injury causes this pain. Excessive coughing or overuse of arm and chest muscles may also cause chest wall pain. Sometimes, the cause may not be known. This pain may take several weeks or longer to get better. Follow these instructions at home: Managing pain, stiffness, and swelling  If directed, put ice on the painful area: Put ice in a plastic bag. Place a towel between your skin and the bag. Leave the ice on for 20 minutes, 2-3 times per day. Activity Rest as told by your health care provider. Avoid activities that cause pain. These include any activities that use your chest muscles or your abdominal and side muscles to lift heavy items. Ask your health care provider what activities are safe for you. General instructions  Take over-the-counter and prescription medicines only as told by your health care provider. Do not use any products that contain nicotine or tobacco, such as cigarettes, e-cigarettes, and chewing tobacco. These can delay healing after injury. If you need help quitting, ask your health care provider. Keep all follow-up visits as told by your health care provider. This is important. Contact a health care provider if: You have a fever. Your chest pain becomes worse. You have new symptoms. Get help right away if: You have nausea or vomiting. You feel sweaty or light-headed. You have a cough with mucus from your lungs (sputum) or you cough up blood. You develop shortness of breath. These symptoms may represent a serious problem that is an emergency. Do not wait to see if the symptoms will go away. Get medical help right away. Call your local emergency services (911 in the [place] Do not drive yourself to the hospital. Summary Chest wall pain is pain in or  around the bones and muscles of your chest. Depending on the cause, it may be treated with ice, rest, medicines, and avoiding activities that cause pain. Contact a health care provider if you have a fever, worsening chest pain, or new symptoms. Get help right away if you feel light-headed or you develop shortness of breath. These symptoms may be an emergency. This information is not intended to replace advice given to you by your health care provider. Make sure you discuss any questions you have with your health care provider. Document Revised: 05/23/2020 Document Reviewed: 05/23/2020 Elsevier Patient Education  [place].  Contusion A contusion is a deep bruise. Contusions are the result of a blunt injury to tissues and muscle fibers under the skin. The injury causes bleeding under the skin. The skin overlying the contusion may turn blue, purple, or yellow. Minor injuries will give you a painless contusion, but more severe injuries cause contusions that may stay painful and swollen for a few weeks. Follow these instructions at home: Pay attention to any changes in your symptoms. Let your health care provider know about them. Take these actions to relieve your pain. Managing pain, stiffness, and swelling  Use resting, icing, applying pressure (compression), and raising (elevating) the injured area. This is often called the RICE strategy. Rest the injured area. Return to your normal activities as told by your health care provider. Ask your health care provider what activities are safe for you. If directed, put ice on the injured area: Put ice in a plastic bag. Place a towel between your skin and the bag. Leave the ice on for 20 minutes, 2-3 times per day. If directed, apply light compression to the injured area using an elastic bandage. Make sure the bandage is not wrapped too tightly. Remove and reapply the bandage as directed by your health care provider. If possible, raise (elevate)  the injured area above the level of your heart while you are sitting or lying down. General instructions Take over-the-counter and prescription medicines only as told by your health care provider. Keep all follow-up visits as told by your health care provider. This is important. Contact a health care provider if: Your symptoms do not improve after several days of treatment. Your symptoms get worse. You have difficulty moving the injured area. Get help right away if: You have severe pain. You have numbness in a hand or foot. Your hand or foot turns pale or cold. Summary A contusion is a deep bruise. Contusions are the result of a blunt injury to tissues and muscle fibers under the skin. It is treated with rest, ice, compression, and elevation. You may be given over-the-counter medicines for pain. Contact a health care provider if your symptoms do not improve, or get worse. Get help right away if you have severe pain, have numbness, or the area turns pale or cold. This information is not intended to replace advice given to you by your health care provider. Make sure you discuss any questions you have with your health care provider. Document Revised: 10/27/2017 Document Reviewed: 10/27/2017 Elsevier Patient Education  2022 [employer].     If you have lab work done today you will be contacted with your lab results within the next 2 weeks.  If you have not heard from [place] then please contact us. The fastest way to get your results is to register for My Chart.   IF you received an x-ray today, you will receive an invoice from [facility]. Please contact [facility] at [phone] with questions or concerns regarding your invoice.   IF you received labwork today, you will receive an invoice from [facility]. Please contact [facility] at [phone] with questions or concerns regarding your invoice.   Our billing staff will not be able to assist you with questions regarding  bills from these companies.  You will be contacted with the lab results as soon as they are available. The fastest way to get your results is to activate your My Chart account. Instructions are located on the last page of this paperwork. If you have not heard from [place] regarding the results in 2 weeks, please contact this office.       Signed,   Merri Ray, MD [place] 03/05/21 3:16 PM

## 2021-03-06 ENCOUNTER — Ambulatory Visit (HOSPITAL_BASED_OUTPATIENT_CLINIC_OR_DEPARTMENT_OTHER)
Admission: RE | Admit: 2021-03-06 | Discharge: 2021-03-06 | Disposition: A | Discharge status: Home / Self Care | Payer: Medicare HMO | Source: Ambulatory Visit | Attending: Family Medicine | Admitting: Family Medicine

## 2021-03-06 DIAGNOSIS — M546 Pain in thoracic spine: Secondary | ICD-10-CM | POA: Diagnosis not present

## 2021-03-06 DIAGNOSIS — R1011 Right upper quadrant pain: Secondary | ICD-10-CM | POA: Insufficient documentation

## 2021-03-06 DIAGNOSIS — Y92009 Unspecified place in unspecified non-institutional (private) residence as the place of occurrence of the external cause: Secondary | ICD-10-CM | POA: Insufficient documentation

## 2021-03-06 DIAGNOSIS — W19XXXA Unspecified fall, initial encounter: Secondary | ICD-10-CM | POA: Insufficient documentation

## 2021-03-06 DIAGNOSIS — Z8781 Personal history of (healed) traumatic fracture: Secondary | ICD-10-CM | POA: Diagnosis not present

## 2021-03-06 DIAGNOSIS — K5730 Diverticulosis of large intestine without perforation or abscess without bleeding: Secondary | ICD-10-CM | POA: Insufficient documentation

## 2021-03-06 DIAGNOSIS — R0781 Pleurodynia: Secondary | ICD-10-CM

## 2021-03-06 DIAGNOSIS — Z043 Encounter for examination and observation following other accident: Secondary | ICD-10-CM | POA: Diagnosis not present

## 2021-03-06 DIAGNOSIS — I700 Atherosclerosis of aorta: Secondary | ICD-10-CM | POA: Diagnosis not present

## 2021-03-06 LAB — POCT I-STAT CREATININE: Creatinine, Ser: 0.7 mg/dL (ref 0.44–1.00)

## 2021-03-06 MED ORDER — IOHEXOL 300 MG/ML  SOLN
100.0000 mL | Freq: Once | INTRAMUSCULAR | Status: AC | PRN
  Administered 2021-03-06: 100 mL via INTRAVENOUS

## 2021-03-09 ENCOUNTER — Encounter: Payer: Self-pay | Admitting: Family Medicine

## 2021-03-09 MED ORDER — TRAMADOL HCL 50 MG PO TABS
50.0000 mg | ORAL_TABLET | Freq: Four times a day (QID) | ORAL | 0 refills | Status: DC | PRN
Start: 2021-03-09 — End: 2021-03-18

## 2021-03-17 ENCOUNTER — Telehealth: Payer: Self-pay

## 2021-03-17 NOTE — Telephone Encounter (Signed)
Caller name:Kristen Schultz   On DPR? :Yes  Call back number:[phone]  Provider they see: Carlota Raspberry  Reason for call:Pt is calling back she seen Dr Carlota Raspberry regarding a fall 12/15 and she is wanting to know if she can get another refill on traMADol (ULTRAM) 50 MG tablet  [id number] - [place], [place] - [address]

## 2021-03-18 MED ORDER — TRAMADOL HCL 50 MG PO TABS
50.0000 mg | ORAL_TABLET | Freq: Four times a day (QID) | ORAL | 0 refills | Status: DC | PRN
Start: 2021-03-18 — End: 2021-06-09

## 2021-03-18 NOTE — Telephone Encounter (Signed)
Refilled, but please schedule visit with myself or her PCP in the next week to follow-up on her pain/injuries.

## 2021-03-19 ENCOUNTER — Telehealth: Payer: Self-pay

## 2021-03-19 NOTE — Telephone Encounter (Signed)
Called and spoke with the patient she said she will call and schedule a appointment because she is still in too much pain to drive and really move around.

## 2021-03-20 NOTE — Telephone Encounter (Signed)
I have spoke with patient she made appt on January 11th at 1:00

## 2021-04-02 ENCOUNTER — Encounter: Payer: Self-pay | Admitting: Family Medicine

## 2021-04-02 ENCOUNTER — Ambulatory Visit (INDEPENDENT_AMBULATORY_CARE_PROVIDER_SITE_OTHER): Payer: Medicare HMO | Admitting: Family Medicine

## 2021-04-02 VITALS — BP 166/84 | HR 81 | Temp 98.3°F | Resp 17 | Ht 65.0 in | Wt 140.6 lb

## 2021-04-02 DIAGNOSIS — R0781 Pleurodynia: Secondary | ICD-10-CM

## 2021-04-02 DIAGNOSIS — M549 Dorsalgia, unspecified: Secondary | ICD-10-CM | POA: Diagnosis not present

## 2021-04-02 DIAGNOSIS — W19XXXD Unspecified fall, subsequent encounter: Secondary | ICD-10-CM

## 2021-04-02 DIAGNOSIS — Y92009 Unspecified place in unspecified non-institutional (private) residence as the place of occurrence of the external cause: Secondary | ICD-10-CM

## 2021-04-02 DIAGNOSIS — Z8781 Personal history of (healed) traumatic fracture: Secondary | ICD-10-CM | POA: Diagnosis not present

## 2021-04-02 MED ORDER — CALCITONIN (SALMON) 200 UNIT/ACT NA SOLN: 1.0000 | Freq: Every day | NASAL | 12 refills | Status: DC

## 2021-04-02 NOTE — Progress Notes (Signed)
Subjective:  Patient ID: Kristen Schultz, female    DOB: Nov 01, 1945  Age: 76 y.o. MRN: [id number]  CC:  Chief Complaint  Patient presents with   Follow-up    Patient states she is here for a follow up on back pain and fall she had. Pt would like to discuss results from imaging. She is still having pain.    HPI [name] presents for  Follow-up.  PCP [name], DO next physical in March.   I saw her for acute visit December 15 after a fall at home approximately December 8.  Multiple areas of pain at that time including rib pain bilaterally, right upper quad abdominal pain, mid back pain.  Treated with tramadol for pain control, imaging performed -CT abdomen/pelvis indicated sigmoid diverticulosis without inflammation, aortic atherosclerosis but no acute definite acute abnormality seen in the abdomen or pelvis.  Rib series with no evidence of acute rib fracture but chronic appearing fractures of the left third through fifth lateral ribs.  Multiple fairly subtle thoracic and upper lumbar wedge deformities at T8, 9 and L1, L2.  Age indeterminate.  Option of MRI to eval further. At the time she decided against further imaging   Still with mid back pain, right side. Lower R back better if on meds, worse if coming off nsaids. Alternating tylenol, advil, aspirin. Heating pad. Having to lie down about 2 hours per day due to catching sensation in back - prior 5 hours needed.  Stopped tramadol d/t constipation, felt weird on that med too - took 1/2 pill at a time. Had to take suppository.  No new weakness, no incontinence.   Rib pain in front at times - comes and goes. Better than last visit. Still sore, catching pain with deep breath. No dyspnea, no hemoptysis.  Occasional bloating in abdomen - no new pain. No fever.   Has led women's walking group for years - limited walking d/t this fall.  BMD scheduled next Thursday.    History Patient Active Problem List   Diagnosis  Date Noted   Skin rash 12/31/2020   Dysuria 12/31/2020   Hyperlipidemia 12/15/2020   B12 deficiency 12/10/2019   Palpitation 12/10/2019   Polyp of colon 12/10/2019   Lower abdominal pain 12/10/2019   Adjustment disorder with mixed anxiety and depressed mood 12/10/2019   Bradycardia 12/10/2019   Elevated amylase 05/24/2018   Gastroesophageal reflux disease 05/24/2018   Preventative health care 05/24/2018   Elevated rheumatoid factor 05/24/2018   Right shoulder pain 10/06/2015   Generalized anxiety disorder 11/11/2014   Essential hypertension 11/11/2014   Past Medical History:  Diagnosis Date   Anxiety    Cellulitis of chin 11/17/2020   Colon polyps    COVID-19 11/17/2020   Gastric ulcer    per patient; in the 1970s   H. pylori infection    Heart murmur    Hyperlipidemia    no treatment per pt   Hypertension    Liver cyst    Ovarian cyst 1984   Pneumonia    Past Surgical History:  Procedure Laterality Date   COLONOSCOPY  02/10/2016   DENTAL SURGERY  2000   dental implant   DENTAL SURGERY  2021   2 molars taken out    ESOPHAGOGASTRODUODENOSCOPY  11/06/2018   [facility]    ESOPHAGOGASTRODUODENOSCOPY  07/23/2019   [place]    EUS  07/23/2019   [place]  LAPAROSCOPIC LIVER CYST REMOVAL     OVARIAN CYST SURGERY     Allergies  Allergen Reactions   Bee Venom Anaphylaxis    Bee stings   Codeine     Extreme Vomiting   Other Other (See Comments)    CDN - reaction unknown   Penicillins     Redness, swelling and itching Can take Keflex   Tetracyclines & Related     Yeast Infection and Stomach problems   Xylocaine Dental [Lidocaine-Epinephrine]    Prior to Admission medications   Medication Sig Start Date End Date Taking? Authorizing Provider  Cholecalciferol (VITAMIN D-3) 125 MCG (5000 UT) TABS Take 1 tablet by mouth daily.    [provider]  citalopram (CELEXA) 10 MG tablet TAKE 1 TABLET BY MOUTH  EVERY DAY 12/04/20   Carollee Herter, Alferd Apa, DO  cyanocobalamin 100 MCG tablet Take by mouth.    [provider]  famotidine (PEPCID) 20 MG tablet TAKE 1 TABLET BY MOUTH TWICE A DAY Patient not taking: Reported on 03/05/2021 12/10/20   Carollee Herter, Kendrick Fries R, DO  losartan (COZAAR) 50 MG tablet TAKE 1 TABLET BY MOUTH EVERY DAY 12/30/20   [name], DO  Multiple Vitamin (MULTIVITAMIN) capsule Take 1 capsule by mouth daily.    [provider]  mupirocin ointment (BACTROBAN) 2 % Apply 1 application topically 2 (two) times daily. 11/14/20   Marrian Salvage, FNP  traMADol (ULTRAM) 50 MG tablet Take 1 tablet (50 mg total) by mouth every 6 (six) hours as needed. 03/18/21   Wendie Agreste, MD   Social History   Socioeconomic History   Marital status: Married    Spouse name: Tom   Number of children: 1   Years of education: Not on file   Highest education level: Not on file  Occupational History   Occupation: retired    Comment: retired  Tobacco Use   Smoking status: Former    Packs/day: 0.30    Years: 15.00    Pack years: 4.50    Types: Cigarettes    Quit date: 11/17/1985    Years since quitting: 35.3   Smokeless tobacco: Never  Vaping Use   Vaping Use: Never used  Substance and Sexual Activity   Alcohol use: Yes    Alcohol/week: 14.0 standard drinks    Types: 14 Glasses of wine per week    Comment: 2 glasses per night   Drug use: No   Sexual activity: Not Currently    Partners: Male  Other Topics Concern   Not on file  Social History Narrative   Exercise- Walks about 15-22 miles per week with womens group.   Social Determinants of Health   Financial Resource Strain: Low Risk    Difficulty of Paying Living Expenses: Not hard at all  Food Insecurity: No Food Insecurity   Worried About Charity fundraiser in the Last Year: Never true   [place] in the Last Year: Never true  Transportation Needs: No Transportation Needs   Lack of  Transportation (Medical): No   Lack of Transportation (Non-Medical): No  Physical Activity: Inactive   Days of Exercise per Week: 0 days   Minutes of Exercise per Session: 0 min  Stress: No Stress Concern Present   Feeling of Stress : Not at all  Social Connections: Moderately Integrated   Frequency of Communication with Friends and Family: More than three times a week   Frequency of Social Gatherings with  Friends and Family: More than three times a week   Attends Religious Services: Never   Marine scientist or Organizations: Yes   Attends Music therapist: More than 4 times per year   Marital Status: Married  Human resources officer Violence: Not At Risk   Fear of Current or Ex-Partner: No   Emotionally Abused: No   Physically Abused: No   Sexually Abused: No    Review of Systems  Per HPI.  Objective:   Vitals:   04/02/21 1307 04/02/21 1316  BP: (!) 174/90 (!) 166/84  Pulse: 81   Resp: 17   Temp: 98.3 F (36.8 C)   TempSrc: Temporal   SpO2: 99%   Weight: 140 lb 9.6 oz (63.8 kg)   Height: 5\' 5"  (1.651 m)      Physical Exam Vitals reviewed.  Constitutional:      General: She is not in acute distress.    Appearance: Normal appearance. She is well-developed.  HENT:     Head: Normocephalic and atraumatic.  Cardiovascular:     Rate and Rhythm: Normal rate.  Pulmonary:     Effort: Pulmonary effort is normal.     Breath sounds: Normal breath sounds.     Comments: Speaking full sentences, no respiratory distress.  Lungs clear bilaterally. Chest:    Musculoskeletal:     Comments: Thoracic, lumbar spine.  Multiple areas of slight discomfort midline, with some paraspinal tenderness right side mid spine.  Neurological:     Mental Status: She is alert and oriented to person, place, and time.  Psychiatric:        Mood and Affect: Mood normal.       Assessment & Plan:  Kristen Schultz is a 76 y.o. female . Fall in home, subsequent encounter  Mid  back pain  History of vertebral compression fracture - Plan: calcitonin, salmon, (MIACALCIN) 200 UNIT/ACT nasal spray, MR Thoracic Spine Wo Contrast, MR Lumbar Spine Wo Contrast  Rib pain on right side - Plan: DG Ribs Bilateral W/Chest  Rib pain on left side - Plan: DG Ribs Bilateral W/Chest  Persistent diffuse thoracic and upper lumbar pain.  For possible compression fractures noted on thoracic imaging, unknown timing.  Does report previous possible T12 compression fracture from prior injury, which may be the location of the reported L1 abnormality.  Continued pain as above with some difficulty tolerating tramadol.  -Check MRI T-spine, LS-spine to evaluate for bone marrow edema, timing of compression fractures/wedge deformities.  Depending on those results, likely next step neurosurgery evaluation  -Trial of calcitonin for pain relief, supplement NSAIDs if needed, start with Tylenol, option of repeat tramadol with stool softener or MiraLAX if needed.  -Repeat rib imaging given persistent but improved pain.  Possible nondisplaced fracture previously that may show up now.  Consider orthopedic follow-up  -Elevated blood pressure likely related to pain.  -Follow-up to be determined by imaging above, but will also forward note to her primary provider for ongoing care. Meds ordered this encounter  Medications   calcitonin, salmon, (MIACALCIN) 200 UNIT/ACT nasal spray    Sig: Place 1 spray into alternate nostrils daily.    Dispense:  3.7 mL    Refill:  12   Patient Instructions  Start calcitonin as that can help if pain from compression fractures.  Okay to use Tylenol, try to minimize use of NSAIDs.  Next step for the back would be to obtain an MRI, then likely neurosurgery follow-up to decide on treatment  options if recent compression fractures and continued pain.  Again the calcitonin may provide some relief.  I will order repeat rib series to see if there are any signs of fractures that were not  seen initially.  If you continue to have rib pain, it may be best to meet with orthopedist or specialist to decide on other treatment options if you are not able to tolerate tramadol.   I do recommend you follow-up with your primary care provider to continue treatment of these conditions so she can be aware and for follow-up notes from neurosurgery. I think it may be helpful to follow up with Dr. Etter Sjogren for ongoing care for these areas. Please call her office for appointment.   Ultram if needed, but take colace or other stool softener, or Miralax if needed for constipation.   Sorry you are still hurting.  We will try to get you some relief.  Thanks for coming in today.       Signed,   Merri Ray, MD [place] 04/02/21 6:27 PM

## 2021-04-02 NOTE — Patient Instructions (Addendum)
Start calcitonin as that can help if pain from compression fractures.  Okay to use Tylenol, try to minimize use of NSAIDs.  Next step for the back would be to obtain an MRI, then likely neurosurgery follow-up to decide on treatment options if recent compression fractures and continued pain.  Again the calcitonin may provide some relief.  I will order repeat rib series to see if there are any signs of fractures that were not seen initially.  If you continue to have rib pain, it may be best to meet with orthopedist or specialist to decide on other treatment options if you are not able to tolerate tramadol.   I do recommend you follow-up with your primary care provider to continue treatment of these conditions so she can be aware and for follow-up notes from neurosurgery. I think it may be helpful to follow up with Dr. Etter Sjogren for ongoing care for these areas. Please call her office for appointment.   Ultram if needed, but take colace or other stool softener, or Miralax if needed for constipation.   Sorry you are still hurting.  We will try to get you some relief.  Thanks for coming in today.

## 2021-04-09 ENCOUNTER — Ambulatory Visit
Admission: RE | Admit: 2021-04-09 | Discharge: 2021-04-09 | Disposition: A | Discharge status: Home / Self Care | Payer: Medicare HMO | Source: Ambulatory Visit | Attending: Family Medicine | Admitting: Family Medicine

## 2021-04-09 DIAGNOSIS — Z1231 Encounter for screening mammogram for malignant neoplasm of breast: Secondary | ICD-10-CM | POA: Diagnosis not present

## 2021-05-08 ENCOUNTER — Ambulatory Visit (HOSPITAL_COMMUNITY)
Admission: RE | Admit: 2021-05-08 | Discharge: 2021-05-08 | Disposition: A | Discharge status: Home / Self Care | Payer: Medicare HMO | Source: Ambulatory Visit | Attending: Family Medicine | Admitting: Family Medicine

## 2021-05-08 ENCOUNTER — Encounter: Payer: Self-pay | Admitting: Family Medicine

## 2021-05-08 ENCOUNTER — Other Ambulatory Visit: Payer: Self-pay

## 2021-05-08 DIAGNOSIS — M4316 Spondylolisthesis, lumbar region: Secondary | ICD-10-CM | POA: Diagnosis not present

## 2021-05-08 DIAGNOSIS — Z8781 Personal history of (healed) traumatic fracture: Secondary | ICD-10-CM | POA: Diagnosis not present

## 2021-05-08 DIAGNOSIS — M48061 Spinal stenosis, lumbar region without neurogenic claudication: Secondary | ICD-10-CM | POA: Diagnosis not present

## 2021-05-08 DIAGNOSIS — M40204 Unspecified kyphosis, thoracic region: Secondary | ICD-10-CM | POA: Diagnosis not present

## 2021-05-08 DIAGNOSIS — M5126 Other intervertebral disc displacement, lumbar region: Secondary | ICD-10-CM | POA: Diagnosis not present

## 2021-05-11 NOTE — Telephone Encounter (Signed)
Once you have the time patient is asking for their results

## 2021-05-12 ENCOUNTER — Telehealth: Payer: Self-pay | Admitting: Family Medicine

## 2021-05-12 ENCOUNTER — Encounter: Payer: Self-pay | Admitting: Family Medicine

## 2021-05-12 DIAGNOSIS — R0781 Pleurodynia: Secondary | ICD-10-CM

## 2021-05-12 DIAGNOSIS — Z8781 Personal history of (healed) traumatic fracture: Secondary | ICD-10-CM

## 2021-05-12 DIAGNOSIS — M545 Low back pain, unspecified: Secondary | ICD-10-CM | POA: Diagnosis not present

## 2021-05-12 NOTE — Telephone Encounter (Signed)
Reviewed results with patient, and after brief discussion with back specialist.  Fluid seen on thoracic spine MRI likely blood from previous injury, and some residual swelling/fluid.  Recommended against physical therapy at this time and continued time for healing.  After discussion with patient, will have her follow-up with Dr. Lynann Bologna to discuss further treatment and likely will need repeat MRI in 1 month..  All questions were answered.

## 2021-05-15 DIAGNOSIS — M545 Low back pain, unspecified: Secondary | ICD-10-CM | POA: Diagnosis not present

## 2021-05-21 DIAGNOSIS — M545 Low back pain, unspecified: Secondary | ICD-10-CM | POA: Diagnosis not present

## 2021-05-27 DIAGNOSIS — M545 Low back pain, unspecified: Secondary | ICD-10-CM | POA: Diagnosis not present

## 2021-06-02 DIAGNOSIS — M545 Low back pain, unspecified: Secondary | ICD-10-CM | POA: Diagnosis not present

## 2021-06-04 ENCOUNTER — Other Ambulatory Visit: Payer: Self-pay | Admitting: Family Medicine

## 2021-06-05 DIAGNOSIS — M546 Pain in thoracic spine: Secondary | ICD-10-CM | POA: Diagnosis not present

## 2021-06-09 ENCOUNTER — Encounter: Payer: Self-pay | Admitting: Family Medicine

## 2021-06-09 ENCOUNTER — Ambulatory Visit (INDEPENDENT_AMBULATORY_CARE_PROVIDER_SITE_OTHER): Payer: Medicare HMO | Admitting: Family Medicine

## 2021-06-09 VITALS — BP 130/88 | HR 80 | Temp 98.5°F | Resp 18 | Ht 65.0 in | Wt 140.2 lb

## 2021-06-09 DIAGNOSIS — U099 Post covid-19 condition, unspecified: Secondary | ICD-10-CM

## 2021-06-09 DIAGNOSIS — R1013 Epigastric pain: Secondary | ICD-10-CM | POA: Diagnosis not present

## 2021-06-09 DIAGNOSIS — L659 Nonscarring hair loss, unspecified: Secondary | ICD-10-CM

## 2021-06-09 DIAGNOSIS — R1011 Right upper quadrant pain: Secondary | ICD-10-CM | POA: Diagnosis not present

## 2021-06-09 NOTE — Progress Notes (Signed)
? ?Subjective:  ? ?By signing my name below, I, [name], attest that this documentation has been prepared under the direction and in the presence of Dr. Roma Schanz, DO. 06/09/2021 ? ? ? Patient ID: Kristen Schultz, female    DOB: [birth year], 76 y.o.   MRN: [id number] ? ?Chief Complaint  ?Patient presents with  ? Fall  ? Follow-up  ? ? ?Fall ? ?Patient is in today for a office visit.  ? ?She reports having a fall in December on a hard Dentist. She had an MRI and x-ray completed and found fractures around her ribs. She was given Calcitonin and pain medication to manage her symptoms. She continues having burning sensation in her ribs when she is tired. She describes the pain has her ribs pressing down on her soft tissue. She denies having any pain from GERD. She reports having a history of a duodenal ulcer over 20 years ago and does not remember if her pain is similar.  ?She also complains of feeling bloated. She thinks her bloating is happening due to feeling like her ribs are pressing down on her abdomen.  ?She also reports losing more hair since recovering from Covid-19. She typically losses more hair in the fall season but found she continues losing more hair. She is not taking biotin supplements at this time and is willing to start taking them regularly. She is interested in seeing a dermatologist to manage her symptoms as well.  ?She reports her older sister passed away from [place] at age 65 last year.  ? ? ?Past Medical History:  ?Diagnosis Date  ? Anxiety   ? Cellulitis of chin 11/17/2020  ? Colon polyps   ? COVID-19 11/17/2020  ? Gastric ulcer   ? per patient; in the 1970s  ? H. pylori infection   ? Heart murmur   ? Hyperlipidemia   ? no treatment per pt  ? Hypertension   ? Liver cyst   ? Ovarian cyst 1984  ? Pneumonia   ? ? ?Past Surgical History:  ?Procedure Laterality Date  ? COLONOSCOPY  02/10/2016  ? DENTAL SURGERY  2000  ? dental implant  ? DENTAL SURGERY  2021  ? 2  molars taken out   ? ESOPHAGOGASTRODUODENOSCOPY  11/06/2018  ? [facility]   ? ESOPHAGOGASTRODUODENOSCOPY  07/23/2019  ? [place]   ? EUS  07/23/2019  ? [place]   ? HEMORRHOID SURGERY    ? LAPAROSCOPIC LIVER CYST REMOVAL    ? OVARIAN CYST SURGERY    ? ? ?Family History  ?Problem Relation Age of Onset  ? Heart disease Father   ? Dementia Sister   ? Heart disease Brother 65  ? Heart disease Brother   ? Glaucoma Neg Hx   ? Colon cancer Neg Hx   ? Esophageal cancer Neg Hx   ? Pancreatic cancer Neg Hx   ? Stomach cancer Neg Hx   ? Rectal cancer Neg Hx   ? Colon polyps Neg Hx   ? ? ?Social History  ? ?Socioeconomic History  ? Marital status: Married  ?  Spouse name: [name]  ? Number of children: 1  ? Years of education: Not on file  ? Highest education level: Not on file  ?Occupational History  ? Occupation: retired  ?  Comment: retired  ?Tobacco Use  ? Smoking status: Former  ?  Packs/day: 0.30  ?  Years: 15.00  ?  Pack years: 4.50  ?  Types: Cigarettes  ?  Quit date: 11/17/1985  ?  Years since quitting: 35.5  ? Smokeless tobacco: Never  ?Vaping Use  ? Vaping Use: Never used  ?Substance and Sexual Activity  ? Alcohol use: Yes  ?  Alcohol/week: 14.0 standard drinks  ?  Types: 14 Glasses of wine per week  ?  Comment: 2 glasses per night  ? Drug use: No  ? Sexual activity: Not Currently  ?  Partners: Male  ?Other Topics Concern  ? Not on file  ?Social History Narrative  ? Exercise- Walks about 15-22 miles per week with womens group.  ? ?Social Determinants of Health  ? ?Financial Resource Strain: Low Risk   ? Difficulty of Paying Living Expenses: Not hard at all  ?Food Insecurity: No Food Insecurity  ? Worried About Charity fundraiser in the Last Year: Never true  ? Ran Out of Food in the Last Year: Never true  ?Transportation Needs: No Transportation Needs  ? Lack of Transportation (Medical): No  ? Lack of Transportation (Non-Medical): No  ?Physical Activity: Inactive  ? Days of Exercise per  Week: 0 days  ? Minutes of Exercise per Session: 0 min  ?Stress: No Stress Concern Present  ? Feeling of Stress : Not at all  ?Social Connections: Moderately Integrated  ? Frequency of Communication with Friends and Family: More than three times a week  ? Frequency of Social Gatherings with Friends and Family: More than three times a week  ? Attends Religious Services: Never  ? Active Member of Clubs or Organizations: Yes  ? Attends Archivist Meetings: More than 4 times per year  ? Marital Status: Married  ?Intimate Partner Violence: Not At Risk  ? Fear of Current or Ex-Partner: No  ? Emotionally Abused: No  ? Physically Abused: No  ? Sexually Abused: No  ? ? ?Outpatient Medications Prior to Visit  ?Medication Sig Dispense Refill  ? calcitonin, salmon, (MIACALCIN) 200 UNIT/ACT nasal spray Place 1 spray into alternate nostrils daily. 3.7 mL 12  ? Cholecalciferol (VITAMIN D-3) 125 MCG (5000 UT) TABS Take 1 tablet by mouth daily.    ? citalopram (CELEXA) 10 MG tablet TAKE 1 TABLET BY MOUTH EVERY DAY 90 tablet 1  ? cyanocobalamin 100 MCG tablet Take by mouth.    ? losartan (COZAAR) 50 MG tablet TAKE 1 TABLET BY MOUTH EVERY DAY 90 tablet 1  ? Multiple Vitamin (MULTIVITAMIN) capsule Take 1 capsule by mouth daily.    ? famotidine (PEPCID) 20 MG tablet TAKE 1 TABLET BY MOUTH TWICE A DAY (Patient not taking: Reported on 03/05/2021) 180 tablet 1  ? mupirocin ointment (BACTROBAN) 2 % Apply 1 application topically 2 (two) times daily. (Patient not taking: Reported on 06/09/2021) 22 g 0  ? traMADol (ULTRAM) 50 MG tablet Take 1 tablet (50 mg total) by mouth every 6 (six) hours as needed. 20 tablet 0  ? ?No facility-administered medications prior to visit.  ? ? ?Allergies  ?Allergen Reactions  ? Bee Venom Anaphylaxis  ?  Bee stings  ? Codeine   ?  Extreme Vomiting  ? Other Other (See Comments)  ?  CDN - reaction unknown  ? Penicillins   ?  Redness, swelling and itching ?Can take Keflex  ? Tetracyclines & Related   ?   Yeast Infection and Stomach problems  ? Xylocaine Dental [Lidocaine-Epinephrine]   ? ? ?Review of Systems  ?Musculoskeletal:   ?     (+)pain in middle  ribs (R>L)  ?Skin:   ?     (+)hairloss  ? ?   ?Objective:  ?  ?Physical Exam ?Constitutional:   ?   General: She is not in acute distress. ?   Appearance: Normal appearance. She is not ill-appearing.  ?HENT:  ?   Head: Normocephalic and atraumatic.  ?   Right Ear: External ear normal.  ?   Left Ear: External ear normal.  ?Eyes:  ?   Extraocular Movements: Extraocular movements intact.  ?   Pupils: Pupils are equal, round, and reactive to light.  ?Cardiovascular:  ?   Rate and Rhythm: Normal rate and regular rhythm.  ?   Heart sounds: Normal heart sounds. No murmur heard. ?  No gallop.  ?Pulmonary:  ?   Effort: Pulmonary effort is normal. No respiratory distress.  ?   Breath sounds: Normal breath sounds. No wheezing or rales.  ?Abdominal:  ?   Palpations: Abdomen is soft.  ?   Tenderness: There is abdominal tenderness in the right upper quadrant and epigastric area.  ?Skin: ?   General: Skin is warm and dry.  ?Neurological:  ?   Mental Status: She is alert and oriented to person, place, and time.  ?Psychiatric:     ?   Judgment: Judgment normal.  ? ? ?BP 130/88 (BP Location: Left Arm, Patient Position: Sitting, Cuff Size: Normal)   Pulse 80   Temp 98.5 ?F (36.9 ?C) (Oral)   Resp 18   Ht '5\' 5"'$  (1.651 m)   Wt 140 lb 3.2 oz (63.6 kg)   SpO2 97%   BMI 23.33 kg/m?  ?Wt Readings from Last 3 Encounters:  ?06/09/21 140 lb 3.2 oz (63.6 kg)  ?04/02/21 140 lb 9.6 oz (63.8 kg)  ?03/05/21 142 lb 9.6 oz (64.7 kg)  ? ? ?Diabetic Foot Exam - Simple   ?No data filed ?  ? ?Lab Results  ?Component Value Date  ? WBC 6.7 06/09/2021  ? HGB 14.4 06/09/2021  ? HCT 43.1 06/09/2021  ? PLT 268.0 06/09/2021  ? GLUCOSE 61 (L) 06/09/2021  ? CHOL 197 12/15/2020  ? TRIG 54.0 12/15/2020  ? HDL 84.20 12/15/2020  ? LDLCALC 102 (H) 12/15/2020  ? ALT 15 06/09/2021  ? AST 24 06/09/2021  ? NA 137  06/09/2021  ? K 5.0 06/09/2021  ? CL 101 06/09/2021  ? CREATININE 0.91 06/09/2021  ? BUN 23 06/09/2021  ? CO2 30 06/09/2021  ? TSH 2.36 06/09/2021  ? HGBA1C 5.4 04/10/2020  ? MICROALBUR <0.7 05/01/2018  ? ? ?Lab Results

## 2021-06-09 NOTE — Patient Instructions (Signed)

## 2021-06-10 ENCOUNTER — Ambulatory Visit (HOSPITAL_BASED_OUTPATIENT_CLINIC_OR_DEPARTMENT_OTHER)
Admission: RE | Admit: 2021-06-10 | Discharge: 2021-06-10 | Disposition: A | Discharge status: Home / Self Care | Payer: Medicare HMO | Source: Ambulatory Visit | Attending: Family Medicine | Admitting: Family Medicine

## 2021-06-10 ENCOUNTER — Other Ambulatory Visit: Payer: Self-pay

## 2021-06-10 DIAGNOSIS — R1011 Right upper quadrant pain: Secondary | ICD-10-CM | POA: Diagnosis not present

## 2021-06-10 DIAGNOSIS — R1013 Epigastric pain: Secondary | ICD-10-CM | POA: Diagnosis not present

## 2021-06-10 DIAGNOSIS — N281 Cyst of kidney, acquired: Secondary | ICD-10-CM | POA: Diagnosis not present

## 2021-06-10 DIAGNOSIS — I700 Atherosclerosis of aorta: Secondary | ICD-10-CM | POA: Diagnosis not present

## 2021-06-10 LAB — COMPREHENSIVE METABOLIC PANEL
ALT: 15 U/L (ref 0–35)
AST: 24 U/L (ref 0–37)
Albumin: 4 g/dL (ref 3.5–5.2)
Alkaline Phosphatase: 58 U/L (ref 39–117)
BUN: 23 mg/dL (ref 6–23)
CO2: 30 mEq/L (ref 19–32)
Calcium: 9.4 mg/dL (ref 8.4–10.5)
Chloride: 101 mEq/L (ref 96–112)
Creatinine, Ser: 0.91 mg/dL (ref 0.40–1.20)
GFR: 61.82 mL/min (ref >60.00)
Glucose, Bld: 61 mg/dL — ABNORMAL LOW (ref 70–99)
Potassium: 5 mEq/L (ref 3.5–5.1)
Sodium: 137 mEq/L (ref 135–145)
Total Bilirubin: 0.4 mg/dL (ref 0.2–1.2)
Total Protein: 6.9 g/dL (ref 6.0–8.3)

## 2021-06-10 LAB — H. PYLORI ANTIBODY, IGG: H Pylori IgG: NEGATIVE

## 2021-06-10 LAB — TSH: TSH: 2.36 u[IU]/mL (ref 0.35–5.50)

## 2021-06-10 LAB — CBC WITH DIFFERENTIAL/PLATELET
Basophils Absolute: 0.1 10*3/uL (ref 0.0–0.1)
Basophils Relative: 1.5 % (ref 0.0–3.0)
Eosinophils Absolute: 0.1 10*3/uL (ref 0.0–0.7)
Eosinophils Relative: 1.1 % (ref 0.0–5.0)
HCT: 43.1 % (ref 36.0–46.0)
Hemoglobin: 14.4 g/dL (ref 12.0–15.0)
Lymphocytes Relative: 22.7 % (ref 12.0–46.0)
Lymphs Abs: 1.5 10*3/uL (ref 0.7–4.0)
MCHC: 33.4 g/dL (ref 30.0–36.0)
MCV: 96.9 fl (ref 78.0–100.0)
Monocytes Absolute: 0.8 10*3/uL (ref 0.1–1.0)
Monocytes Relative: 12.3 % — ABNORMAL HIGH (ref 3.0–12.0)
Neutro Abs: 4.2 10*3/uL (ref 1.4–7.7)
Neutrophils Relative %: 62.4 % (ref 43.0–77.0)
Platelets: 268 10*3/uL (ref 150.0–400.0)
RBC: 4.45 Mil/uL (ref 3.87–5.11)
RDW: 12.9 % (ref 11.5–15.5)
WBC: 6.7 10*3/uL (ref 4.0–10.5)

## 2021-06-10 LAB — AMYLASE: Amylase: 362 U/L — ABNORMAL HIGH (ref 27–131)

## 2021-06-10 LAB — VITAMIN B12: Vitamin B-12: 220 pg/mL (ref 211–911)

## 2021-06-11 DIAGNOSIS — M545 Low back pain, unspecified: Secondary | ICD-10-CM | POA: Diagnosis not present

## 2021-06-11 DIAGNOSIS — R1011 Right upper quadrant pain: Secondary | ICD-10-CM | POA: Insufficient documentation

## 2021-06-11 NOTE — Assessment & Plan Note (Signed)
[place] abd  ?May need referral back to GI ?

## 2021-06-18 ENCOUNTER — Encounter: Payer: Self-pay | Admitting: Family Medicine

## 2021-06-18 ENCOUNTER — Ambulatory Visit (INDEPENDENT_AMBULATORY_CARE_PROVIDER_SITE_OTHER): Payer: Medicare HMO | Admitting: Family Medicine

## 2021-06-18 ENCOUNTER — Other Ambulatory Visit: Payer: Self-pay | Admitting: Family Medicine

## 2021-06-18 VITALS — BP 124/82 | HR 90 | Temp 97.7°F | Resp 16 | Ht 65.0 in | Wt 140.6 lb

## 2021-06-18 DIAGNOSIS — Z0000 Encounter for general adult medical examination without abnormal findings: Secondary | ICD-10-CM

## 2021-06-18 DIAGNOSIS — E785 Hyperlipidemia, unspecified: Secondary | ICD-10-CM

## 2021-06-18 DIAGNOSIS — M545 Low back pain, unspecified: Secondary | ICD-10-CM | POA: Diagnosis not present

## 2021-06-18 DIAGNOSIS — F4323 Adjustment disorder with mixed anxiety and depressed mood: Secondary | ICD-10-CM

## 2021-06-18 DIAGNOSIS — R1011 Right upper quadrant pain: Secondary | ICD-10-CM | POA: Diagnosis not present

## 2021-06-18 DIAGNOSIS — F411 Generalized anxiety disorder: Secondary | ICD-10-CM

## 2021-06-18 DIAGNOSIS — I10 Essential (primary) hypertension: Secondary | ICD-10-CM

## 2021-06-18 DIAGNOSIS — R69 Illness, unspecified: Secondary | ICD-10-CM | POA: Diagnosis not present

## 2021-06-18 LAB — LIPID PANEL
Cholesterol: 207 mg/dL — ABNORMAL HIGH (ref 0–200)
HDL: 91.7 mg/dL (ref >39.00)
LDL Cholesterol: 103 mg/dL — ABNORMAL HIGH (ref 0–99)
NonHDL: 114.84
Total CHOL/HDL Ratio: 2
Triglycerides: 60 mg/dL (ref 0.0–149.0)
VLDL: 12 mg/dL (ref 0.0–40.0)

## 2021-06-18 MED ORDER — LOSARTAN POTASSIUM 50 MG PO TABS: 50.0000 mg | ORAL_TABLET | Freq: Every day | ORAL | 1 refills | Status: DC

## 2021-06-18 MED ORDER — CITALOPRAM HYDROBROMIDE 10 MG PO TABS: 10.0000 mg | ORAL_TABLET | Freq: Every day | ORAL | 3 refills | Status: DC

## 2021-06-18 NOTE — Assessment & Plan Note (Signed)
Encourage heart healthy diet such as MIND or DASH diet, increase exercise, avoid trans fats, simple carbohydrates and processed foods, consider a krill or fish or flaxseed oil cap daily.  °

## 2021-06-18 NOTE — Assessment & Plan Note (Signed)
Well controlled, no changes to meds. Encouraged heart healthy diet such as the DASH diet and exercise as tolerated.  °

## 2021-06-18 NOTE — Assessment & Plan Note (Signed)
ghm utd Check labs  

## 2021-06-18 NOTE — Patient Instructions (Signed)
Preventive Care 34 Years and Older, Female ?Preventive care refers to lifestyle choices and visits with your health care provider that can promote health and wellness. Preventive care visits are also called wellness exams. ?What can I expect for my preventive care visit? ?Counseling ?Your health care provider may ask you questions about your: ?Medical history, including: ?Past medical problems. ?Family medical history. ?Pregnancy and menstrual history. ?History of falls. ?Current health, including: ?Memory and ability to understand (cognition). ?Emotional well-being. ?Home life and relationship well-being. ?Sexual activity and sexual health. ?Lifestyle, including: ?Alcohol, nicotine or tobacco, and drug use. ?Access to firearms. ?Diet, exercise, and sleep habits. ?Work and work Statistician. ?Sunscreen use. ?Safety issues such as seatbelt and bike helmet use. ?Physical exam ?Your health care provider will check your: ?Height and weight. These may be used to calculate your BMI (body mass index). BMI is a measurement that tells if you are at a healthy weight. ?Waist circumference. This measures the distance around your waistline. This measurement also tells if you are at a healthy weight and may help predict your risk of certain diseases, such as type 2 diabetes and high blood pressure. ?Heart rate and blood pressure. ?Body temperature. ?Skin for abnormal spots. ?What immunizations do I need? ?Vaccines are usually given at various ages, according to a schedule. Your health care provider will recommend vaccines for you based on your age, medical history, and lifestyle or other factors, such as travel or where you work. ?What tests do I need? ?Screening ?Your health care provider may recommend screening tests for certain conditions. This may include: ?Lipid and cholesterol levels. ?Hepatitis C test. ?Hepatitis B test. ?HIV (human immunodeficiency virus) test. ?STI (sexually transmitted infection) testing, if you are at  risk. ?Lung cancer screening. ?Colorectal cancer screening. ?Diabetes screening. This is done by checking your blood sugar (glucose) after you have not eaten for a while (fasting). ?Mammogram. Talk with your health care provider about how often you should have regular mammograms. ?BRCA-related cancer screening. This may be done if you have a family history of breast, ovarian, tubal, or peritoneal cancers. ?Bone density scan. This is done to screen for osteoporosis. ?Talk with your health care provider about your test results, treatment options, and if necessary, the need for more tests. ?Follow these instructions at home: ?Eating and drinking ? ?Eat a diet that includes fresh fruits and vegetables, whole grains, lean protein, and low-fat dairy products. Limit your intake of foods with high amounts of sugar, saturated fats, and salt. ?Take vitamin and mineral supplements as recommended by your health care provider. ?Do not drink alcohol if your health care provider tells you not to drink. ?If you drink alcohol: ?Limit how much you have to 0-1 drink a day. ?Know how much alcohol is in your drink. In the [place] one drink equals one 12 oz bottle of beer (355 mL), one 5 oz glass of wine (148 mL), or one 1? oz glass of hard liquor (44 mL). ?Lifestyle ?Brush your teeth every morning and night with fluoride toothpaste. Floss one time each day. ?Exercise for at least 30 minutes 5 or more days each week. ?Do not use any products that contain nicotine or tobacco. These products include cigarettes, chewing tobacco, and vaping devices, such as e-cigarettes. If you need help quitting, ask your health care provider. ?Do not use drugs. ?If you are sexually active, practice safe sex. Use a condom or other form of protection in order to prevent STIs. ?Take aspirin only as told by your  health care provider. Make sure that you understand how much to take and what form to take. Work with your health care provider to find out whether it  is safe and beneficial for you to take aspirin daily. ?Ask your health care provider if you need to take a cholesterol-lowering medicine (statin). ?Find healthy ways to manage stress, such as: ?Meditation, yoga, or listening to music. ?Journaling. ?Talking to a trusted person. ?Spending time with friends and family. ?Minimize exposure to UV radiation to reduce your risk of skin cancer. ?Safety ?Always wear your seat belt while driving or riding in a vehicle. ?Do not drive: ?If you have been drinking alcohol. Do not ride with someone who has been drinking. ?When you are tired or distracted. ?While texting. ?If you have been using any mind-altering substances or drugs. ?Wear a helmet and other protective equipment during sports activities. ?If you have firearms in your house, make sure you follow all gun safety procedures. ?What's next? ?Visit your health care provider once a year for an annual wellness visit. ?Ask your health care provider how often you should have your eyes and teeth checked. ?Stay up to date on all vaccines. ?This information is not intended to replace advice given to you by your health care provider. Make sure you discuss any questions you have with your health care provider. ?Document Revised: 09/03/2020 Document Reviewed: 09/03/2020 ?Elsevier Patient Education ? [place]. ? ?

## 2021-06-18 NOTE — Assessment & Plan Note (Signed)
stable °

## 2021-06-18 NOTE — Assessment & Plan Note (Signed)
gb normal  ?? hida scan ?Consider f/u with GI ?Pt wants to hold off ?

## 2021-06-18 NOTE — Progress Notes (Signed)
? ?Subjective:  ? ?By signing my name below, I, Kristen Schultz, attest that this documentation has been prepared under the direction and in the presence of [name], DO. 06/18/2021 ?   ? ? Patient ID: Kristen Schultz, female    DOB: 05/15/1945, 76 y.o.   MRN: [id number] ? ?No chief complaint on file. ? ? ?HPI ?Patient is in today for a comprehensive physical exam.  ? ?She is requesting a refill on 10 mg Celexa, 50 mg losartan.  ?She continues having RUQ abdominal pain. During her last blood work her amylase was elevated. Her US showed no new issues. She finds eating certain foods can worsen her pain. She does not know which specific foods irritate her symptoms. She stopped famotidine at least 6 months ago due to her symptoms of GERD improving and thinking the side effects would affect her bone health. She is thinking of taking it again to see if it improves her digestive issues.  ? ?Her blood pressure doing well during this visit. She continues taking 50 mg Losartan daily PO and reports no new issues while taking it.  ?BP Readings from Last 3 Encounters:  ?06/18/21 124/82  ?06/09/21 130/88  ?04/02/21 (!) 166/84  ? ?Pulse Readings from Last 3 Encounters:  ?06/18/21 90  ?06/09/21 80  ?04/02/21 81  ? ?She continues taking 10 mg Celexa daily PO and reports no new issues while taking it.  ?She continues seeing a PT to manage her injury from her fall. She is planning on stopping treatment due to not have enough time to complete it.  ?She is due for a colonoscopy and does not have an appointment scheduled.  ? ?She denies having any fever, new muscle pain, new joint pain, new moles, congestion, sinus pain, sore throat, chest pain, palpations, cough, SOB, wheezing, n/v/d, constipation, blood in stool, dysuria, frequency, hematuria, or headaches at this time. ?She has right hip pain going to manage it on her own until her next bone density scan next year. Otherwise she has no other joint pain.  ?Her sister has  passed away from dementia, otherwise she has no changes to her family medical history.  ?She is UTD on tetanus vaccine. She is UTD on flu vaccine this year. She is UTD on Covid-19 vaccines.  ?She recently started walking regularly for exercise.  ?She is UTD on vision care. ?She is UTD on dental care.  ? ? ?Past Medical History:  ?Diagnosis Date  ? Anxiety   ? Cellulitis of chin 11/17/2020  ? Colon polyps   ? COVID-19 11/17/2020  ? Gastric ulcer   ? per patient; in the 1970s  ? H. pylori infection   ? Heart murmur   ? Hyperlipidemia   ? no treatment per pt  ? Hypertension   ? Liver cyst   ? Ovarian cyst 1984  ? Pneumonia   ? ? ?Past Surgical History:  ?Procedure Laterality Date  ? COLONOSCOPY  02/10/2016  ? DENTAL SURGERY  2000  ? dental implant  ? DENTAL SURGERY  2021  ? 2 molars taken out   ? ESOPHAGOGASTRODUODENOSCOPY  11/06/2018  ? [facility]   ? ESOPHAGOGASTRODUODENOSCOPY  07/23/2019  ? [place]   ? EUS  07/23/2019  ? [place]   ? HEMORRHOID SURGERY    ? LAPAROSCOPIC LIVER CYST REMOVAL    ? OVARIAN CYST SURGERY    ? ? ?Family History  ?Problem Relation Age of Onset  ? Heart  disease Father   ? Dementia Sister   ? Heart disease Brother 45  ? Heart disease Brother   ? Glaucoma Neg Hx   ? Colon cancer Neg Hx   ? Esophageal cancer Neg Hx   ? Pancreatic cancer Neg Hx   ? Stomach cancer Neg Hx   ? Rectal cancer Neg Hx   ? Colon polyps Neg Hx   ? ? ?Social History  ? ?Socioeconomic History  ? Marital status: Married  ?  Spouse name: [name]  ? Number of children: 1  ? Years of education: Not on file  ? Highest education level: Not on file  ?Occupational History  ? Occupation: retired  ?  Comment: retired  ?Tobacco Use  ? Smoking status: Former  ?  Packs/day: 0.30  ?  Years: 15.00  ?  Pack years: 4.50  ?  Types: Cigarettes  ?  Quit date: 11/17/1985  ?  Years since quitting: 35.6  ? Smokeless tobacco: Never  ?Vaping Use  ? Vaping Use: Never used  ?Substance and Sexual Activity  ? Alcohol use: Yes  ?   Alcohol/week: 14.0 standard drinks  ?  Types: 14 Glasses of wine per week  ?  Comment: 2 glasses per night  ? Drug use: No  ? Sexual activity: Not Currently  ?  Partners: Male  ?Other Topics Concern  ? Not on file  ?Social History Narrative  ? Exercise- Walks about 15-22 miles per week with womens group.  ? ?Social Determinants of Health  ? ?Financial Resource Strain: Low Risk   ? Difficulty of Paying Living Expenses: Not hard at all  ?Food Insecurity: No Food Insecurity  ? Worried About Charity fundraiser in the Last Year: Never true  ? Ran Out of Food in the Last Year: Never true  ?Transportation Needs: No Transportation Needs  ? Lack of Transportation (Medical): No  ? Lack of Transportation (Non-Medical): No  ?Physical Activity: Inactive  ? Days of Exercise per Week: 0 days  ? Minutes of Exercise per Session: 0 min  ?Stress: No Stress Concern Present  ? Feeling of Stress : Not at all  ?Social Connections: Moderately Integrated  ? Frequency of Communication with Friends and Family: More than three times a week  ? Frequency of Social Gatherings with Friends and Family: More than three times a week  ? Attends Religious Services: Never  ? Active Member of Clubs or Organizations: Yes  ? Attends Archivist Meetings: More than 4 times per year  ? Marital Status: Married  ?Intimate Partner Violence: Not At Risk  ? Fear of Current or Ex-Partner: No  ? Emotionally Abused: No  ? Physically Abused: No  ? Sexually Abused: No  ? ? ?Outpatient Medications Prior to Visit  ?Medication Sig Dispense Refill  ? calcitonin, salmon, (MIACALCIN) 200 UNIT/ACT nasal spray Place 1 spray into alternate nostrils daily. 3.7 mL 12  ? Cholecalciferol (VITAMIN D-3) 125 MCG (5000 UT) TABS Take 1 tablet by mouth daily.    ? citalopram (CELEXA) 10 MG tablet TAKE 1 TABLET BY MOUTH EVERY DAY 90 tablet 1  ? cyanocobalamin 100 MCG tablet Take by mouth.    ? losartan (COZAAR) 50 MG tablet TAKE 1 TABLET BY MOUTH EVERY DAY 90 tablet 1  ?  Multiple Vitamin (MULTIVITAMIN) capsule Take 1 capsule by mouth daily.    ? ?No facility-administered medications prior to visit.  ? ? ?Allergies  ?Allergen Reactions  ? Bee Venom Anaphylaxis  ?  Bee stings  ? Codeine   ?  Extreme Vomiting  ? Other Other (See Comments)  ?  CDN - reaction unknown  ? Penicillins   ?  Redness, swelling and itching ?Can take Keflex  ? Tetracyclines & Related   ?  Yeast Infection and Stomach problems  ? Xylocaine Dental [Lidocaine-Epinephrine]   ? ? ?Review of Systems  ?Constitutional:  Negative for fever.  ?HENT:  Negative for congestion, sinus pain and sore throat.   ?Respiratory:  Negative for cough, shortness of breath and wheezing.   ?Cardiovascular:  Negative for chest pain and palpitations.  ?Gastrointestinal:  Negative for blood in stool, constipation, diarrhea, nausea and vomiting.  ?Genitourinary:  Negative for dysuria, frequency and hematuria.  ?Musculoskeletal:  Negative for joint pain and myalgias.  ?     (+)right hip pain   ?Skin:   ?     (-)New moles  ?Neurological:  Negative for headaches.  ? ?   ?Objective:  ?  ?Physical Exam ?Constitutional:   ?   General: She is not in acute distress. ?   Appearance: Normal appearance. She is not ill-appearing.  ?HENT:  ?   Head: Normocephalic and atraumatic.  ?   Right Ear: Tympanic membrane, ear canal and external ear normal.  ?   Left Ear: Tympanic membrane, ear canal and external ear normal.  ?Eyes:  ?   Extraocular Movements: Extraocular movements intact.  ?   Pupils: Pupils are equal, round, and reactive to light.  ?Cardiovascular:  ?   Rate and Rhythm: Normal rate and regular rhythm.  ?   Heart sounds: Normal heart sounds. No murmur heard. ?  No gallop.  ?Pulmonary:  ?   Effort: Pulmonary effort is normal. No respiratory distress.  ?   Breath sounds: Normal breath sounds. No wheezing or rales.  ?Abdominal:  ?   General: Bowel sounds are normal. There is no distension.  ?   Palpations: Abdomen is soft.  ?   Tenderness: There is  no abdominal tenderness. There is no guarding.  ?Skin: ?   General: Skin is warm and dry.  ?Neurological:  ?   Mental Status: She is alert and oriented to person, place, and time.  ?Psychiatric:     ?   Dolphus Jenny

## 2021-06-20 ENCOUNTER — Encounter: Payer: Self-pay | Admitting: Family Medicine

## 2021-06-23 ENCOUNTER — Encounter: Payer: Self-pay | Admitting: Gastroenterology

## 2021-07-16 DIAGNOSIS — L281 Prurigo nodularis: Secondary | ICD-10-CM | POA: Diagnosis not present

## 2021-07-16 DIAGNOSIS — L821 Other seborrheic keratosis: Secondary | ICD-10-CM | POA: Diagnosis not present

## 2021-07-16 DIAGNOSIS — L650 Telogen effluvium: Secondary | ICD-10-CM | POA: Diagnosis not present

## 2021-07-16 DIAGNOSIS — D2371 Other benign neoplasm of skin of right lower limb, including hip: Secondary | ICD-10-CM | POA: Diagnosis not present

## 2021-07-16 DIAGNOSIS — L814 Other melanin hyperpigmentation: Secondary | ICD-10-CM | POA: Diagnosis not present

## 2021-07-17 DIAGNOSIS — M5451 Vertebrogenic low back pain: Secondary | ICD-10-CM | POA: Diagnosis not present

## 2021-07-17 DIAGNOSIS — M546 Pain in thoracic spine: Secondary | ICD-10-CM | POA: Diagnosis not present

## 2021-10-01 DIAGNOSIS — H00012 Hordeolum externum right lower eyelid: Secondary | ICD-10-CM | POA: Diagnosis not present

## 2021-12-14 ENCOUNTER — Other Ambulatory Visit: Payer: Self-pay | Admitting: Family Medicine

## 2021-12-14 DIAGNOSIS — I10 Essential (primary) hypertension: Secondary | ICD-10-CM

## 2022-01-18 ENCOUNTER — Other Ambulatory Visit: Payer: Self-pay | Admitting: Family Medicine

## 2022-01-18 ENCOUNTER — Ambulatory Visit (INDEPENDENT_AMBULATORY_CARE_PROVIDER_SITE_OTHER): Payer: Medicare HMO | Admitting: Family Medicine

## 2022-01-18 ENCOUNTER — Telehealth: Payer: Self-pay | Admitting: Family Medicine

## 2022-01-18 ENCOUNTER — Encounter: Payer: Self-pay | Admitting: Family Medicine

## 2022-01-18 VITALS — BP 118/70 | HR 100 | Temp 98.1°F | Resp 18 | Ht 65.0 in | Wt 142.0 lb

## 2022-01-18 DIAGNOSIS — L709 Acne, unspecified: Secondary | ICD-10-CM

## 2022-01-18 DIAGNOSIS — H2513 Age-related nuclear cataract, bilateral: Secondary | ICD-10-CM | POA: Diagnosis not present

## 2022-01-18 MED ORDER — TRETINOIN MICROSPHERE 0.04 % EX GEL: Freq: Every day | CUTANEOUS | 3 refills | Status: DC

## 2022-01-18 MED ORDER — CLINDAMYCIN PHOSPHATE 1 % EX SOLN: Freq: Two times a day (BID) | CUTANEOUS | 3 refills | Status: DC

## 2022-01-18 MED ORDER — TRETINOIN 0.025 % EX CREA: TOPICAL_CREAM | Freq: Every day | CUTANEOUS | 0 refills | Status: DC

## 2022-01-18 NOTE — Assessment & Plan Note (Signed)
Cleocin bid Retin a Can still use benzoyl peroxide F/u derm

## 2022-01-18 NOTE — Progress Notes (Signed)
Subjective:   By signing my name below, I, Carylon Perches, attest that this documentation has been prepared under the direction and in the presence of [name] DO 01/18/2022   Patient ID: Kristen Schultz, female    DOB: 1945/06/19, 76 y.o.   MRN: [id number]  Chief Complaint  Patient presents with   Skin Problem    Pt states having dry skin and adult acne. Pt states sxs started over the summer.    HPI Patient is in today for an office visit  She complains of skin symptoms with associated symptoms of hair loss. She states that around August of last year, she developed Covid. This resulted in her developing some hair loss and hard spots along her cheek that mirror similar appearances as cellulitis. Since then, these spots have been persistent but her hair loss is improving. She has used Benzoyl Peroxide which dried out her skin. She notes that she lost some collagen and has in total lost about 8 lbs. She had an appointment on 07/16/2021 with Dr.Jones and was given a steroid cream for breakouts on her buttock which improved those symptoms. She also used the cream on her face. She confirms that she is negative for HIV at the time. She also has been sterilizing her tweezers with rubbing alcohol to prevent worsening of symptoms. She notes that during the Summer, she developed a style in her eye and used Keflex for her symptoms. She has been off the medication for about 6-8 weeks now. She also notes that in December, she had a fall and therefore was prescribed 200 Unit/ACT of Calcitonin. She soon developed a  nosebleed and a bump on her nose. She has since stopped taking the medication as of three months ago.  She reports that she was undergoing physical therapy after her fall in December. She states that therapy went well. She was informed by Dr.Dumonski that symptoms are improving.   Past Medical History:  Diagnosis Date   Anxiety    Cellulitis of chin 11/17/2020   Colon polyps     COVID-19 11/17/2020   Gastric ulcer    per patient; in the 1970s   H. pylori infection    Heart murmur    Hyperlipidemia    no treatment per pt   Hypertension    Liver cyst    Ovarian cyst 1984   Pneumonia     Past Surgical History:  Procedure Laterality Date   COLONOSCOPY  02/10/2016   DENTAL SURGERY  2000   dental implant   DENTAL SURGERY  2021   2 molars taken out    ESOPHAGOGASTRODUODENOSCOPY  11/06/2018   [facility]    ESOPHAGOGASTRODUODENOSCOPY  07/23/2019   [place]    EUS  07/23/2019   [place] [facility]     LAPAROSCOPIC LIVER CYST REMOVAL     OVARIAN CYST SURGERY      Family History  Problem Relation Age of Onset   Heart disease Father    Heart failure Sister    Heart disease Sister    Dementia Sister    Heart disease Brother 4   Heart disease Brother    Glaucoma Neg Hx    Colon cancer Neg Hx    Esophageal cancer Neg Hx    Pancreatic cancer Neg Hx    Stomach cancer Neg Hx    Rectal cancer Neg Hx    Colon polyps Neg Hx  Social History   Socioeconomic History   Marital status: Married    Spouse name: Tom   Number of children: 1   Years of education: Not on file   Highest education level: Not on file  Occupational History   Occupation: retired    Comment: retired  Tobacco Use   Smoking status: Former    Packs/day: 0.30    Years: 15.00    Total pack years: 4.50    Types: Cigarettes    Quit date: 11/17/1985    Years since quitting: 36.1   Smokeless tobacco: Never  Vaping Use   Vaping Use: Never used  Substance and Sexual Activity   Alcohol use: Yes    Alcohol/week: 14.0 standard drinks of alcohol    Types: 14 Glasses of wine per week    Comment: 2 glasses per night   Drug use: No   Sexual activity: Not Currently    Partners: Male  Other Topics Concern   Not on file  Social History Narrative   Exercise- Walks about 15-22 miles per week with womens group.   Social Determinants of Health    Financial Resource Strain: Low Risk  (01/13/2021)   Overall Financial Resource Strain (CARDIA)    Difficulty of Paying Living Expenses: Not hard at all  Food Insecurity: No Food Insecurity (01/13/2021)   Hunger Vital Sign    Worried About Running Out of Food in the Last Year: Never true    Ran Out of Food in the Last Year: Never true  Transportation Needs: No Transportation Needs (01/13/2021)   PRAPARE - Hydrologist (Medical): No    Lack of Transportation (Non-Medical): No  Physical Activity: Inactive (01/13/2021)   Exercise Vital Sign    Days of Exercise per Week: 0 days    Minutes of Exercise per Session: 0 min  Stress: No Stress Concern Present (01/13/2021)   [place]    Feeling of Stress : Not at all  Social Connections: Moderately Integrated (01/13/2021)   Social Connection and Isolation Panel [NHANES]    Frequency of Communication with Friends and Family: More than three times a week    Frequency of Social Gatherings with Friends and Family: More than three times a week    Attends Religious Services: Never    Marine scientist or Organizations: Yes    Attends Music therapist: More than 4 times per year    Marital Status: Married  Human resources officer Violence: Not At Risk (01/13/2021)   Humiliation, Afraid, Rape, and Kick questionnaire    Fear of Current or Ex-Partner: No    Emotionally Abused: No    Physically Abused: No    Sexually Abused: No    Outpatient Medications Prior to Visit  Medication Sig Dispense Refill   calcitonin, salmon, (MIACALCIN) 200 UNIT/ACT nasal spray Place 1 spray into alternate nostrils daily. 3.7 mL 12   Cholecalciferol (VITAMIN D-3) 125 MCG (5000 UT) TABS Take 1 tablet by mouth daily.     citalopram (CELEXA) 10 MG tablet Take 1 tablet (10 mg total) by mouth daily. 90 tablet 3   cyanocobalamin 100 MCG tablet Take by mouth.      losartan (COZAAR) 50 MG tablet TAKE 1 TABLET BY MOUTH EVERY DAY 90 tablet 1   Multiple Vitamin (MULTIVITAMIN) capsule Take 1 capsule by mouth daily.     No facility-administered medications prior to visit.    Allergies  Allergen  Reactions   Bee Venom Anaphylaxis    Bee stings   Codeine     Extreme Vomiting   Other Other (See Comments)    CDN - reaction unknown   Penicillins     Redness, swelling and itching Can take Keflex   Tetracyclines & Related     Yeast Infection and Stomach problems   Xylocaine Dental [Lidocaine-Epinephrine]     Review of Systems  Constitutional:  Negative for fever and malaise/fatigue.       (+) Hair Loss  HENT:  Negative for congestion.   Eyes:  Negative for blurred vision.  Respiratory:  Negative for shortness of breath.   Cardiovascular:  Negative for chest pain, palpitations and leg swelling.  Gastrointestinal:  Negative for abdominal pain, blood in stool and nausea.  Genitourinary:  Negative for dysuria and frequency.  Musculoskeletal:  Negative for falls.  Skin:  Positive for rash.       (+) Acne  Neurological:  Negative for dizziness, loss of consciousness and headaches.  Endo/Heme/Allergies:  Negative for environmental allergies.  Psychiatric/Behavioral:  Negative for depression. The patient is not nervous/anxious.        Objective:    Physical Exam Vitals and nursing note reviewed.  Constitutional:      General: She is not in acute distress.    Appearance: Normal appearance. She is not ill-appearing.  HENT:     Head: Normocephalic and atraumatic.     Right Ear: External ear normal.     Left Ear: External ear normal.  Eyes:     Extraocular Movements: Extraocular movements intact.     Pupils: Pupils are equal, round, and reactive to light.  Cardiovascular:     Rate and Rhythm: Normal rate and regular rhythm.     Heart sounds: Normal heart sounds. No murmur heard.    No gallop.  Pulmonary:     Effort: Pulmonary effort is normal.  No respiratory distress.     Breath sounds: Normal breath sounds. No wheezing or rales.  Skin:    General: Skin is warm and dry.     Findings: Lesion and rash present. Rash is papular.     Comments: Perioral papules and around nose + escoriations    Neurological:     Mental Status: She is alert and oriented to person, place, and time.  Psychiatric:        Mood and Affect: Mood normal.        Judgment: Judgment normal.     BP 118/70 (BP Location: Left Arm, Patient Position: Sitting, Cuff Size: Normal)   Pulse 100   Temp 98.1 F (36.7 C) (Oral)   Resp 18   Ht '5\' 5"'$  (1.651 m)   Wt 142 lb (64.4 kg)   SpO2 98%   BMI 23.63 kg/m  Wt Readings from Last 3 Encounters:  01/18/22 142 lb (64.4 kg)  06/18/21 140 lb 9.6 oz (63.8 kg)  06/09/21 140 lb 3.2 oz (63.6 kg)    Diabetic Foot Exam - Simple   No data filed    Lab Results  Component Value Date   WBC 6.7 06/09/2021   HGB 14.4 06/09/2021   HCT 43.1 06/09/2021   PLT 268.0 06/09/2021   GLUCOSE 61 (L) 06/09/2021   CHOL 207 (H) 06/18/2021   TRIG 60.0 06/18/2021   HDL 91.70 06/18/2021   LDLCALC 103 (H) 06/18/2021   ALT 15 06/09/2021   AST 24 06/09/2021   NA 137 06/09/2021   K 5.0 06/09/2021  CL 101 06/09/2021   CREATININE 0.91 06/09/2021   BUN 23 06/09/2021   CO2 30 06/09/2021   TSH 2.36 06/09/2021   HGBA1C 5.4 04/10/2020   MICROALBUR <0.7 05/01/2018    Lab Results  Component Value Date   TSH 2.36 06/09/2021   Lab Results  Component Value Date   WBC 6.7 06/09/2021   HGB 14.4 06/09/2021   HCT 43.1 06/09/2021   MCV 96.9 06/09/2021   PLT 268.0 06/09/2021   Lab Results  Component Value Date   NA 137 06/09/2021   K 5.0 06/09/2021   CO2 30 06/09/2021   GLUCOSE 61 (L) 06/09/2021   BUN 23 06/09/2021   CREATININE 0.91 06/09/2021   BILITOT 0.4 06/09/2021   ALKPHOS 58 06/09/2021   AST 24 06/09/2021   ALT 15 06/09/2021   PROT 6.9 06/09/2021   ALBUMIN 4.0 06/09/2021   CALCIUM 9.4 06/09/2021   GFR 61.82  06/09/2021   Lab Results  Component Value Date   CHOL 207 (H) 06/18/2021   Lab Results  Component Value Date   HDL 91.70 06/18/2021   Lab Results  Component Value Date   LDLCALC 103 (H) 06/18/2021   Lab Results  Component Value Date   TRIG 60.0 06/18/2021   Lab Results  Component Value Date   CHOLHDL 2 06/18/2021   Lab Results  Component Value Date   HGBA1C 5.4 04/10/2020       Assessment & Plan:   Problem List Items Addressed This Visit       Unprioritized   Adult acne - Primary    Cleocin bid Retin a Can still use benzoyl peroxide F/u derm       Relevant Medications   clindamycin (CLEOCIN T) 1 % external solution   tretinoin microspheres (RETIN-A MICRO) 0.04 % gel   Other Relevant Orders   Ambulatory referral to Dermatology   Meds ordered this encounter  Medications   clindamycin (CLEOCIN T) 1 % external solution    Sig: Apply topically 2 (two) times daily.    Dispense:  60 mL    Refill:  3   tretinoin microspheres (RETIN-A MICRO) 0.04 % gel    Sig: Apply topically at bedtime.    Dispense:  45 g    Refill:  3    I, [name], DO, personally preformed the services described in this documentation.  All medical record entries made by the scribe were at my direction and in my presence.  I have reviewed the chart and discharge instructions (if applicable) and agree that the record reflects my personal performance and is accurate and complete. 01/18/2022   I,Amber Collins,acting as a scribe for [name], DO.,have documented all relevant documentation on the behalf of [name], DO,as directed by  [name], DO while in the presence of [name], DO.    [name], DO

## 2022-01-19 NOTE — Telephone Encounter (Signed)
Called pt back and advised that medication was changed.

## 2022-01-19 NOTE — Telephone Encounter (Signed)
Pt called stating that she had received a call from [place] on this matter and wanted to follow up. Guerry Bruin was unavailable at the time and advised a note would be sent back to give pt a call later. Pt acknowledged understanding.

## 2022-01-25 ENCOUNTER — Telehealth: Payer: Self-pay | Admitting: Family Medicine

## 2022-01-25 ENCOUNTER — Ambulatory Visit (INDEPENDENT_AMBULATORY_CARE_PROVIDER_SITE_OTHER): Payer: Medicare HMO

## 2022-01-25 DIAGNOSIS — Z0000 Encounter for general adult medical examination without abnormal findings: Secondary | ICD-10-CM

## 2022-01-25 NOTE — Telephone Encounter (Signed)
Pt called stating that she has been using the meds that were prescribed to her for her acne and has been having issues with her skin being very dry. Pt has been using Eucerin cream to assist with the dryness but it has not helped at all. Pt is wanting to know if there is a moisturizer that can be used to help with this or if the meds may be too strong for her skin. Pt would like a call back on how to proceed.

## 2022-01-25 NOTE — Telephone Encounter (Signed)
Pt seen on 10/30 for this concern. Please advise

## 2022-01-25 NOTE — Progress Notes (Signed)
I connected with  Silver Huguenin Chatterjee on 01/25/22 by a audio enabled telemedicine application and verified that I am speaking with the correct person using two identifiers.  Patient Location: Home  Provider Location: Office/Clinic  I discussed the limitations of evaluation and management by telemedicine. The patient expressed understanding and agreed to proceed.   Subjective:   Brylinn Teaney is a 76 y.o. female who presents for Medicare Annual (Subsequent) preventive examination.  Review of Systems     Cardiac Risk Factors include: advanced age (>23mn, >>24women);dyslipidemia;hypertension     Objective:    There were no vitals filed for this visit. There is no height or weight on file to calculate BMI.     01/25/2022    2:41 PM 01/13/2021   10:32 AM 12/04/2018   10:14 AM 11/29/2017    9:53 AM 11/18/2015    9:10 AM 05/19/2015    2:56 PM  Advanced Directives  Does Patient Have a Medical Advance Directive? Yes Yes Yes Yes Yes Yes  Type of AParamedicof ABeckerLiving will HCoalingaLiving will HAvonLiving will HSeven PointsLiving will HBeasleyLiving will HMineral PointLiving will  Does patient want to make changes to medical advance directive? No - Patient declined  No - Patient declined  No - Patient declined No - Patient declined  Copy of HLebanonin Chart? Yes - validated most recent copy scanned in chart (See row information) Yes - validated most recent copy scanned in chart (See row information) Yes - validated most recent copy scanned in chart (See row information) Yes No - copy requested No - copy requested    Current Medications (verified) Outpatient Encounter Medications as of 01/25/2022  Medication Sig   calcitonin, salmon, (MIACALCIN) 200 UNIT/ACT nasal spray Place 1 spray into alternate nostrils daily.   Cholecalciferol (VITAMIN  D-3) 125 MCG (5000 UT) TABS Take 1 tablet by mouth daily.   citalopram (CELEXA) 10 MG tablet Take 1 tablet (10 mg total) by mouth daily.   clindamycin (CLEOCIN T) 1 % external solution Apply topically 2 (two) times daily.   COMIRNATY syringe    cyanocobalamin 100 MCG tablet Take by mouth.   FLUAD QUADRIVALENT 0.5 ML injection    losartan (COZAAR) 50 MG tablet TAKE 1 TABLET BY MOUTH EVERY DAY   Multiple Vitamin (MULTIVITAMIN) capsule Take 1 capsule by mouth daily.   tretinoin (RETIN-A) 0.025 % cream Apply topically at bedtime.   betamethasone dipropionate 0.05 % cream Apply topically 2 (two) times daily. (Patient not taking: Reported on 01/25/2022)   No facility-administered encounter medications on file as of 01/25/2022.    Allergies (verified) Bee venom, Codeine, Other, Penicillins, Tetracyclines & related, and Xylocaine dental [lidocaine-epinephrine]   History: Past Medical History:  Diagnosis Date   Anxiety    Cellulitis of chin 11/17/2020   Colon polyps    COVID-19 11/17/2020   Gastric ulcer    per patient; in the 1970s   H. pylori infection    Heart murmur    Hyperlipidemia    no treatment per pt   Hypertension    Liver cyst    Ovarian cyst 1984   Pneumonia    Past Surgical History:  Procedure Laterality Date   COLONOSCOPY  02/10/2016   DENTAL SURGERY  2000   dental implant   DENTAL SURGERY  2021   2 molars taken out    ESOPHAGOGASTRODUODENOSCOPY  11/06/2018  [facility]    ESOPHAGOGASTRODUODENOSCOPY  07/23/2019   [place]    EUS  07/23/2019   [facility] [place]    HEMORRHOID SURGERY     LAPAROSCOPIC LIVER CYST REMOVAL     OVARIAN CYST SURGERY     Family History  Problem Relation Age of Onset   Heart disease Father    Heart failure Sister    Heart disease Sister    Dementia Sister    Heart disease Brother 77   Heart disease Brother    Glaucoma Neg Hx    Colon cancer Neg Hx    Esophageal cancer Neg Hx    Pancreatic cancer Neg Hx     Stomach cancer Neg Hx    Rectal cancer Neg Hx    Colon polyps Neg Hx    Social History   Socioeconomic History   Marital status: Married    Spouse name: Tom   Number of children: 1   Years of education: Not on file   Highest education level: Not on file  Occupational History   Occupation: retired    Comment: retired  Tobacco Use   Smoking status: Former    Packs/day: 0.30    Years: 15.00    Total pack years: 4.50    Types: Cigarettes    Quit date: 11/17/1985    Years since quitting: 36.2   Smokeless tobacco: Never  Vaping Use   Vaping Use: Never used  Substance and Sexual Activity   Alcohol use: Yes    Alcohol/week: 14.0 standard drinks of alcohol    Types: 14 Glasses of wine per week    Comment: 2 glasses per night   Drug use: No   Sexual activity: Not Currently    Partners: Male  Other Topics Concern   Not on file  Social History Narrative   Exercise- Walks about 15-22 miles per week with womens group.   Social Determinants of Health   Financial Resource Strain: Low Risk  (01/25/2022)   Overall Financial Resource Strain (CARDIA)    Difficulty of Paying Living Expenses: Not hard at all  Food Insecurity: No Food Insecurity (01/25/2022)   Hunger Vital Sign    Worried About Running Out of Food in the Last Year: Never true    Ran Out of Food in the Last Year: Never true  Transportation Needs: No Transportation Needs (01/25/2022)   PRAPARE - Hydrologist (Medical): No    Lack of Transportation (Non-Medical): No  Physical Activity: Sufficiently Active (01/25/2022)   Exercise Vital Sign    Days of Exercise per Week: 7 days    Minutes of Exercise per Session: 40 min  Stress: No Stress Concern Present (01/25/2022)   [place]    Feeling of Stress : Not at all  Social Connections: [place] (01/25/2022)   Social Connection and Isolation Panel [NHANES]    Frequency of  Communication with Friends and Family: More than three times a week    Frequency of Social Gatherings with Friends and Family: More than three times a week    Attends Religious Services: 1 to 4 times per year    Active Member of [employer] or Organizations: Yes    Attends Archivist Meetings: 1 to 4 times per year    Marital Status: Married    Tobacco Counseling Counseling given: Not Answered   Clinical Intake:  Pre-visit preparation completed: Yes  Pain :  No/denies pain     Nutritional Risks: None Diabetes: No  How often do you need to have someone help you when you read instructions, pamphlets, or other written materials from your doctor or pharmacy?: 1 - Never  Diabetic?no  Interpreter Needed?: No  Information entered by :: Charlott Rakes, LPN   Activities of Daily Living    01/25/2022    2:43 PM 01/21/2022   10:18 AM  In your present state of health, do you have any difficulty performing the following activities:  Hearing? 0 0   0  Vision? 0 0   0  Difficulty concentrating or making decisions? 0 0   0  Walking or climbing stairs? 0 0   0  Dressing or bathing? 0 0   0  Doing errands, shopping? 0 0   0  Preparing Food and eating ? N N   N  Using the Toilet? N N   N  In the past six months, have you accidently leaked urine? Y Y   Y  Comment wears a pad   Do you have problems with loss of bowel control? N N   N  Managing your Medications? N N   N  Managing your Finances? N N   N  Housekeeping or managing your Housekeeping? Dorothey Baseman   N    Patient Care Team: Carollee Herter, Alferd Apa, DO as PCP - General (Family Medicine) Jettie Booze, MD as PCP - Cardiology (Cardiology) Otelia Sergeant, OD as Referring Physician (Optometry) Barbaraann Barthel, Sharyn Lull, MD as Consulting Physician (Sports Medicine) Roseanne Kaufman, MD as Consulting Physician (Orthopedic Surgery) Jacolyn Reedy, MD as Consulting Physician (Cardiology) Harley Hallmark, MD as Referring  Physician Jonetta Osgood, MD as Referring Physician (Gastroenterology)  Indicate any recent Medical Services you may have received from other than [facility] providers in the past year (date may be approximate).     Assessment:   This is a routine wellness examination for Muddy.  Hearing/Vision screen Hearing Screening - Comments:: Pt denies any hearing issues  Vision Screening - Comments:: Pt follows up with [facility]   Dietary issues and exercise activities discussed: Current Exercise Habits: Home exercise routine, Type of exercise: walking;Other - see comments, Time (Minutes): 45, Frequency (Times/Week): 7, Weekly Exercise (Minutes/Week): 315   Goals Addressed             This Visit's Progress    Patient Stated       Get back to the gym        Depression Screen    01/25/2022    2:38 PM 04/02/2021    1:09 PM 03/05/2021    2:03 PM 01/13/2021   10:34 AM 11/14/2020    9:37 AM 12/10/2019    9:04 AM 12/04/2018   10:14 AM  PHQ 2/9 Scores  PHQ - 2 Score 1 0 0 1 0 0 0  PHQ- 9 Score  0 0        Fall Risk    01/25/2022    2:41 PM 01/21/2022   10:18 AM 06/18/2021    9:16 AM 04/02/2021    1:09 PM 03/05/2021    2:03 PM  [place] in the past year? '1 1   1 '$ 0 1 1  Number falls in past yr: '1 1   1 '$ 0 1 1  Injury with Fall? '1 1   1 '$ 0 1 1  Comment compression Fx in back  Risk for fall due to : Impaired vision   History of fall(s) History of fall(s)  Follow up Falls prevention discussed  Falls evaluation completed Falls evaluation completed Falls evaluation completed    FALL RISK PREVENTION PERTAINING TO THE HOME:   Any stairs in or around the home? Yes  If so, are there any without handrails? No  Home free of loose throw rugs in walkways, pet beds, electrical cords, etc? Yes  Adequate lighting in your home to reduce risk of falls? Yes   ASSISTIVE DEVICES UTILIZED TO PREVENT FALLS:  Life alert? No  Use of a cane, walker or w/c? No  Grab bars in the  bathroom? Yes  Shower chair or bench in shower? Yes  Elevated toilet seat or a handicapped toilet? No   TIMED UP AND GO:  Was the test performed? No .   Cognitive Function:    11/18/2015    9:18 AM  MMSE - Mini Mental State Exam  Orientation to time 5  Orientation to Place 5  Registration 3  Attention/ Calculation 5  Recall 3  Language- name 2 objects 2  Language- repeat 1  Language- follow 3 step command 3  Language- read & follow direction 1  Write a sentence 1  Copy design 1  Total score 30        01/25/2022    2:44 PM  6CIT Screen  What Year? 0 points  What month? 0 points  What time? 0 points  Count back from 20 0 points  Months in reverse 0 points  Repeat phrase 0 points  Total Score 0 points    Immunizations Immunization History  Administered Date(s) Administered   Fluad Quad(high Dose 65+) 11/28/2018, 12/10/2019, 12/05/2020, 12/10/2021   Influenza, High Dose Seasonal PF 02/20/2016, 01/07/2017   Influenza,inj,Quad PF,6+ Mos 01/17/2015   Influenza-Unspecified 01/12/2018   PFIZER(Purple Top)SARS-COV-2 Vaccination 04/26/2019, 05/21/2019, 01/23/2020   Pfizer Covid-19 Vaccine Bivalent Booster 56yr & up 12/08/2020, 12/10/2021   Pneumococcal Conjugate-13 01/26/2013, 01/17/2015   Pneumococcal Polysaccharide-23 06/23/2011, 04/27/2012   Pneumococcal-Unspecified 01/26/2013   Td 03/22/2009   Tdap 07/05/2019   Zoster Recombinat (Shingrix) 11/18/2017, 03/04/2018   Zoster, Live 03/22/2012    TDAP status: Up to date  Flu Vaccine status: Up to date  Pneumococcal vaccine status: Up to date  Covid-19 vaccine status: Completed vaccines  Qualifies for Shingles Vaccine? Yes   Zostavax completed Yes   Shingrix Completed?: Yes  Screening Tests Health Maintenance  Topic Date Due   COLONOSCOPY (Pts [phone] coverage will need to be confirmed)  04/02/2022 (Originally 02/09/2021)   DEXA SCAN  03/25/2022   MAMMOGRAM  04/09/2022   Medicare Annual Wellness  (AWV)  01/26/2023   TETANUS/TDAP  07/04/2029   Pneumonia Vaccine 6581Years old  Completed   INFLUENZA VACCINE  Completed   COVID-19 Vaccine  Completed   Hepatitis C Screening  Completed   Zoster Vaccines- Shingrix  Completed   HPV VACCINES  Aged Out    Health Maintenance  There are no preventive care reminders to display for this patient.   Colorectal cancer screening: Type of screening: Colonoscopy. Completed 02/10/16. Repeat every 5 years  Mammogram status: Completed 04/09/21. Repeat every year  Bone Density status: Completed 03/25/20. Results reflect: Bone density results: OSTEOPENIA. Repeat every 2 years.  Additional Screening:  Hepatitis C Screening:  Completed 05/19/15  Vision Screening: Recommended annual ophthalmology exams for early detection of glaucoma and other disorders of the eye. Is the patient up to date with  their annual eye exam?  Yes  Who is the provider or what is the name of the office in which the patient attends annual eye exams? [facility]  If pt is not established with a provider, would they like to be referred to a provider to establish care? No .   Dental Screening: Recommended annual dental exams for proper oral hygiene  Community Resource Referral / Chronic Care Management: CRR required this visit?  No   CCM required this visit?  No      Plan:     I have personally reviewed and noted the following in the patient's chart:   Medical and social history Use of alcohol, tobacco or illicit drugs  Current medications and supplements including opioid prescriptions. Patient is not currently taking opioid prescriptions. Functional ability and status Nutritional status Physical activity Advanced directives List of other physicians Hospitalizations, surgeries, and ER visits in previous 12 months Vitals Screenings to include cognitive, depression, and falls Referrals and appointments  In addition, I have reviewed and discussed with patient  certain preventive protocols, quality metrics, and best practice recommendations. A written personalized care plan for preventive services as well as general preventive health recommendations were provided to patient.     Willette Brace, LPN   [id number]   Nurse Notes: none

## 2022-01-25 NOTE — Patient Instructions (Signed)
Ms. Kristen Schultz , Thank you for taking time to come for your Medicare Wellness Visit. I appreciate your ongoing commitment to your health goals. Please review the following plan we discussed and let me know if I can assist you in the future.   These are the goals we discussed:  Goals      Increase physical activity        This is a list of the screening recommended for you and due dates:  Health Maintenance  Topic Date Due   COVID-19 Vaccine (5 - Pfizer series) 04/09/2021   Colon Cancer Screening  04/02/2022*   DEXA scan (bone density measurement)  03/25/2022   Mammogram  04/09/2022   Medicare Annual Wellness Visit  01/26/2023   Tetanus Vaccine  07/04/2029   Pneumonia Vaccine  Completed   Flu Shot  Completed   Hepatitis C Screening: USPSTF Recommendation to screen - Ages 18-79 yo.  Completed   Zoster (Shingles) Vaccine  Completed   HPV Vaccine  Aged Out  *Topic was postponed. The date shown is not the original due date.    Advanced directives: copies in chart   Conditions/risks identified: get back to the gym   Next appointment: Follow up in one year for your annual wellness visit    Preventive Care 65 Years and Older, Female Preventive care refers to lifestyle choices and visits with your health care provider that can promote health and wellness. What does preventive care include? A yearly physical exam. This is also called an annual well check. Dental exams once or twice a year. Routine eye exams. Ask your health care provider how often you should have your eyes checked. Personal lifestyle choices, including: Daily care of your teeth and gums. Regular physical activity. Eating a healthy diet. Avoiding tobacco and drug use. Limiting alcohol use. Practicing safe sex. Taking low-dose aspirin every day. Taking vitamin and mineral supplements as recommended by your health care provider. What happens during an annual well check? The services and screenings done by your health  care provider during your annual well check will depend on your age, overall health, lifestyle risk factors, and family history of disease. Counseling  Your health care provider may ask you questions about your: Alcohol use. Tobacco use. Drug use. Emotional well-being. Home and relationship well-being. Sexual activity. Eating habits. History of falls. Memory and ability to understand (cognition). Work and work Statistician. Reproductive health. Screening  You may have the following tests or measurements: Height, weight, and BMI. Blood pressure. Lipid and cholesterol levels. These may be checked every 5 years, or more frequently if you are over 55 years old. Skin check. Lung cancer screening. You may have this screening every year starting at age 65 if you have a 30-pack-year history of smoking and currently smoke or have quit within the past 15 years. Fecal occult blood test (FOBT) of the stool. You may have this test every year starting at age 54. Flexible sigmoidoscopy or colonoscopy. You may have a sigmoidoscopy every 5 years or a colonoscopy every 10 years starting at age 19. Hepatitis C blood test. Hepatitis B blood test. Sexually transmitted disease (STD) testing. Diabetes screening. This is done by checking your blood sugar (glucose) after you have not eaten for a while (fasting). You may have this done every 1-3 years. Bone density scan. This is done to screen for osteoporosis. You may have this done starting at age 65. Mammogram. This may be done every 1-2 years. Talk to your health care provider  about how often you should have regular mammograms. Talk with your health care provider about your test results, treatment options, and if necessary, the need for more tests. Vaccines  Your health care provider may recommend certain vaccines, such as: Influenza vaccine. This is recommended every year. Tetanus, diphtheria, and acellular pertussis (Tdap, Td) vaccine. You may need a Td  booster every 10 years. Zoster vaccine. You may need this after age 2. Pneumococcal 13-valent conjugate (PCV13) vaccine. One dose is recommended after age 19. Pneumococcal polysaccharide (PPSV23) vaccine. One dose is recommended after age 72. Talk to your health care provider about which screenings and vaccines you need and how often you need them. This information is not intended to replace advice given to you by your health care provider. Make sure you discuss any questions you have with your health care provider. Document Released: 04/04/2015 Document Revised: 11/26/2015 Document Reviewed: 01/07/2015 Elsevier Interactive Patient Education  2017 [place] Prevention in the Home Falls can cause injuries. They can happen to people of all ages. There are many things you can do to make your home safe and to help prevent falls. What can I do on the outside of my home? Regularly fix the edges of walkways and driveways and fix any cracks. Remove anything that might make you trip as you walk through a door, such as a raised step or threshold. Trim any bushes or trees on the path to your home. Use bright outdoor lighting. Clear any walking paths of anything that might make someone trip, such as rocks or tools. Regularly check to see if handrails are loose or broken. Make sure that both sides of any steps have handrails. Any raised decks and porches should have guardrails on the edges. Have any leaves, snow, or ice cleared regularly. Use sand or salt on walking paths during winter. Clean up any spills in your garage right away. This includes oil or grease spills. What can I do in the bathroom? Use night lights. Install grab bars by the toilet and in the tub and shower. Do not use towel bars as grab bars. Use non-skid mats or decals in the tub or shower. If you need to sit down in the shower, use a plastic, non-slip stool. Keep the floor dry. Clean up any water that spills on the floor  as soon as it happens. Remove soap buildup in the tub or shower regularly. Attach bath mats securely with double-sided non-slip rug tape. Do not have throw rugs and other things on the floor that can make you trip. What can I do in the bedroom? Use night lights. Make sure that you have a light by your bed that is easy to reach. Do not use any sheets or blankets that are too big for your bed. They should not hang down onto the floor. Have a firm chair that has side arms. You can use this for support while you get dressed. Do not have throw rugs and other things on the floor that can make you trip. What can I do in the kitchen? Clean up any spills right away. Avoid walking on wet floors. Keep items that you use a lot in easy-to-reach places. If you need to reach something above you, use a strong step stool that has a grab bar. Keep electrical cords out of the way. Do not use floor polish or wax that makes floors slippery. If you must use wax, use non-skid floor wax. Do not have throw rugs and  other things on the floor that can make you trip. What can I do with my stairs? Do not leave any items on the stairs. Make sure that there are handrails on both sides of the stairs and use them. Fix handrails that are broken or loose. Make sure that handrails are as long as the stairways. Check any carpeting to make sure that it is firmly attached to the stairs. Fix any carpet that is loose or worn. Avoid having throw rugs at the top or bottom of the stairs. If you do have throw rugs, attach them to the floor with carpet tape. Make sure that you have a light switch at the top of the stairs and the bottom of the stairs. If you do not have them, ask someone to add them for you. What else can I do to help prevent falls? Wear shoes that: Do not have high heels. Have rubber bottoms. Are comfortable and fit you well. Are closed at the toe. Do not wear sandals. If you use a stepladder: Make sure that it is  fully opened. Do not climb a closed stepladder. Make sure that both sides of the stepladder are locked into place. Ask someone to hold it for you, if possible. Clearly mark and make sure that you can see: Any grab bars or handrails. First and last steps. Where the edge of each step is. Use tools that help you move around (mobility aids) if they are needed. These include: Canes. Walkers. Scooters. Crutches. Turn on the lights when you go into a dark area. Replace any light bulbs as soon as they burn out. Set up your furniture so you have a clear path. Avoid moving your furniture around. If any of your floors are uneven, fix them. If there are any pets around you, be aware of where they are. Review your medicines with your doctor. Some medicines can make you feel dizzy. This can increase your chance of falling. Ask your doctor what other things that you can do to help prevent falls. This information is not intended to replace advice given to you by your health care provider. Make sure you discuss any questions you have with your health care provider. Document Released: 01/02/2009 Document Revised: 08/14/2015 Document Reviewed: 04/12/2014 Elsevier Interactive Patient Education  2017 [employer].

## 2022-01-26 NOTE — Telephone Encounter (Signed)
Spoke with patient. Pt states she picked up Cerave to try. Skin is showing some improvement

## 2022-02-25 ENCOUNTER — Other Ambulatory Visit: Payer: Self-pay | Admitting: Family Medicine

## 2022-02-25 DIAGNOSIS — Z1231 Encounter for screening mammogram for malignant neoplasm of breast: Secondary | ICD-10-CM

## 2022-04-22 ENCOUNTER — Ambulatory Visit: Payer: Medicare HMO

## 2022-04-23 ENCOUNTER — Ambulatory Visit
Admission: RE | Admit: 2022-04-23 | Discharge: 2022-04-23 | Disposition: A | Discharge status: Home / Self Care | Payer: Medicare HMO | Source: Ambulatory Visit | Attending: Family Medicine | Admitting: Family Medicine

## 2022-04-23 DIAGNOSIS — Z1231 Encounter for screening mammogram for malignant neoplasm of breast: Secondary | ICD-10-CM | POA: Diagnosis not present

## 2022-05-27 IMAGING — DX DG RIBS W/ CHEST 3+V BILAT
5 series · 5 of 5 positions shown · non-contrast
Comparison: Chest x-ray dated December 14, 2019

CLINICAL DATA: Fall

EXAM:
BILATERAL RIBS AND CHEST - 4+ VIEW

[chest pa]
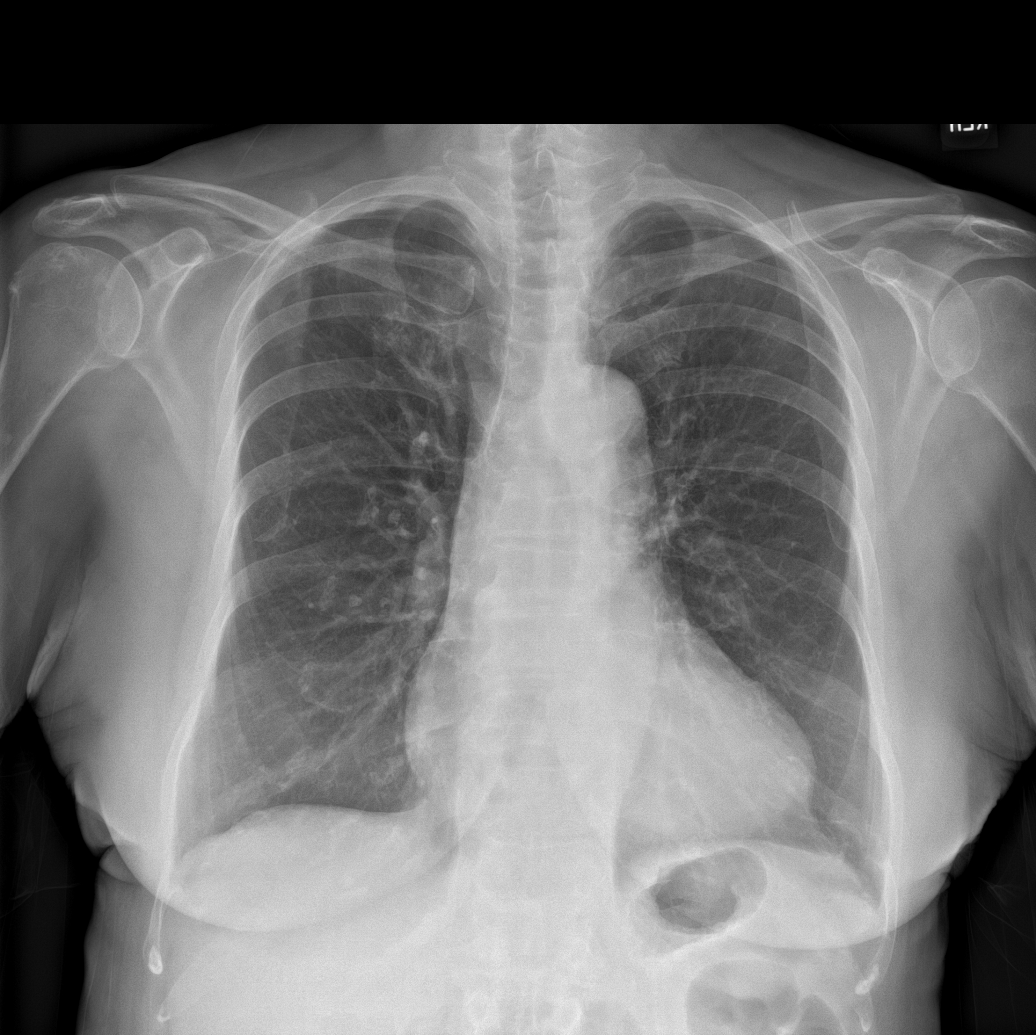

[rib ap (1 of 2)]
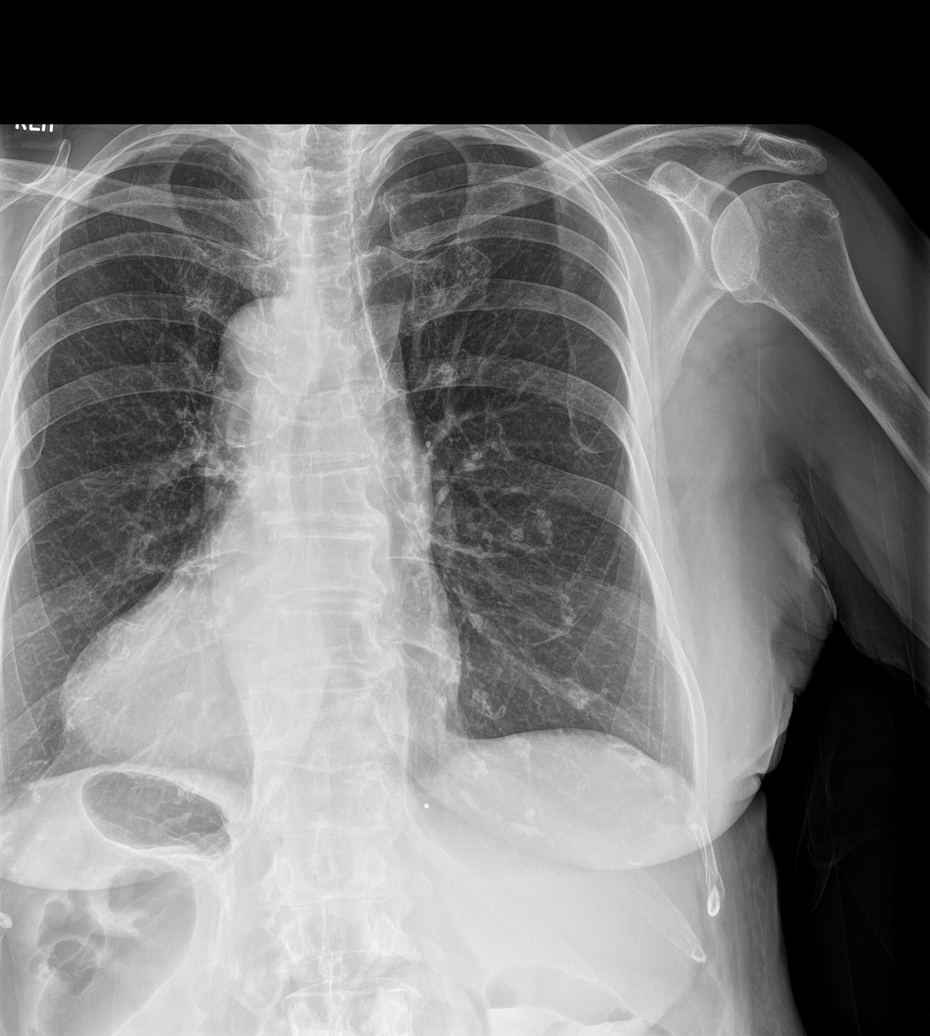

[rib ap (2 of 2)]
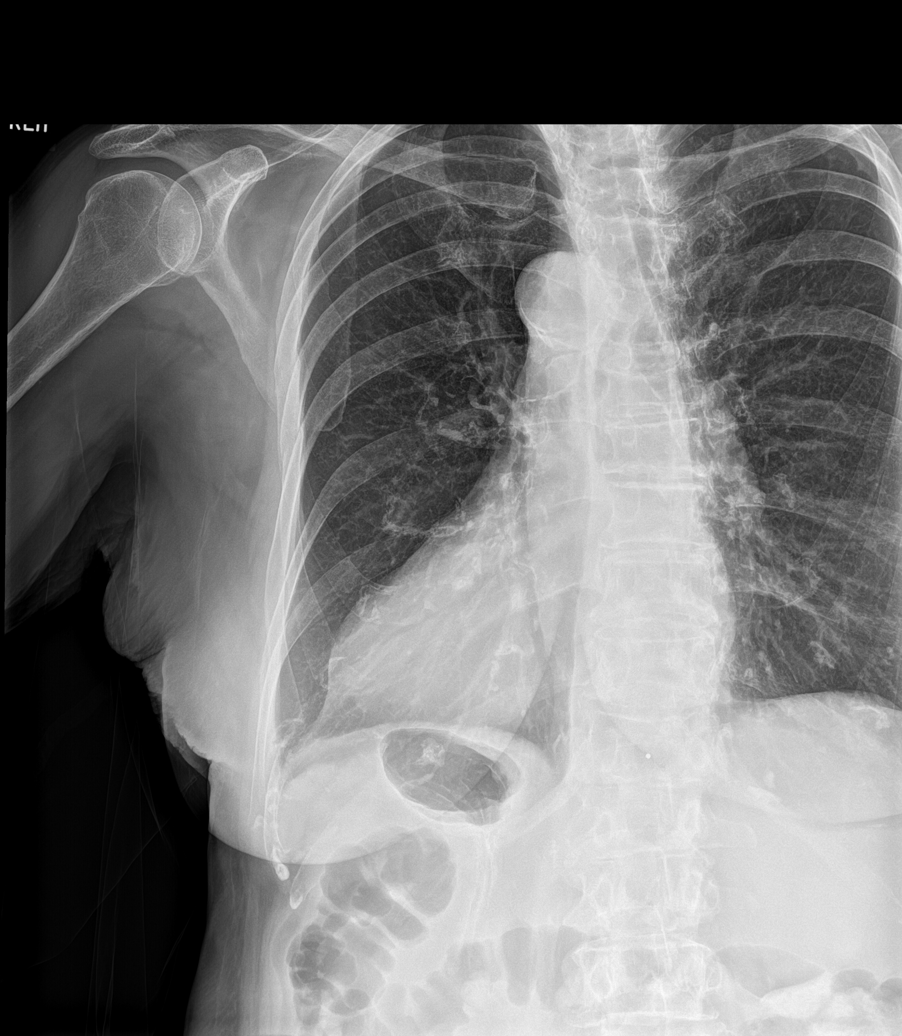

[rib obl (1 of 2)]
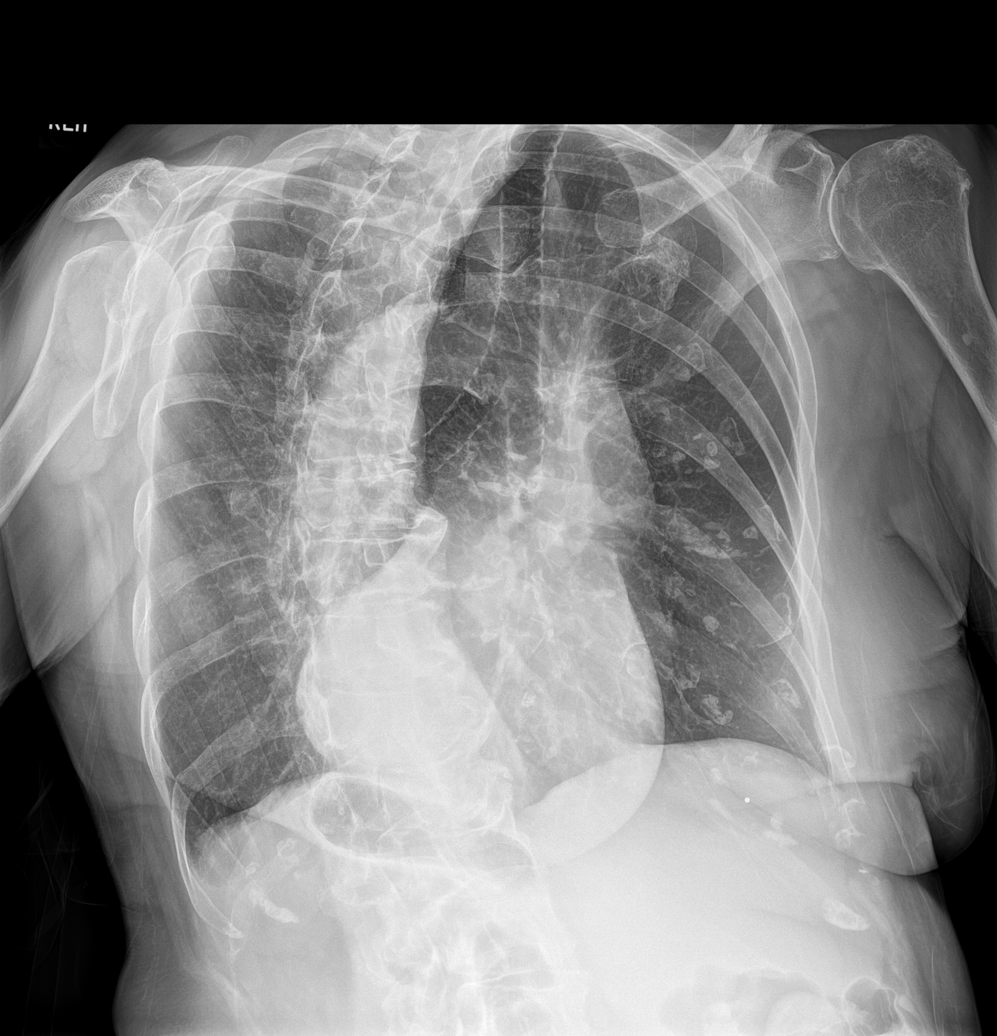

[rib obl (2 of 2)]
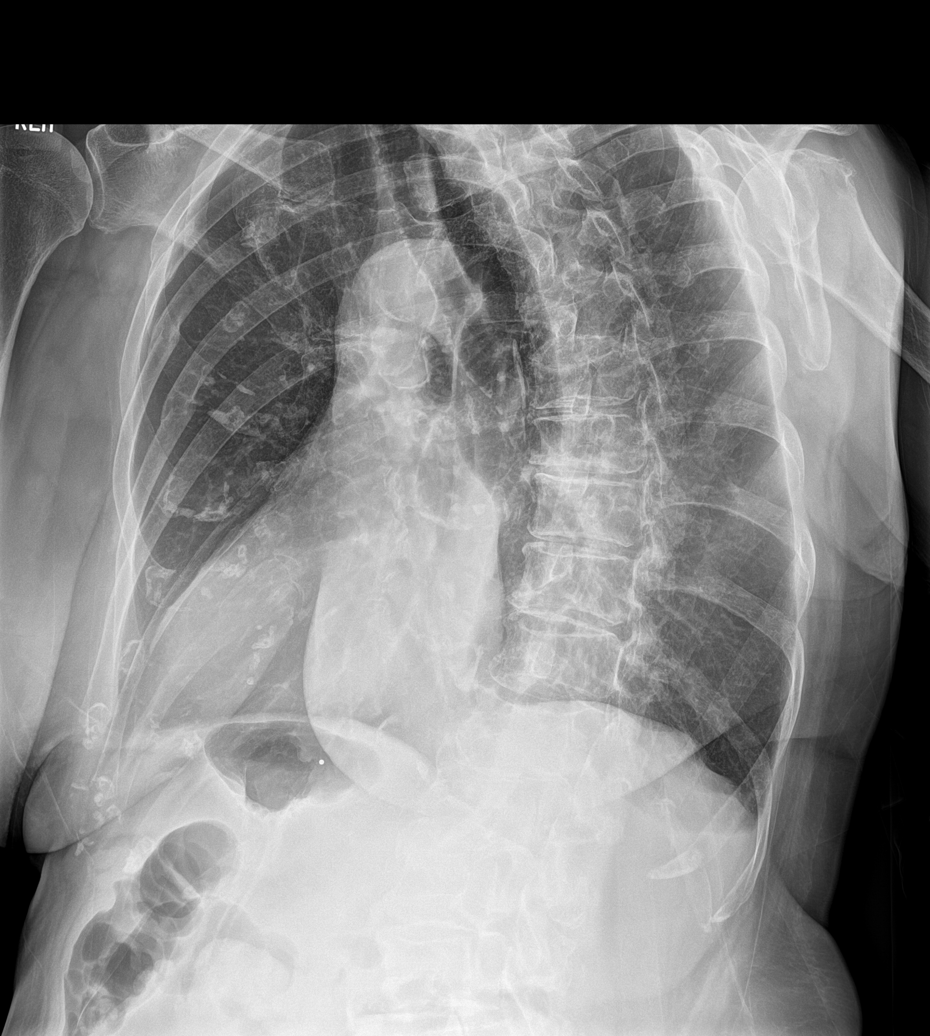

[5 of 5 positions shown; findings below may reference images not displayed]

FINDINGS: No fracture or other bone lesions are seen involving the ribs.
Chronic appearing fractures of the left third-fifth lateral ribs.
There is no evidence of pneumothorax or pleural effusion. Both lungs
are clear. Heart size and mediastinal contours are within normal
limits.
IMPRESSION: No evidence of acute rib fracture. Chronic appearing fractures of
the left third-fifth lateral ribs.

## 2022-06-07 ENCOUNTER — Other Ambulatory Visit: Payer: Self-pay | Admitting: Family Medicine

## 2022-06-07 DIAGNOSIS — I10 Essential (primary) hypertension: Secondary | ICD-10-CM

## 2022-06-29 ENCOUNTER — Encounter: Payer: Self-pay | Admitting: Family Medicine

## 2022-06-29 ENCOUNTER — Encounter: Payer: Self-pay | Admitting: Gastroenterology

## 2022-06-29 ENCOUNTER — Ambulatory Visit (INDEPENDENT_AMBULATORY_CARE_PROVIDER_SITE_OTHER): Payer: Medicare HMO | Admitting: Family Medicine

## 2022-06-29 VITALS — BP 140/90 | HR 79 | Temp 97.8°F | Resp 20 | Ht 65.0 in | Wt 145.4 lb

## 2022-06-29 DIAGNOSIS — M8580 Other specified disorders of bone density and structure, unspecified site: Secondary | ICD-10-CM | POA: Diagnosis not present

## 2022-06-29 DIAGNOSIS — R69 Illness, unspecified: Secondary | ICD-10-CM | POA: Diagnosis not present

## 2022-06-29 DIAGNOSIS — I10 Essential (primary) hypertension: Secondary | ICD-10-CM | POA: Diagnosis not present

## 2022-06-29 DIAGNOSIS — R5383 Other fatigue: Secondary | ICD-10-CM

## 2022-06-29 DIAGNOSIS — E785 Hyperlipidemia, unspecified: Secondary | ICD-10-CM

## 2022-06-29 DIAGNOSIS — R1013 Epigastric pain: Secondary | ICD-10-CM | POA: Diagnosis not present

## 2022-06-29 DIAGNOSIS — F4323 Adjustment disorder with mixed anxiety and depressed mood: Secondary | ICD-10-CM

## 2022-06-29 LAB — CBC WITH DIFFERENTIAL/PLATELET
Basophils Absolute: 0.1 10*3/uL (ref 0.0–0.1)
Basophils Relative: 0.9 % (ref 0.0–3.0)
Eosinophils Absolute: 0.1 10*3/uL (ref 0.0–0.7)
Eosinophils Relative: 1.2 % (ref 0.0–5.0)
HCT: 42.3 % (ref 36.0–46.0)
Hemoglobin: 14.3 g/dL (ref 12.0–15.0)
Lymphocytes Relative: 21 % (ref 12.0–46.0)
Lymphs Abs: 1.4 10*3/uL (ref 0.7–4.0)
MCHC: 33.8 g/dL (ref 30.0–36.0)
MCV: 96.7 fl (ref 78.0–100.0)
Monocytes Absolute: 0.8 10*3/uL (ref 0.1–1.0)
Monocytes Relative: 12.1 % — ABNORMAL HIGH (ref 3.0–12.0)
Neutro Abs: 4.4 10*3/uL (ref 1.4–7.7)
Neutrophils Relative %: 64.8 % (ref 43.0–77.0)
Platelets: 288 10*3/uL (ref 150.0–400.0)
RBC: 4.38 Mil/uL (ref 3.87–5.11)
RDW: 13 % (ref 11.5–15.5)
WBC: 6.8 10*3/uL (ref 4.0–10.5)

## 2022-06-29 LAB — COMPREHENSIVE METABOLIC PANEL
ALT: 18 U/L (ref 0–35)
AST: 26 U/L (ref 0–37)
Albumin: 4.1 g/dL (ref 3.5–5.2)
Alkaline Phosphatase: 59 U/L (ref 39–117)
BUN: 13 mg/dL (ref 6–23)
CO2: 26 mEq/L (ref 19–32)
Calcium: 9.4 mg/dL (ref 8.4–10.5)
Chloride: 99 mEq/L (ref 96–112)
Creatinine, Ser: 0.65 mg/dL (ref 0.40–1.20)
GFR: 85.59 mL/min (ref >60.00)
Glucose, Bld: 80 mg/dL (ref 70–99)
Potassium: 4.4 mEq/L (ref 3.5–5.1)
Sodium: 136 mEq/L (ref 135–145)
Total Bilirubin: 0.6 mg/dL (ref 0.2–1.2)
Total Protein: 6.8 g/dL (ref 6.0–8.3)

## 2022-06-29 LAB — LIPID PANEL
Cholesterol: 213 mg/dL — ABNORMAL HIGH (ref 0–200)
HDL: 90.6 mg/dL (ref >39.00)
LDL Cholesterol: 109 mg/dL — ABNORMAL HIGH (ref 0–99)
NonHDL: 122.48
Total CHOL/HDL Ratio: 2
Triglycerides: 65 mg/dL (ref 0.0–149.0)
VLDL: 13 mg/dL (ref 0.0–40.0)

## 2022-06-29 LAB — VITAMIN B12: Vitamin B-12: 149 pg/mL — ABNORMAL LOW (ref 211–911)

## 2022-06-29 LAB — TSH: TSH: 1.37 u[IU]/mL (ref 0.35–5.50)

## 2022-06-29 LAB — VITAMIN D 25 HYDROXY (VIT D DEFICIENCY, FRACTURES): VITD: 73.98 ng/mL (ref 30.00–100.00)

## 2022-06-29 MED ORDER — PANTOPRAZOLE SODIUM 40 MG PO TBEC
40.0000 mg | DELAYED_RELEASE_TABLET | Freq: Every day | ORAL | 3 refills | Status: DC
Start: 2022-06-29 — End: 2022-10-04

## 2022-06-29 MED ORDER — CITALOPRAM HYDROBROMIDE 20 MG PO TABS: 20.0000 mg | ORAL_TABLET | Freq: Every day | ORAL | 3 refills | Status: DC

## 2022-06-29 NOTE — Assessment & Plan Note (Signed)
Poorly controlled will alter medications, encouraged DASH diet, minimize caffeine and obtain adequate sleep. Report concerning symptoms and follow up as directed and as needed 

## 2022-06-29 NOTE — Progress Notes (Signed)
Subjective:   By signing my name below, I, [name], attest that this documentation has been prepared under the direction and in the presence of Donato Schultz, DO. 06/29/2022   Patient ID: Kristen Schultz, female    DOB: 1945/06/06, 77 y.o.   MRN: [id number]  Chief Complaint  Patient presents with   Fatigue    Pt states having fatigue and states feeling emotional and trouble sleeping, trouble focusing. Pt report bloating as well    HPI Patient is in today for office visit.   She complains of depression and putting herself down (not being able to withhold task and responsibilities). She drinks 2 alcoholic beverages a day. She has difficulty sleeping at night and gets around 3-4 hours of sleep every night.  She injured her back a year ago and she still has pain from the injury.  She occasionally has sore throat.  She has occasional heart palpitations.   Her sister died 2023/01/09 of last year after they both had Covid-19, otherwise she has no changes to her family medical history.   Past Medical History:  Diagnosis Date   Anxiety    Cellulitis of chin 11/17/2020   Colon polyps    COVID-19 11/17/2020   Gastric ulcer    per patient; in the 1970s   H. pylori infection    Heart murmur    Hyperlipidemia    no treatment per pt   Hypertension    Liver cyst    Ovarian cyst 1984   Pneumonia     Past Surgical History:  Procedure Laterality Date   COLONOSCOPY  02/10/2016   DENTAL SURGERY  2000   dental implant   DENTAL SURGERY  2021   2 molars taken out    ESOPHAGOGASTRODUODENOSCOPY  11/06/2018   [facility]    ESOPHAGOGASTRODUODENOSCOPY  07/23/2019   [facility]    EUS  07/23/2019   [facility]    HEMORRHOID SURGERY     LAPAROSCOPIC LIVER CYST REMOVAL     OVARIAN CYST SURGERY      Family History  Problem Relation Age of Onset   Heart disease Father    Heart failure Sister    Heart disease Sister    Dementia Sister    Heart  disease Brother 41   Heart disease Brother    Alzheimer's disease Brother    Glaucoma Neg Hx    Colon cancer Neg Hx    Esophageal cancer Neg Hx    Pancreatic cancer Neg Hx    Stomach cancer Neg Hx    Rectal cancer Neg Hx    Colon polyps Neg Hx     Social History   Socioeconomic History   Marital status: Married    Spouse name: Tom   Number of children: 1   Years of education: Not on file   Highest education level: Not on file  Occupational History   Occupation: retired    Comment: retired  Tobacco Use   Smoking status: Former    Packs/day: 0.30    Years: 15.00    Additional pack years: 0.00    Total pack years: 4.50    Types: Cigarettes    Quit date: 11/17/1985    Years since quitting: 36.6   Smokeless tobacco: Never  Vaping Use   Vaping Use: Never used  Substance and Sexual Activity   Alcohol use: Yes    Alcohol/week: 14.0 standard drinks of alcohol    Types: 14  Glasses of wine per week    Comment: 2 glasses per night   Drug use: No   Sexual activity: Not Currently    Partners: Male  Other Topics Concern   Not on file  Social History Narrative   Exercise- Walks about 15-22 miles per week with womens group.   Social Determinants of Health   Financial Resource Strain: Low Risk  (01/25/2022)   Overall Financial Resource Strain (CARDIA)    Difficulty of Paying Living Expenses: Not hard at all  Food Insecurity: No Food Insecurity (01/25/2022)   Hunger Vital Sign    Worried About Running Out of Food in the Last Year: Never true    Ran Out of Food in the Last Year: Never true  Transportation Needs: No Transportation Needs (01/25/2022)   PRAPARE - Administrator, Civil ServiceTransportation    Lack of Transportation (Medical): No    Lack of Transportation (Non-Medical): No  Physical Activity: Sufficiently Active (01/25/2022)   Exercise Vital Sign    Days of Exercise per Week: 7 days    Minutes of Exercise per Session: 40 min  Stress: No Stress Concern Present (01/25/2022)   [employer] - Occupational Stress Questionnaire    Feeling of Stress : Not at all  Social Connections: Socially Integrated (01/25/2022)   Social Connection and Isolation Panel [NHANES]    Frequency of Communication with Friends and Family: More than three times a week    Frequency of Social Gatherings with Friends and Family: More than three times a week    Attends Religious Services: 1 to 4 times per year    Active Member of [employer] or Organizations: Yes    Attends BankerClub or Organization Meetings: 1 to 4 times per year    Marital Status: Married  Catering managerntimate Partner Violence: Not At Risk (01/25/2022)   Humiliation, Afraid, Rape, and Kick questionnaire    Fear of Current or Ex-Partner: No    Emotionally Abused: No    Physically Abused: No    Sexually Abused: No    Outpatient Medications Prior to Visit  Medication Sig Dispense Refill   Cholecalciferol (VITAMIN D-3) 125 MCG (5000 UT) TABS Take 1 tablet by mouth daily.     clindamycin (CLEOCIN T) 1 % external solution Apply topically 2 (two) times daily. 60 mL 3   COMIRNATY syringe      cyanocobalamin 100 MCG tablet Take by mouth.     FLUAD QUADRIVALENT 0.5 ML injection      losartan (COZAAR) 50 MG tablet Take 1 tablet (50 mg total) by mouth daily. 90 tablet 0   Multiple Vitamin (MULTIVITAMIN) capsule Take 1 capsule by mouth daily.     tretinoin (RETIN-A) 0.025 % cream Apply topically at bedtime. 45 g 0   citalopram (CELEXA) 10 MG tablet Take 1 tablet (10 mg total) by mouth daily. 90 tablet 3   betamethasone dipropionate 0.05 % cream Apply topically 2 (two) times daily. (Patient not taking: Reported on 01/25/2022)     calcitonin, salmon, (MIACALCIN) 200 UNIT/ACT nasal spray Place 1 spray into alternate nostrils daily. 3.7 mL 12   No facility-administered medications prior to visit.    Allergies  Allergen Reactions   Bee Venom Anaphylaxis    Bee stings   Codeine     Extreme Vomiting   Other Other (See Comments)    CDN -  reaction unknown   Penicillins     Redness, swelling and itching Can take Keflex   Tetracyclines & Related  Yeast Infection and Stomach problems   Xylocaine Dental [Lidocaine-Epinephrine]     Review of Systems  Constitutional:  Positive for malaise/fatigue. Negative for fever.  HENT:  Positive for sore throat. Negative for congestion.   Eyes:  Negative for blurred vision.  Respiratory:  Negative for shortness of breath.   Cardiovascular:  Positive for palpitations. Negative for chest pain and leg swelling.  Gastrointestinal:  Negative for abdominal pain, blood in stool and nausea.  Genitourinary:  Negative for dysuria and frequency.  Musculoskeletal:  Positive for back pain. Negative for falls.  Skin:  Negative for rash.  Neurological:  Negative for dizziness, loss of consciousness and headaches.  Endo/Heme/Allergies:  Negative for environmental allergies.  Psychiatric/Behavioral:  Positive for depression. The patient is not nervous/anxious.        (+) sleeping problems        Objective:    Physical Exam Vitals and nursing note reviewed.  Constitutional:      General: She is not in acute distress.    Appearance: Normal appearance. She is well-developed.  HENT:     Head: Normocephalic and atraumatic.     Right Ear: External ear normal.     Left Ear: External ear normal.  Eyes:     Extraocular Movements: Extraocular movements intact.     Conjunctiva/sclera: Conjunctivae normal.     Pupils: Pupils are equal, round, and reactive to light.  Neck:     Thyroid: No thyromegaly.     Vascular: No carotid bruit or JVD.  Cardiovascular:     Rate and Rhythm: Normal rate and regular rhythm.     Heart sounds: Normal heart sounds. No murmur heard.    No gallop.  Pulmonary:     Effort: Pulmonary effort is normal. No respiratory distress.     Breath sounds: Normal breath sounds. No wheezing or rales.  Chest:     Chest wall: No tenderness.  Musculoskeletal:     Cervical back:  Normal range of motion and neck supple.  Skin:    General: Skin is warm and dry.  Neurological:     Mental Status: She is alert and oriented to person, place, and time.  Psychiatric:        Judgment: Judgment normal.     BP (!) 140/90 (BP Location: Left Arm, Patient Position: Sitting, Cuff Size: Normal)   Pulse 79   Temp 97.8 F (36.6 C) (Oral)   Resp 20   Ht 5\' 5"  (1.651 m)   Wt 145 lb 6.4 oz (66 kg)   SpO2 98%   BMI 24.20 kg/m  Wt Readings from Last 3 Encounters:  06/29/22 145 lb 6.4 oz (66 kg)  01/18/22 142 lb (64.4 kg)  06/18/21 140 lb 9.6 oz (63.8 kg)       Assessment & Plan:  Essential hypertension Assessment & Plan: Poorly controlled will alter medications, encouraged DASH diet, minimize caffeine and obtain adequate sleep. Report concerning symptoms and follow up as directed and as needed   Orders: -     CBC with Differential/Platelet -     Comprehensive metabolic panel -     Lipid panel  Hyperlipidemia, unspecified hyperlipidemia type Assessment & Plan: Encourage heart healthy diet such as MIND or DASH diet, increase exercise, avoid trans fats, simple carbohydrates and processed foods, consider a krill or fish or flaxseed oil cap daily.    Orders: -     Lipid panel  Other fatigue Assessment & Plan: Check labs   Orders: -  CBC with Differential/Platelet -     Comprehensive metabolic panel -     Lipid panel -     TSH -     Vitamin B12 -     VITAMIN D 25 Hydroxy (Vit-D Deficiency, Fractures)  Dyspepsia -     H. pylori breath test -     Pantoprazole Sodium; Take 1 tablet (40 mg total) by mouth daily.  Dispense: 30 tablet; Refill: 3 -     Ambulatory referral to Gastroenterology  Osteopenia, unspecified location -     DG Bone Density; Future  Adjustment disorder with mixed anxiety and depressed mood -     Citalopram Hydrobromide; Take 1 tablet (20 mg total) by mouth daily.  Dispense: 30 tablet; Refill: 3    I, Donato Schultz, DO,  personally preformed the services described in this documentation.  All medical record entries made by the scribe were at my direction and in my presence.  I have reviewed the chart and discharge instructions (if applicable) and agree that the record reflects my personal performance and is accurate and complete. 06/29/2022  Donato Schultz, DO   Dennis Bast Oliver,acting as a scribe for Donato Schultz, DO.,have documented all relevant documentation on the behalf of Donato Schultz, DO,as directed by  Donato Schultz, DO while in the presence of Donato Schultz, DO.

## 2022-06-29 NOTE — Assessment & Plan Note (Signed)
Check labs 

## 2022-06-29 NOTE — Assessment & Plan Note (Signed)
Encourage heart healthy diet such as MIND or DASH diet, increase exercise, avoid trans fats, simple carbohydrates and processed foods, consider a krill or fish or flaxseed oil cap daily.  °

## 2022-06-30 LAB — H. PYLORI BREATH TEST: H. pylori Breath Test: NOT DETECTED

## 2022-07-04 ENCOUNTER — Encounter: Payer: Self-pay | Admitting: Family Medicine

## 2022-07-14 ENCOUNTER — Ambulatory Visit (INDEPENDENT_AMBULATORY_CARE_PROVIDER_SITE_OTHER): Payer: Medicare HMO

## 2022-07-14 DIAGNOSIS — I10 Essential (primary) hypertension: Secondary | ICD-10-CM

## 2022-07-14 DIAGNOSIS — E538 Deficiency of other specified B group vitamins: Secondary | ICD-10-CM

## 2022-07-14 MED ORDER — CYANOCOBALAMIN 1000 MCG/ML IJ SOLN
1000.0000 ug | Freq: Once | INTRAMUSCULAR | Status: AC
Start: 2022-07-14 — End: 2022-07-14
  Administered 2022-07-14: 1000 ug via INTRAMUSCULAR

## 2022-07-14 MED ORDER — AMLODIPINE BESYLATE 2.5 MG PO TABS
2.5000 mg | ORAL_TABLET | Freq: Every day | ORAL | 3 refills | Status: DC
Start: 2022-07-14 — End: 2022-11-01

## 2022-07-14 NOTE — Progress Notes (Signed)
Pt here for 1/4 weekly B12 injection per Dr. Laury Axon   B12 given IM left deltoid, and pt tolerated injection well.  Next B12 injection scheduled for 07/21/2022     Pt here for Blood pressure check per   Pt currently takes: Losartan  Citalopram    Pt reports compliance with medication.  BP today @ = 140/84 L arm R arm 128/86 HR = 67  Pt advised per Dr. Carmelia Roller to lower Losartan to  and add 2.5mg  amlodipine and follow up with PCP in 2-3 weeks.

## 2022-07-21 ENCOUNTER — Ambulatory Visit (INDEPENDENT_AMBULATORY_CARE_PROVIDER_SITE_OTHER): Payer: Medicare HMO

## 2022-07-21 DIAGNOSIS — E538 Deficiency of other specified B group vitamins: Secondary | ICD-10-CM

## 2022-07-21 MED ORDER — CYANOCOBALAMIN 1000 MCG/ML IJ SOLN
1000.0000 ug | Freq: Once | INTRAMUSCULAR | Status: AC
Start: 2022-07-21 — End: 2022-07-21
  Administered 2022-07-21: 1000 ug via INTRAMUSCULAR

## 2022-07-21 NOTE — Progress Notes (Signed)
Pt here for 2/4 weekly B12 injection per Dr. Laury Axon    B12 given IM right deltoid, and pt tolerated injection well. Appt scheduled for next week

## 2022-07-28 ENCOUNTER — Ambulatory Visit (INDEPENDENT_AMBULATORY_CARE_PROVIDER_SITE_OTHER): Payer: Medicare HMO

## 2022-07-28 DIAGNOSIS — E538 Deficiency of other specified B group vitamins: Secondary | ICD-10-CM | POA: Diagnosis not present

## 2022-07-28 MED ORDER — CYANOCOBALAMIN 1000 MCG/ML IJ SOLN
1000.0000 ug | Freq: Once | INTRAMUSCULAR | Status: AC
Start: 2022-07-28 — End: 2022-07-28
  Administered 2022-07-28: 1000 ug via INTRAMUSCULAR

## 2022-07-28 NOTE — Progress Notes (Signed)
Pt here for 3/4 weekly B12 injection per Dr. Laury Axon  B12 given IM left deltoid, and pt tolerated injection well.  Next B12 injection scheduled for next week.

## 2022-08-04 ENCOUNTER — Ambulatory Visit (INDEPENDENT_AMBULATORY_CARE_PROVIDER_SITE_OTHER): Payer: Medicare HMO

## 2022-08-04 DIAGNOSIS — E538 Deficiency of other specified B group vitamins: Secondary | ICD-10-CM

## 2022-08-04 MED ORDER — CYANOCOBALAMIN 1000 MCG/ML IJ SOLN
1000.0000 ug | Freq: Once | INTRAMUSCULAR | Status: AC
Start: 2022-08-04 — End: 2022-08-04
  Administered 2022-08-04: 1000 ug via INTRAMUSCULAR

## 2022-08-04 NOTE — Progress Notes (Signed)
Pt here for 4/4 weekly B12 injection per Dr. Zola Button  B12 given IM right deltoid, and pt tolerated injection well.  Next B12 injection scheduled for 09/08/2022.

## 2022-08-14 ENCOUNTER — Emergency Department (HOSPITAL_BASED_OUTPATIENT_CLINIC_OR_DEPARTMENT_OTHER)
Admission: EM | Admit: 2022-08-14 | Discharge: 2022-08-14 | Disposition: A | Discharge status: Home / Self Care | Payer: Medicare HMO | Attending: Emergency Medicine | Admitting: Emergency Medicine

## 2022-08-14 ENCOUNTER — Encounter (HOSPITAL_BASED_OUTPATIENT_CLINIC_OR_DEPARTMENT_OTHER): Payer: Self-pay | Admitting: Emergency Medicine

## 2022-08-14 ENCOUNTER — Other Ambulatory Visit: Payer: Self-pay

## 2022-08-14 DIAGNOSIS — R300 Dysuria: Secondary | ICD-10-CM | POA: Diagnosis present

## 2022-08-14 DIAGNOSIS — Z79899 Other long term (current) drug therapy: Secondary | ICD-10-CM | POA: Diagnosis not present

## 2022-08-14 DIAGNOSIS — N3001 Acute cystitis with hematuria: Secondary | ICD-10-CM

## 2022-08-14 DIAGNOSIS — I10 Essential (primary) hypertension: Secondary | ICD-10-CM | POA: Diagnosis not present

## 2022-08-14 LAB — CBC
HCT: 40.3 % (ref 36.0–46.0)
Hemoglobin: 13.8 g/dL (ref 12.0–15.0)
MCH: 31.8 pg (ref 26.0–34.0)
MCHC: 34.2 g/dL (ref 30.0–36.0)
MCV: 92.9 fL (ref 80.0–100.0)
Platelets: 255 10*3/uL (ref 150–400)
RBC: 4.34 MIL/uL (ref 3.87–5.11)
RDW: 13.2 % (ref 11.5–15.5)
WBC: 6.1 10*3/uL (ref 4.0–10.5)
nRBC: 0 % (ref 0.0–0.2)

## 2022-08-14 LAB — URINALYSIS, ROUTINE W REFLEX MICROSCOPIC
Bilirubin Urine: NEGATIVE
Glucose, UA: NEGATIVE mg/dL
Ketones, ur: NEGATIVE mg/dL
Nitrite: NEGATIVE
Protein, ur: NEGATIVE mg/dL
RBC / HPF: >50 RBC/hpf (ref 0–5)
Specific Gravity, Urine: 1.01 (ref 1.005–1.030)
WBC, UA: >50 WBC/hpf (ref 0–5)
pH: 6.5 (ref 5.0–8.0)

## 2022-08-14 LAB — BASIC METABOLIC PANEL
Anion gap: 9 (ref 5–15)
BUN: 15 mg/dL (ref 8–23)
CO2: 27 mmol/L (ref 22–32)
Calcium: 9.3 mg/dL (ref 8.9–10.3)
Chloride: 99 mmol/L (ref 98–111)
Creatinine, Ser: 0.8 mg/dL (ref 0.44–1.00)
GFR, Estimated: >60 mL/min (ref >60)
Glucose, Bld: 83 mg/dL (ref 70–99)
Potassium: 4.2 mmol/L (ref 3.5–5.1)
Sodium: 135 mmol/L (ref 135–145)

## 2022-08-14 MED ORDER — CEPHALEXIN 500 MG PO CAPS: 500.0000 mg | ORAL_CAPSULE | Freq: Two times a day (BID) | ORAL | 0 refills | Status: AC

## 2022-08-14 MED ORDER — CEPHALEXIN 250 MG PO CAPS
500.0000 mg | ORAL_CAPSULE | Freq: Once | ORAL | Status: AC
  Administered 2022-08-14: 500 mg via ORAL
  Filled 2022-08-14: qty 2

## 2022-08-14 NOTE — ED Triage Notes (Addendum)
Pt presents to ED POV. Pt c/o diarrhea and pain w/ urination. Diarrhea on wed and today. Burning w/ urination, urinary frequency, and new incontinence since this morning. Hx of UTI w/ same s/s

## 2022-08-14 NOTE — ED Provider Notes (Signed)
[facility] AT [facility] Provider Note   CSN: [id number] Arrival date & time: 08/14/22  1753     History  Chief Complaint  Patient presents with   Dysuria    Junious Silk Worden is a 77 y.o. female with a past medical history of***who presents emergency department with concerns for dysuria onset***.  Also notes associated diarrhea x 4 days.  Has been taking antidiarrheal with her last dose being this morning.  Notes her diarrhea has been watery and nonbloody.  Has associated chronic urinary incontinence.  Also notes back pain that is chronic.  Denies recent fall, injury, trauma, heavy lifting.  Denies new back pain, numbness, tingling,***.    The history is provided by the patient. No language interpreter was used.       Home Medications Prior to Admission medications   Medication Sig Start Date End Date Taking? Authorizing Provider  amLODipine (NORVASC) 2.5 MG tablet Take 1 tablet (2.5 mg total) by mouth daily. 07/14/22   Sharlene Dory, DO  Cholecalciferol (VITAMIN D-3) 125 MCG (5000 UT) TABS Take 1 tablet by mouth daily.    [provider]  citalopram (CELEXA) 20 MG tablet Take 1 tablet (20 mg total) by mouth daily. 06/29/22   Donato Schultz, DO  clindamycin (CLEOCIN T) 1 % external solution Apply topically 2 (two) times daily. 01/18/22   Donato Schultz, DO  COMIRNATY syringe  12/10/21   [provider]  cyanocobalamin 100 MCG tablet Take by mouth.    [provider]  FLUAD QUADRIVALENT 0.5 ML injection  12/10/21   [provider]  losartan (COZAAR) 50 MG tablet Take 1 tablet (50 mg total) by mouth daily. 06/07/22   Donato Schultz, DO  Multiple Vitamin (MULTIVITAMIN) capsule Take 1 capsule by mouth daily.    [provider]  pantoprazole (PROTONIX) 40 MG tablet Take 1 tablet (40 mg total) by mouth daily. 06/29/22   Donato Schultz, DO  tretinoin (RETIN-A) 0.025 % cream Apply  topically at bedtime. 01/18/22   Donato Schultz, DO      Allergies    Bee venom, Codeine, Other, Penicillins, Tetracyclines & related, and Xylocaine dental [lidocaine-epinephrine]    Review of Systems   Review of Systems  Constitutional:  Negative for fever.  Gastrointestinal:  Positive for diarrhea. Negative for abdominal pain, nausea and vomiting.  Genitourinary:  Positive for dysuria and urgency. Negative for flank pain.  All other systems reviewed and are negative.   Physical Exam Updated Vital Signs BP (!) 160/92 (BP Location: Right Arm)   Pulse 70   Temp 97.9 F (36.6 C) (Oral)   Resp 18   SpO2 100%  Physical Exam Vitals and nursing note reviewed.  Constitutional:      General: She is not in acute distress.    Appearance: She is not diaphoretic.  HENT:     Head: Normocephalic and atraumatic.     Mouth/Throat:     Pharynx: No oropharyngeal exudate.  Eyes:     General: No scleral icterus.    Conjunctiva/sclera: Conjunctivae normal.  Cardiovascular:     Rate and Rhythm: Normal rate and regular rhythm.     Pulses: Normal pulses.     Heart sounds: Normal heart sounds.  Pulmonary:     Effort: Pulmonary effort is normal. No respiratory distress.     Breath sounds: Normal breath sounds. No wheezing.  Abdominal:     General: Bowel  sounds are normal.     Palpations: Abdomen is soft. There is no mass.     Tenderness: There is no abdominal tenderness. There is no right CVA tenderness, left CVA tenderness, guarding or rebound.  Musculoskeletal:        General: Normal range of motion.     Cervical back: Normal range of motion and neck supple.     Comments: No spinal tenderness to palpation.  Skin:    General: Skin is warm and dry.  Neurological:     Mental Status: She is alert.     Comments: Strength and sensation intact to BLE.  Psychiatric:        Behavior: Behavior normal.     ED Results / Procedures / Treatments   Labs (all labs ordered are listed, but  only abnormal results are displayed) Labs Reviewed  URINALYSIS, ROUTINE W REFLEX MICROSCOPIC - Abnormal; Notable for the following components:      Result Value   APPearance HAZY (*)    Hgb urine dipstick LARGE (*)    Leukocytes,Ua LARGE (*)    Bacteria, UA FEW (*)    All other components within normal limits  CBC  BASIC METABOLIC PANEL    EKG None  Radiology No results found.  Procedures Procedures    Medications Ordered in ED Medications - No data to display  ED Course/ Medical Decision Making/ A&P Clinical Course as of 08/14/22 1948  Sat Aug 14, 2022  1944 Discussed with patient lab findings. She notes that she is able to take keflex. Discussed discharge treatment plan. Pt agreeable at this time. Pt appears safe for discharge. [SB]    Clinical Course User Index [SB] Pieper Kasik A, PA-C                             Medical Decision Making Amount and/or Complexity of Data Reviewed Labs: ordered.  Risk Prescription drug management.   Pt presents with *** onset ***. {History of it?}. {Brief denies statement, denies sick contacts, similar symptoms}. Vital signs, ***. On exam, pt with ***. No acute cardiovascular, respiratory, abdominal exam findings. Differential diagnosis includes ***.    Co morbidities that complicate the patient evaluation: ***  Additional history obtained:  Additional history obtained from {sabhistory:27144} External records from outside source obtained and reviewed including: ***  Labs:  I ordered, and personally interpreted labs.  The pertinent results include:   {sablabs:28098}  Imaging: I ordered imaging studies including {sabimaging:28099} I independently visualized and interpreted imaging which showed: *** I agree with the radiologist interpretation  Medications:  I ordered medication including keflex for antibiotic treatment I have reviewed the patients home medicines and have made adjustments as  needed   Disposition: {End of MDM here with the likely diagnosis}. After consideration of the diagnostic results and the patients response to treatment, I feel that the patient would benefit from Discharge home. Supportive care measures and strict return precautions discussed with patient at bedside. Pt acknowledges and verbalizes understanding. Pt appears safe for discharge. Follow up as indicated in discharge paperwork.    This chart was dictated using voice recognition software, Dragon. Despite the best efforts of this provider to proofread and correct errors, errors may still occur which can change documentation meaning.  Final Clinical Impression(s) / ED Diagnoses Final diagnoses:  None    Rx / DC Orders ED Discharge Orders     None

## 2022-08-14 NOTE — ED Notes (Signed)
RN reviewed discharge instructions with pt. Pt verbalized understanding and had no further questions. VSS upon discharge.  

## 2022-08-14 NOTE — Discharge Instructions (Signed)
It was a pleasure taking care of you today!   Your labs were unremarkable. Your urine was notable for a UTI. Your urine will be sent for culture. You will be sent a prescription for Keflex, take as prescribed. Ensure that you complete the entire course of antibiotic. Ensure to maintain fluid intake.  You may follow-up with your primary care provider as needed.  Return to the emergency department if you are experiencing increasing or worsening abdominal pain, urinary symptoms, nausea, vomiting, fever, or decreased fluid intake.

## 2022-08-15 LAB — URINE CULTURE

## 2022-08-17 ENCOUNTER — Telehealth: Payer: Self-pay

## 2022-08-17 NOTE — Telephone Encounter (Signed)
Initial Comment Caller states that she believes she has a UTI coming on now. It has been awhile but she is certain this is it. She is unable to hold her urine and she had been having diarrhea which may have prompted the infection. Translation No Nurse Assessment Nurse: Shanna Cisco, RN, Gavin Pound Date/Time Lamount Cohen Time): 08/14/2022 5:07:43 PM Confirm and document reason for call. If symptomatic, describe symptoms. ---Caller states that she believes she has a UTI coming on now. Pt states it has been a while she had one but she is certain this is it. Patient states she's unable to hold her urine and it burns when urinates. Pt states she had diarrhea which she think it could have prompted for the infection. Does the patient have any new or worsening symptoms? ---Yes Will a triage be completed? ---Yes Related visit to physician within the last 2 weeks? ---No Does the PT have any chronic conditions? (i.e. diabetes, asthma, this includes High risk factors for pregnancy, etc.) ---Yes List chronic conditions. ---Low Vitamin B12 - Shots HTN Anxiety Is this a behavioral health or substance abuse call? ---No Guidelines Guideline Title Affirmed Question Affirmed Notes Nurse Date/Time Lamount Cohen Time) Urination Pain - Female Side (flank) or lower back pain present Shanna Cisco, RN, Gavin Pound 08/14/2022 5:11:46 PM PLEASE NOTE: All timestamps contained within this report are represented as [place] Standard Time. CONFIDENTIALTY NOTICE: This fax transmission is intended only for the addressee. It contains information that is legally privileged, confidential or otherwise protected from use or disclosure. If you are not the intended recipient, you are strictly prohibited from reviewing, disclosing, copying using or disseminating any of this information or taking any action in reliance on or regarding this information. If you have received this fax in error, please notify us immediately by  telephone so that we can arrange for its return to [place]. Phone: [phone], Toll-Free: [phone], Fax: [phone] Page: 2 of 2 Call Id: [id number] Disp. Time Lamount Cohen Time) Disposition Final User 08/14/2022 5:21:40 PM See HCP within 4 Hours (or PCP triage) Yes Shanna Cisco, RN, Deborah Final Disposition 08/14/2022 5:21:40 PM See HCP within 4 Hours (or PCP triage) Yes Shanna Cisco, RN, Jetty Duhamel Disagree/Comply Comply Caller Understands Yes PreDisposition Call Doctor Care Advice Given Per Guideline SEE HCP (OR PCP TRIAGE) WITHIN 4 HOURS: CALL BACK IF: * You become worse CARE ADVICE given per Urination Pain - Female (Adult) guideline. * IF OFFICE WILL BE CLOSED AND NO PCP (PRIMARY CARE PROVIDER) SECONDLEVEL TRIAGE: You need to be seen within the next 3 or 4 hours. A nearby Urgent Care Center [facility]) is often a good source of care. Another choice is to go to the ED. Go sooner if you become worse. PAIN MEDICINES: * For pain relief, you can take either acetaminophen, ibuprofen, or naproxen. Referrals GO TO FACILITY UNDECIDED

## 2022-08-17 NOTE — Telephone Encounter (Signed)
Pt seen at ED 

## 2022-08-18 ENCOUNTER — Telehealth: Payer: Self-pay | Admitting: *Deleted

## 2022-08-18 NOTE — Telephone Encounter (Signed)
Transition Care Management Follow-up Telephone Call Date of discharge and from where: 08/14/2022 [facility]  How have you been since you were released from the hospital? Much better  Any questions or concerns? No  Items Reviewed: Did the pt receive and understand the discharge instructions provided? Yes  Medications obtained and verified? No  Other? No  Any new allergies since your discharge? No  Dietary orders reviewed? No Do you have support at home? Yes    Follow up appointments reviewed:  Marland Kitchen Are transportation arrangements needed? Yes  If their condition worsens, is the pt aware to call PCP or go to the Emergency Dept.? Yes Was the patient provided with contact information for the PCP's office or ED? Yes Was to pt encouraged to call back with questions or concerns? Yes

## 2022-09-02 ENCOUNTER — Other Ambulatory Visit: Payer: Self-pay | Admitting: Family Medicine

## 2022-09-02 DIAGNOSIS — I10 Essential (primary) hypertension: Secondary | ICD-10-CM

## 2022-09-03 ENCOUNTER — Other Ambulatory Visit: Payer: Self-pay | Admitting: Family Medicine

## 2022-09-03 DIAGNOSIS — M5117 Intervertebral disc disorders with radiculopathy, lumbosacral region: Secondary | ICD-10-CM | POA: Diagnosis not present

## 2022-09-03 DIAGNOSIS — M9902 Segmental and somatic dysfunction of thoracic region: Secondary | ICD-10-CM | POA: Diagnosis not present

## 2022-09-03 DIAGNOSIS — M9901 Segmental and somatic dysfunction of cervical region: Secondary | ICD-10-CM | POA: Diagnosis not present

## 2022-09-03 DIAGNOSIS — M9903 Segmental and somatic dysfunction of lumbar region: Secondary | ICD-10-CM | POA: Diagnosis not present

## 2022-09-03 DIAGNOSIS — M9905 Segmental and somatic dysfunction of pelvic region: Secondary | ICD-10-CM | POA: Diagnosis not present

## 2022-09-03 DIAGNOSIS — F4323 Adjustment disorder with mixed anxiety and depressed mood: Secondary | ICD-10-CM

## 2022-09-03 DIAGNOSIS — M9904 Segmental and somatic dysfunction of sacral region: Secondary | ICD-10-CM | POA: Diagnosis not present

## 2022-09-03 DIAGNOSIS — M5033 Other cervical disc degeneration, cervicothoracic region: Secondary | ICD-10-CM | POA: Diagnosis not present

## 2022-09-08 ENCOUNTER — Ambulatory Visit (INDEPENDENT_AMBULATORY_CARE_PROVIDER_SITE_OTHER): Payer: Medicare HMO | Admitting: *Deleted

## 2022-09-08 DIAGNOSIS — E538 Deficiency of other specified B group vitamins: Secondary | ICD-10-CM

## 2022-09-08 MED ORDER — CYANOCOBALAMIN 1000 MCG/ML IJ SOLN
1000.0000 ug | Freq: Once | INTRAMUSCULAR | Status: AC
Start: 2022-09-08 — End: 2022-09-08
  Administered 2022-09-08: 1000 ug via INTRAMUSCULAR

## 2022-09-08 NOTE — Progress Notes (Signed)
Patient here for monthly b12 injection per physicians order.  Injection given in left deltoid and patient tolerated well.  

## 2022-09-19 DIAGNOSIS — M5033 Other cervical disc degeneration, cervicothoracic region: Secondary | ICD-10-CM | POA: Diagnosis not present

## 2022-09-19 DIAGNOSIS — M9904 Segmental and somatic dysfunction of sacral region: Secondary | ICD-10-CM | POA: Diagnosis not present

## 2022-09-19 DIAGNOSIS — M9901 Segmental and somatic dysfunction of cervical region: Secondary | ICD-10-CM | POA: Diagnosis not present

## 2022-09-19 DIAGNOSIS — M9903 Segmental and somatic dysfunction of lumbar region: Secondary | ICD-10-CM | POA: Diagnosis not present

## 2022-09-19 DIAGNOSIS — M9902 Segmental and somatic dysfunction of thoracic region: Secondary | ICD-10-CM | POA: Diagnosis not present

## 2022-09-19 DIAGNOSIS — M5117 Intervertebral disc disorders with radiculopathy, lumbosacral region: Secondary | ICD-10-CM | POA: Diagnosis not present

## 2022-09-19 DIAGNOSIS — M9905 Segmental and somatic dysfunction of pelvic region: Secondary | ICD-10-CM | POA: Diagnosis not present

## 2022-09-26 DIAGNOSIS — M9905 Segmental and somatic dysfunction of pelvic region: Secondary | ICD-10-CM | POA: Diagnosis not present

## 2022-09-26 DIAGNOSIS — M5033 Other cervical disc degeneration, cervicothoracic region: Secondary | ICD-10-CM | POA: Diagnosis not present

## 2022-09-26 DIAGNOSIS — M9903 Segmental and somatic dysfunction of lumbar region: Secondary | ICD-10-CM | POA: Diagnosis not present

## 2022-09-26 DIAGNOSIS — M5117 Intervertebral disc disorders with radiculopathy, lumbosacral region: Secondary | ICD-10-CM | POA: Diagnosis not present

## 2022-09-26 DIAGNOSIS — M9901 Segmental and somatic dysfunction of cervical region: Secondary | ICD-10-CM | POA: Diagnosis not present

## 2022-09-26 DIAGNOSIS — M9902 Segmental and somatic dysfunction of thoracic region: Secondary | ICD-10-CM | POA: Diagnosis not present

## 2022-09-26 DIAGNOSIS — M9904 Segmental and somatic dysfunction of sacral region: Secondary | ICD-10-CM | POA: Diagnosis not present

## 2022-09-29 ENCOUNTER — Ambulatory Visit: Payer: Medicare HMO | Admitting: Gastroenterology

## 2022-09-29 ENCOUNTER — Encounter: Payer: Self-pay | Admitting: Gastroenterology

## 2022-09-29 ENCOUNTER — Other Ambulatory Visit (INDEPENDENT_AMBULATORY_CARE_PROVIDER_SITE_OTHER): Payer: Medicare HMO

## 2022-09-29 VITALS — BP 120/78 | HR 78 | Ht 65.0 in | Wt 140.0 lb

## 2022-09-29 DIAGNOSIS — K449 Diaphragmatic hernia without obstruction or gangrene: Secondary | ICD-10-CM

## 2022-09-29 DIAGNOSIS — Z86010 Personal history of colonic polyps: Secondary | ICD-10-CM

## 2022-09-29 DIAGNOSIS — K317 Polyp of stomach and duodenum: Secondary | ICD-10-CM | POA: Diagnosis not present

## 2022-09-29 DIAGNOSIS — K219 Gastro-esophageal reflux disease without esophagitis: Secondary | ICD-10-CM | POA: Diagnosis not present

## 2022-09-29 DIAGNOSIS — K580 Irritable bowel syndrome with diarrhea: Secondary | ICD-10-CM | POA: Diagnosis not present

## 2022-09-29 LAB — CBC WITH DIFFERENTIAL/PLATELET
Basophils Absolute: 0.1 10*3/uL (ref 0.0–0.1)
Basophils Relative: 0.9 % (ref 0.0–3.0)
Eosinophils Absolute: 0.1 10*3/uL (ref 0.0–0.7)
Eosinophils Relative: 1.3 % (ref 0.0–5.0)
HCT: 40.4 % (ref 36.0–46.0)
Hemoglobin: 13.3 g/dL (ref 12.0–15.0)
Lymphocytes Relative: 17.2 % (ref 12.0–46.0)
Lymphs Abs: 1.2 10*3/uL (ref 0.7–4.0)
MCHC: 32.9 g/dL (ref 30.0–36.0)
MCV: 95 fl (ref 78.0–100.0)
Monocytes Absolute: 0.8 10*3/uL (ref 0.1–1.0)
Monocytes Relative: 11.3 % (ref 3.0–12.0)
Neutro Abs: 4.7 10*3/uL (ref 1.4–7.7)
Neutrophils Relative %: 69.3 % (ref 43.0–77.0)
Platelets: 266 10*3/uL (ref 150.0–400.0)
RBC: 4.26 Mil/uL (ref 3.87–5.11)
RDW: 13 % (ref 11.5–15.5)
WBC: 6.8 10*3/uL (ref 4.0–10.5)

## 2022-09-29 LAB — AMYLASE: Amylase: 297 U/L — ABNORMAL HIGH (ref 27–131)

## 2022-09-29 LAB — COMPREHENSIVE METABOLIC PANEL
ALT: 17 U/L (ref 0–35)
AST: 22 U/L (ref 0–37)
Albumin: 4.2 g/dL (ref 3.5–5.2)
Alkaline Phosphatase: 58 U/L (ref 39–117)
BUN: 19 mg/dL (ref 6–23)
CO2: 29 mEq/L (ref 19–32)
Calcium: 9.9 mg/dL (ref 8.4–10.5)
Chloride: 101 mEq/L (ref 96–112)
Creatinine, Ser: 0.79 mg/dL (ref 0.40–1.20)
GFR: 72.59 mL/min (ref >60.00)
Glucose, Bld: 94 mg/dL (ref 70–99)
Potassium: 5.4 mEq/L — ABNORMAL HIGH (ref 3.5–5.1)
Sodium: 138 mEq/L (ref 135–145)
Total Bilirubin: 0.8 mg/dL (ref 0.2–1.2)
Total Protein: 7.2 g/dL (ref 6.0–8.3)

## 2022-09-29 LAB — C-REACTIVE PROTEIN: CRP: <1 mg/dL (ref 0.5–20.0)

## 2022-09-29 LAB — LIPASE: Lipase: 30 U/L (ref 11.0–59.0)

## 2022-09-29 MED ORDER — CLENPIQ 10-3.5-12 MG-GM -GM/175ML PO SOLN: 1.0000 | ORAL | 0 refills | Status: DC

## 2022-09-29 NOTE — Progress Notes (Signed)
Chief Complaint:   Referring Provider:  Zola Button, Grayling Congress, *      ASSESSMENT AND PLAN;   #1. Gastric polyps (Bx hyperplastic) with intestinal metaplasia and +ve H. Pylori 07/23/2019.  Treated with triple drug therapy. CT abdo/pelvis 03/2018 with gastric wall thickening. Neg HP breath test 06/2022  #2. GERD with small transient HH. Now with occ dysphagia  #3. 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 Nl Lipase. Neg CT, [place] 03/2018 for pancreatic abn. Neg salivary amylase. No abdo pain. Off HCTZ.  Neg EUS 07/23/2019 (Dr Boyd Kerbs - report awaited)  #4. IBS-D  #5. H/O polyps 2017  #6.  B12 deficiency. Nl CBC   Plan: -EGD with dil/colon with Bx -Reduce Protonix 40 mg every other day. -CBC, CMP, amylase, lipase, CRP, celiac  -If still with problems repeat CT Abdo/pelvis -Cut down on wine.   HPI:    Kristen Schultz is a 77 y.o. female   For FU Doing well  Occ solid food dysphagia-mostly in mid chest.  No significant heartburn as long as she takes Protonix.  No odynophagia.  No significant regurgitation.  Would like to get endoscopy performed  Diarrhea is much better but still would have episodes of diarrhea especially after eating and when she is under stress.  No nocturnal symptoms.  She is due for repeat colonoscopy.  No weight loss.  She denies having any abdominal pain currently.  Occ dyspgagia  Reflux  B12 was low- on shots now. Nl CBC with hemoglobin 13.8, MCV 92 08/14/2022  Has been eating well. No nausea, vomiting. No significant diarrhea or constipation.  There is no melena or hematochezia. No unintentional weight loss.  No family history of gastric malignancy.   Wt Readings from Last 3 Encounters:  09/29/22 140 lb (63.5 kg)  06/29/22 145 lb 6.4 oz (66 kg)  01/18/22 142 lb (64.4 kg)     Past GI procedures:   -Colonoscopy 02/10/2016-4 mm polyp status post polypectomy, internal hemorrhoids, left colonic diverticulosis. Bx- TA.  Rpt in 5 yrs. -EGD 07/23/2019 by Dr. Boyd Kerbs with EUS.  Esophagus normal.  6 mm sessile polyp in the gastric body s/p polypectomy, 10 mm sessile polyp at GE junction s/p polypectomy followed by placement of 4 hemoclips.  Abnormal mucosa in the gastric body which appeared atrophic.  Multiple random biopsies were performed.  Normal duodenum.  bx-hyperplastic polyps with intestinal metaplasia.  Gastric mapping biopsies showed chronic gastritis with intestinal metaplasia.  Suspicious for H. Pylori. 08/18/2018 Gastric polyps, small HH, atrophic gastritis. Bx- HGD polyps (mainly antral but sent in same bottle), gastric metaplasia, neg SB Bx for celiac. -CT abdo/pel 04/10/2018   -Sigmoid diverticulosis with muscular hypertrophy.  -Small hiatal hernia  -Mild gastric wall thickening.  -Liver scar due to previous cyst removal.  No cirrhosis on review.  -Aortic atherosclerosis.  -DJD   SH- avid hiker, wine 2 glases/day  Past Medical History:  Diagnosis Date   Anxiety    Cellulitis of chin 11/17/2020   Colon polyps    COVID-19 11/17/2020   Gastric ulcer    per patient; in the 1970s   GERD (gastroesophageal reflux disease)    H. pylori infection    Heart murmur    Hyperlipidemia    no treatment per pt   Hypertension    Liver cyst    Ovarian cyst 1984   Pneumonia     Past Surgical History:  Procedure Laterality Date   COLONOSCOPY  02/10/2016  DENTAL SURGERY  2000   dental implant   DENTAL SURGERY  2021   2 molars taken out    ESOPHAGOGASTRODUODENOSCOPY  11/06/2018   [facility]    ESOPHAGOGASTRODUODENOSCOPY  07/23/2019   [facility]    EUS  07/23/2019   [facility]    HEMORRHOID SURGERY     LAPAROSCOPIC LIVER CYST REMOVAL     OVARIAN CYST SURGERY      Family History  Problem Relation Age of Onset   Heart disease Father    Heart failure Sister    Heart disease Sister    Dementia Sister    Heart disease Brother 53   Heart disease Brother    Alzheimer's  disease Brother    Colon cancer Neg Hx    Esophageal cancer Neg Hx    Pancreatic cancer Neg Hx    Stomach cancer Neg Hx    Rectal cancer Neg Hx     Social History   Tobacco Use   Smoking status: Former    Packs/day: 0.30    Years: 15.00    Additional pack years: 0.00    Total pack years: 4.50    Types: Cigarettes    Quit date: 11/17/1985    Years since quitting: 36.8   Smokeless tobacco: Never  Vaping Use   Vaping Use: Never used  Substance Use Topics   Alcohol use: Yes    Alcohol/week: 14.0 standard drinks of alcohol    Types: 14 Glasses of wine per week    Comment: 2 glasses per night   Drug use: No    Current Outpatient Medications  Medication Sig Dispense Refill   amLODipine (NORVASC) 2.5 MG tablet Take 1 tablet (2.5 mg total) by mouth daily. 30 tablet 3   Cholecalciferol (VITAMIN D-3) 125 MCG (5000 UT) TABS Take 1 tablet by mouth daily.     citalopram (CELEXA) 20 MG tablet Take 1 tablet (20 mg total) by mouth daily. 30 tablet 3   clindamycin (CLEOCIN T) 1 % external solution Apply topically 2 (two) times daily. 60 mL 3   cyanocobalamin 100 MCG tablet Take by mouth.     FLUAD QUADRIVALENT 0.5 ML injection      losartan (COZAAR) 50 MG tablet Take 1 tablet (50 mg total) by mouth daily. 90 tablet 1   Multiple Vitamin (MULTIVITAMIN) capsule Take 1 capsule by mouth daily.     pantoprazole (PROTONIX) 40 MG tablet Take 1 tablet (40 mg total) by mouth daily. 30 tablet 3   COMIRNATY syringe  (Patient not taking: Reported on 09/29/2022)     tretinoin (RETIN-A) 0.025 % cream Apply topically at bedtime. (Patient not taking: Reported on 09/29/2022) 45 g 0   No current facility-administered medications for this visit.    Allergies  Allergen Reactions   Bee Venom Anaphylaxis    Bee stings   Codeine     Extreme Vomiting   Other Other (See Comments)    CDN - reaction unknown   Penicillins     Redness, swelling and itching Can take Keflex   Tetracyclines & Related     Yeast  Infection and Stomach problems   Xylocaine Dental [Lidocaine-Epinephrine]     Review of Systems:  neg     Physical Exam:    BP 120/78   Pulse 78   Ht 5\' 5"  (1.651 m)   Wt 140 lb (63.5 kg)   BMI 23.30 kg/m  Filed Weights   09/29/22 0909  Weight: 140  lb (63.5 kg)   Gen: awake, alert, NAD HEENT: anicteric, no pallor CV: RRR, no mrg Pulm: CTA b/l Abd: soft, NT/ND, +BS throughout Ext: no c/c/e Neuro: nonfocal   CBC:    Latest Ref Rng & Units 08/14/2022    6:23 PM 06/29/2022   11:59 AM 06/09/2021    4:53 PM  CBC  WBC 4.0 - 10.5 K/uL 6.1  6.8  6.7   Hemoglobin 12.0 - 15.0 g/dL 16.1  09.6  04.5   Hematocrit 36.0 - 46.0 % 40.3  42.3  43.1   Platelets 150 - 400 K/uL 255  288.0  268.0     CMP:    Latest Ref Rng & Units 08/14/2022    6:23 PM 06/29/2022   11:59 AM 06/09/2021    4:53 PM  CMP  Glucose 70 - 99 mg/dL 83  80  61   BUN 8 - 23 mg/dL 15  13  23    Creatinine 0.44 - 1.00 mg/dL 4.09  8.11  9.14   Sodium 135 - 145 mmol/L 135  136  137   Potassium 3.5 - 5.1 mmol/L 4.2  4.4  5.0   Chloride 98 - 111 mmol/L 99  99  101   CO2 22 - 32 mmol/L 27  26  30    Calcium 8.9 - 10.3 mg/dL 9.3  9.4  9.4   Total Protein 6.0 - 8.3 g/dL  6.8  6.9   Total Bilirubin 0.2 - 1.2 mg/dL  0.6  0.4   Alkaline Phos 39 - 117 U/L  59  58   AST 0 - 37 U/L  26  24   ALT 0 - 35 U/L  18  15    Amylase    Component Value Date/Time   AMYLASE 362 (H) 06/09/2021 1653   Lipase     Component Value Date/Time   LIPASE 29 06/29/2018 1320         Edman Circle, MD 09/29/2022, 9:35 AM  Cc: Zola Button, Grayling Congress, *

## 2022-09-29 NOTE — Patient Instructions (Addendum)
_______________________________________________________  If your blood pressure at your visit was 140/90 or greater, please contact your primary care physician to follow up on this.  _______________________________________________________  If you are age 77 or older, your body mass index should be between 23-30. Your Body mass index is 23.3 kg/m. If this is out of the aforementioned range listed, please consider follow up with your Primary Care Provider.  If you are age 73 or younger, your body mass index should be between 19-25. Your Body mass index is 23.3 kg/m. If this is out of the aformentioned range listed, please consider follow up with your Primary Care Provider.   ________________________________________________________  The [place] [facility] providers would like to encourage you to use [facility] to communicate with providers for non-urgent requests or questions.  Due to long hold times on the telephone, sending your provider a message by [facility] may be a faster and more efficient way to get a response.  Please allow 48 business hours for a response.  Please remember that this is for non-urgent requests.  _______________________________________________________   Your provider has requested that you go to the basement level for lab work before leaving today. Press "B" on the elevator. The lab is located at the first door on the left as you exit the elevator.  We have sent the following medications to your pharmacy for you to pick up at your convenience: Clenpiq  You have been scheduled for an endoscopy and colonoscopy. Please follow the written instructions given to you at your visit today.  Please pick up your prep supplies at the pharmacy within the next 1-3 days.  If you use inhalers (even only as needed), please bring them with you on the day of your procedure.  DO NOT TAKE 7 DAYS PRIOR TO TEST- Trulicity (dulaglutide) Ozempic, Wegovy (semaglutide) Mounjaro (tirzepatide) Bydureon  Bcise (exanatide extended release)  DO NOT TAKE 1 DAY PRIOR TO YOUR TEST Rybelsus (semaglutide) Adlyxin (lixisenatide) Victoza (liraglutide) Byetta (exanatide) ___________________________________________________________________________  Thank you,  Dr. Lynann Bologna

## 2022-10-01 DIAGNOSIS — M9902 Segmental and somatic dysfunction of thoracic region: Secondary | ICD-10-CM | POA: Diagnosis not present

## 2022-10-01 DIAGNOSIS — M9903 Segmental and somatic dysfunction of lumbar region: Secondary | ICD-10-CM | POA: Diagnosis not present

## 2022-10-01 DIAGNOSIS — M5117 Intervertebral disc disorders with radiculopathy, lumbosacral region: Secondary | ICD-10-CM | POA: Diagnosis not present

## 2022-10-01 DIAGNOSIS — M9901 Segmental and somatic dysfunction of cervical region: Secondary | ICD-10-CM | POA: Diagnosis not present

## 2022-10-01 DIAGNOSIS — M9905 Segmental and somatic dysfunction of pelvic region: Secondary | ICD-10-CM | POA: Diagnosis not present

## 2022-10-01 DIAGNOSIS — M9904 Segmental and somatic dysfunction of sacral region: Secondary | ICD-10-CM | POA: Diagnosis not present

## 2022-10-01 DIAGNOSIS — M5033 Other cervical disc degeneration, cervicothoracic region: Secondary | ICD-10-CM | POA: Diagnosis not present

## 2022-10-01 LAB — CELIAC PANEL 10
Antigliadin Abs, IgA: 4 units (ref 0–19)
Gliadin IgG: 2 units (ref 0–19)
IgA/Immunoglobulin A, Serum: 412 mg/dL (ref 64–422)

## 2022-10-03 ENCOUNTER — Other Ambulatory Visit: Payer: Self-pay | Admitting: Family Medicine

## 2022-10-03 DIAGNOSIS — R1013 Epigastric pain: Secondary | ICD-10-CM

## 2022-10-03 LAB — CELIAC PANEL 10
Endomysial IgA: NEGATIVE
Tissue Transglut Ab: 2 U/mL (ref 0–5)

## 2022-10-06 DIAGNOSIS — M9905 Segmental and somatic dysfunction of pelvic region: Secondary | ICD-10-CM | POA: Diagnosis not present

## 2022-10-06 DIAGNOSIS — M5033 Other cervical disc degeneration, cervicothoracic region: Secondary | ICD-10-CM | POA: Diagnosis not present

## 2022-10-06 DIAGNOSIS — M9902 Segmental and somatic dysfunction of thoracic region: Secondary | ICD-10-CM | POA: Diagnosis not present

## 2022-10-06 DIAGNOSIS — M5117 Intervertebral disc disorders with radiculopathy, lumbosacral region: Secondary | ICD-10-CM | POA: Diagnosis not present

## 2022-10-06 DIAGNOSIS — M9904 Segmental and somatic dysfunction of sacral region: Secondary | ICD-10-CM | POA: Diagnosis not present

## 2022-10-06 DIAGNOSIS — M9903 Segmental and somatic dysfunction of lumbar region: Secondary | ICD-10-CM | POA: Diagnosis not present

## 2022-10-06 DIAGNOSIS — M9901 Segmental and somatic dysfunction of cervical region: Secondary | ICD-10-CM | POA: Diagnosis not present

## 2022-10-13 ENCOUNTER — Ambulatory Visit (INDEPENDENT_AMBULATORY_CARE_PROVIDER_SITE_OTHER): Payer: Medicare HMO

## 2022-10-13 DIAGNOSIS — M9903 Segmental and somatic dysfunction of lumbar region: Secondary | ICD-10-CM | POA: Diagnosis not present

## 2022-10-13 DIAGNOSIS — E538 Deficiency of other specified B group vitamins: Secondary | ICD-10-CM

## 2022-10-13 DIAGNOSIS — M9901 Segmental and somatic dysfunction of cervical region: Secondary | ICD-10-CM | POA: Diagnosis not present

## 2022-10-13 DIAGNOSIS — M9905 Segmental and somatic dysfunction of pelvic region: Secondary | ICD-10-CM | POA: Diagnosis not present

## 2022-10-13 DIAGNOSIS — M5117 Intervertebral disc disorders with radiculopathy, lumbosacral region: Secondary | ICD-10-CM | POA: Diagnosis not present

## 2022-10-13 DIAGNOSIS — M9902 Segmental and somatic dysfunction of thoracic region: Secondary | ICD-10-CM | POA: Diagnosis not present

## 2022-10-13 DIAGNOSIS — M5033 Other cervical disc degeneration, cervicothoracic region: Secondary | ICD-10-CM | POA: Diagnosis not present

## 2022-10-13 DIAGNOSIS — M9904 Segmental and somatic dysfunction of sacral region: Secondary | ICD-10-CM | POA: Diagnosis not present

## 2022-10-13 MED ORDER — CYANOCOBALAMIN 1000 MCG/ML IJ SOLN
1000.0000 ug | Freq: Once | INTRAMUSCULAR | Status: AC
Start: 2022-10-13 — End: 2022-10-13
  Administered 2022-10-13: 1000 ug via INTRAMUSCULAR

## 2022-10-13 NOTE — Progress Notes (Signed)
Pt here for monthly B12 injection per Dr.Lowne-Chase  B12 given IM, and pt tolerated injection well.  Next B12 injection scheduled for next month

## 2022-10-14 ENCOUNTER — Ambulatory Visit: Payer: Medicare HMO

## 2022-10-20 ENCOUNTER — Telehealth: Payer: Self-pay | Admitting: Gastroenterology

## 2022-10-20 ENCOUNTER — Encounter (INDEPENDENT_AMBULATORY_CARE_PROVIDER_SITE_OTHER): Payer: Self-pay

## 2022-10-20 NOTE — Telephone Encounter (Signed)
PT is calling to get a pre cert for her ECL on 8/28 with Dr. Chales Abrahams. Please advise.

## 2022-10-21 ENCOUNTER — Encounter: Payer: Self-pay | Admitting: Gastroenterology

## 2022-10-22 ENCOUNTER — Other Ambulatory Visit: Payer: Self-pay | Admitting: Family Medicine

## 2022-10-22 DIAGNOSIS — F4323 Adjustment disorder with mixed anxiety and depressed mood: Secondary | ICD-10-CM

## 2022-10-27 DIAGNOSIS — M9901 Segmental and somatic dysfunction of cervical region: Secondary | ICD-10-CM | POA: Diagnosis not present

## 2022-10-27 DIAGNOSIS — M9903 Segmental and somatic dysfunction of lumbar region: Secondary | ICD-10-CM | POA: Diagnosis not present

## 2022-10-27 DIAGNOSIS — M9904 Segmental and somatic dysfunction of sacral region: Secondary | ICD-10-CM | POA: Diagnosis not present

## 2022-10-27 DIAGNOSIS — M9905 Segmental and somatic dysfunction of pelvic region: Secondary | ICD-10-CM | POA: Diagnosis not present

## 2022-10-27 DIAGNOSIS — M9902 Segmental and somatic dysfunction of thoracic region: Secondary | ICD-10-CM | POA: Diagnosis not present

## 2022-10-27 DIAGNOSIS — M5033 Other cervical disc degeneration, cervicothoracic region: Secondary | ICD-10-CM | POA: Diagnosis not present

## 2022-10-27 DIAGNOSIS — M5117 Intervertebral disc disorders with radiculopathy, lumbosacral region: Secondary | ICD-10-CM | POA: Diagnosis not present

## 2022-10-30 ENCOUNTER — Other Ambulatory Visit: Payer: Self-pay | Admitting: Family Medicine

## 2022-10-30 DIAGNOSIS — I10 Essential (primary) hypertension: Secondary | ICD-10-CM

## 2022-11-01 DIAGNOSIS — M9905 Segmental and somatic dysfunction of pelvic region: Secondary | ICD-10-CM | POA: Diagnosis not present

## 2022-11-01 DIAGNOSIS — M9902 Segmental and somatic dysfunction of thoracic region: Secondary | ICD-10-CM | POA: Diagnosis not present

## 2022-11-01 DIAGNOSIS — M9901 Segmental and somatic dysfunction of cervical region: Secondary | ICD-10-CM | POA: Diagnosis not present

## 2022-11-01 DIAGNOSIS — M5117 Intervertebral disc disorders with radiculopathy, lumbosacral region: Secondary | ICD-10-CM | POA: Diagnosis not present

## 2022-11-01 DIAGNOSIS — M9904 Segmental and somatic dysfunction of sacral region: Secondary | ICD-10-CM | POA: Diagnosis not present

## 2022-11-01 DIAGNOSIS — M5033 Other cervical disc degeneration, cervicothoracic region: Secondary | ICD-10-CM | POA: Diagnosis not present

## 2022-11-01 DIAGNOSIS — M9903 Segmental and somatic dysfunction of lumbar region: Secondary | ICD-10-CM | POA: Diagnosis not present

## 2022-11-10 DIAGNOSIS — M9903 Segmental and somatic dysfunction of lumbar region: Secondary | ICD-10-CM | POA: Diagnosis not present

## 2022-11-10 DIAGNOSIS — M9905 Segmental and somatic dysfunction of pelvic region: Secondary | ICD-10-CM | POA: Diagnosis not present

## 2022-11-10 DIAGNOSIS — M5033 Other cervical disc degeneration, cervicothoracic region: Secondary | ICD-10-CM | POA: Diagnosis not present

## 2022-11-10 DIAGNOSIS — M9901 Segmental and somatic dysfunction of cervical region: Secondary | ICD-10-CM | POA: Diagnosis not present

## 2022-11-10 DIAGNOSIS — M9904 Segmental and somatic dysfunction of sacral region: Secondary | ICD-10-CM | POA: Diagnosis not present

## 2022-11-10 DIAGNOSIS — M5117 Intervertebral disc disorders with radiculopathy, lumbosacral region: Secondary | ICD-10-CM | POA: Diagnosis not present

## 2022-11-10 DIAGNOSIS — M9902 Segmental and somatic dysfunction of thoracic region: Secondary | ICD-10-CM | POA: Diagnosis not present

## 2022-11-11 NOTE — Progress Notes (Signed)
Kristen Schultz is a 77 y.o. female presents to the office today for 3rd Monthly B12 injection, per physician's orders. Original order: 06/29/2022: "B12 is low----- she may benefit from b12 injections weekly x4 then monthly." Cyanocobalamin 1000 mg/ml IM was administered L deltoid today. Patient tolerated injection. Patient due for follow up labs/provider appt: No.  Patient next injection due: 1 month for 4th monthly B12 injection, appt made No- apt is pending already

## 2022-11-12 ENCOUNTER — Ambulatory Visit (INDEPENDENT_AMBULATORY_CARE_PROVIDER_SITE_OTHER): Payer: Medicare HMO

## 2022-11-12 DIAGNOSIS — E538 Deficiency of other specified B group vitamins: Secondary | ICD-10-CM

## 2022-11-12 MED ORDER — CYANOCOBALAMIN 1000 MCG/ML IJ SOLN
1000.0000 ug | Freq: Once | INTRAMUSCULAR | Status: AC
Start: 2022-11-12 — End: 2022-11-12
  Administered 2022-11-12: 1000 ug via INTRAMUSCULAR

## 2022-11-17 ENCOUNTER — Encounter: Payer: Self-pay | Admitting: Gastroenterology

## 2022-11-17 ENCOUNTER — Ambulatory Visit: Payer: Medicare HMO | Admitting: Gastroenterology

## 2022-11-17 VITALS — BP 137/59 | HR 72 | Temp 96.8°F | Resp 11 | Ht 65.0 in | Wt 140.0 lb

## 2022-11-17 DIAGNOSIS — K319 Disease of stomach and duodenum, unspecified: Secondary | ICD-10-CM | POA: Diagnosis not present

## 2022-11-17 DIAGNOSIS — Z09 Encounter for follow-up examination after completed treatment for conditions other than malignant neoplasm: Secondary | ICD-10-CM | POA: Diagnosis not present

## 2022-11-17 DIAGNOSIS — K317 Polyp of stomach and duodenum: Secondary | ICD-10-CM | POA: Diagnosis not present

## 2022-11-17 DIAGNOSIS — Z86010 Personal history of colonic polyps: Secondary | ICD-10-CM

## 2022-11-17 DIAGNOSIS — K31A0 Gastric intestinal metaplasia, unspecified: Secondary | ICD-10-CM

## 2022-11-17 DIAGNOSIS — I10 Essential (primary) hypertension: Secondary | ICD-10-CM | POA: Diagnosis not present

## 2022-11-17 DIAGNOSIS — K2940 Chronic atrophic gastritis without bleeding: Secondary | ICD-10-CM | POA: Diagnosis not present

## 2022-11-17 DIAGNOSIS — K31A11 Gastric intestinal metaplasia without dysplasia, involving the antrum: Secondary | ICD-10-CM | POA: Diagnosis not present

## 2022-11-17 MED ORDER — SODIUM CHLORIDE 0.9 % IV SOLN
500.0000 mL | Freq: Once | INTRAVENOUS | Status: DC
Start: 2022-11-17 — End: 2022-11-17

## 2022-11-17 NOTE — Progress Notes (Signed)
Chief Complaint:   Referring Provider:  Zola Button, Grayling Congress, *      ASSESSMENT AND PLAN;   #1. Gastric polyps (Bx hyperplastic) with intestinal metaplasia and +ve H. Pylori 07/23/2019.  Treated with triple drug therapy. CT abdo/pelvis 03/2018 with gastric wall thickening. Neg HP breath test 06/2022  #2. GERD with small transient HH. Now with occ dysphagia  #3. 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 Nl Lipase. Neg CT, [place] 03/2018 for pancreatic abn. Neg salivary amylase. No abdo pain. Off HCTZ.  Neg EUS 07/23/2019 (Dr Boyd Kerbs - report awaited)  #4. IBS-D  #5. H/O polyps 2017  #6.  B12 deficiency. Nl CBC   Plan: -EGD with dil/colon with Bx -Reduce Protonix 40 mg every other day. -CBC, CMP, amylase, lipase, CRP, celiac - NORMAL -If still with problems repeat CT Abdo/pelvis -Cut down on wine.   No  significant further diarrhea. No  significant dysphagia. For EGD/colon today   HPI:    Kristen Schultz is a 77 y.o. female   For FU Doing well  Occ solid food dysphagia-mostly in mid chest.  No significant heartburn as long as she takes Protonix.  No odynophagia.  No significant regurgitation.  Would like to get endoscopy performed  Diarrhea is much better but still would have episodes of diarrhea especially after eating and when she is under stress.  No nocturnal symptoms.  She is due for repeat colonoscopy.  No weight loss.  She denies having any abdominal pain currently.  Occ dyspgagia  Reflux  B12 was low- on shots now. Nl CBC with hemoglobin 13.8, MCV 92 08/14/2022  Has been eating well. No nausea, vomiting. No significant diarrhea or constipation.  There is no melena or hematochezia. No unintentional weight loss.  No family history of gastric malignancy.   Wt Readings from Last 3 Encounters:  11/17/22 140 lb (63.5 kg)  09/29/22 140 lb (63.5 kg)  06/29/22 145 lb 6.4 oz (66 kg)     Past GI procedures:   -Colonoscopy  02/10/2016-4 mm polyp status post polypectomy, internal hemorrhoids, left colonic diverticulosis. Bx- TA. Rpt in 5 yrs. -EGD 07/23/2019 by Dr. Boyd Kerbs with EUS.  Esophagus normal.  6 mm sessile polyp in the gastric body s/p polypectomy, 10 mm sessile polyp at GE junction s/p polypectomy followed by placement of 4 hemoclips.  Abnormal mucosa in the gastric body which appeared atrophic.  Multiple random biopsies were performed.  Normal duodenum.  bx-hyperplastic polyps with intestinal metaplasia.  Gastric mapping biopsies showed chronic gastritis with intestinal metaplasia.  Suspicious for H. Pylori. 08/18/2018 Gastric polyps, small HH, atrophic gastritis. Bx- HGD polyps (mainly antral but sent in same bottle), gastric metaplasia, neg SB Bx for celiac. -CT abdo/pel 04/10/2018   -Sigmoid diverticulosis with muscular hypertrophy.  -Small hiatal hernia  -Mild gastric wall thickening.  -Liver scar due to previous cyst removal.  No cirrhosis on review.  -Aortic atherosclerosis.  -DJD   SH- avid hiker, wine 2 glases/day  Past Medical History:  Diagnosis Date   Anxiety    Cellulitis of chin 11/17/2020   Colon polyps    COVID-19 11/17/2020   Gastric ulcer    per patient; in the 1970s   GERD (gastroesophageal reflux disease)    H. pylori infection    Heart murmur    Hyperlipidemia    no treatment per pt   Hypertension    Liver cyst    Ovarian cyst 1984   Pneumonia  Past Surgical History:  Procedure Laterality Date   COLONOSCOPY  02/10/2016   DENTAL SURGERY  2000   dental implant   DENTAL SURGERY  2021   2 molars taken out    ESOPHAGOGASTRODUODENOSCOPY  11/06/2018   [facility]    ESOPHAGOGASTRODUODENOSCOPY  07/23/2019   [facility]    EUS  07/23/2019   [facility]    HEMORRHOID SURGERY     LAPAROSCOPIC LIVER CYST REMOVAL     OVARIAN CYST SURGERY      Family History  Problem Relation Age of Onset   Heart disease Father    Heart failure Sister    Heart  disease Sister    Dementia Sister    Heart disease Brother 37   Heart disease Brother    Alzheimer's disease Brother    Colon cancer Neg Hx    Esophageal cancer Neg Hx    Pancreatic cancer Neg Hx    Stomach cancer Neg Hx    Rectal cancer Neg Hx     Social History   Tobacco Use   Smoking status: Former    Current packs/day: 0.00    Average packs/day: 0.3 packs/day for 15.0 years (4.5 ttl pk-yrs)    Types: Cigarettes    Start date: 11/18/1970    Quit date: 11/17/1985    Years since quitting: 37.0   Smokeless tobacco: Never  Vaping Use   Vaping status: Never Used  Substance Use Topics   Alcohol use: Yes    Alcohol/week: 14.0 standard drinks of alcohol    Types: 14 Glasses of wine per week    Comment: 2 glasses per night   Drug use: No    Current Outpatient Medications  Medication Sig Dispense Refill   amLODipine (NORVASC) 2.5 MG tablet TAKE 1 TABLET BY MOUTH EVERY DAY 30 tablet 2   citalopram (CELEXA) 20 MG tablet Take 1 tablet (20 mg total) by mouth daily. 90 tablet 1   clindamycin (CLEOCIN T) 1 % external solution Apply topically 2 (two) times daily. 60 mL 3   losartan (COZAAR) 50 MG tablet Take 1 tablet (50 mg total) by mouth daily. 90 tablet 1   Multiple Vitamin (MULTIVITAMIN) capsule Take 1 capsule by mouth daily.     Cholecalciferol (VITAMIN D-3) 125 MCG (5000 UT) TABS Take 1 tablet by mouth daily.     cyanocobalamin 100 MCG tablet Take by mouth.     pantoprazole (PROTONIX) 40 MG tablet TAKE 1 TABLET BY MOUTH EVERY DAY (Patient not taking: Reported on 11/17/2022) 30 tablet 3   tretinoin (RETIN-A) 0.025 % cream Apply topically at bedtime. 45 g 0   Current Facility-Administered Medications  Medication Dose Route Frequency Provider Last Rate Last Admin   0.9 %  sodium chloride infusion  500 mL Intravenous Once Lynann Bologna, MD        Allergies  Allergen Reactions   Bee Venom Anaphylaxis    Bee stings   Codeine     Extreme Vomiting   Other Other (See Comments)     CDN - reaction unknown   Penicillins     Redness, swelling and itching Can take Keflex   Tetracyclines & Related     Yeast Infection and Stomach problems   Xylocaine Dental [Lidocaine-Epinephrine]     Review of Systems:  neg     Physical Exam:    BP 125/70   Pulse 65   Temp (!) 96.8 F (36 C)   Ht 5\' 5"  (1.651 m)  Wt 140 lb (63.5 kg)   SpO2 100%   BMI 23.30 kg/m  Filed Weights   11/17/22 1021  Weight: 140 lb (63.5 kg)   Gen: awake, alert, NAD HEENT: anicteric, no pallor CV: RRR, no mrg Pulm: CTA b/l Abd: soft, NT/ND, +BS throughout Ext: no c/c/e Neuro: nonfocal   CBC:    Latest Ref Rng & Units 09/29/2022   10:10 AM 08/14/2022    6:23 PM 06/29/2022   11:59 AM  CBC  WBC 4.0 - 10.5 K/uL 6.8  6.1  6.8   Hemoglobin 12.0 - 15.0 g/dL 82.9  56.2  13.0   Hematocrit 36.0 - 46.0 % 40.4  40.3  42.3   Platelets 150.0 - 400.0 K/uL 266.0  255  288.0     CMP:    Latest Ref Rng & Units 09/29/2022   10:10 AM 08/14/2022    6:23 PM 06/29/2022   11:59 AM  CMP  Glucose 70 - 99 mg/dL 94  83  80   BUN 6 - 23 mg/dL 19  15  13    Creatinine 0.40 - 1.20 mg/dL 8.65  7.84  6.96   Sodium 135 - 145 mEq/L 138  135  136   Potassium 3.5 - 5.1 mEq/L 5.4 No hemolysis seen  4.2  4.4   Chloride 96 - 112 mEq/L 101  99  99   CO2 19 - 32 mEq/L 29  27  26    Calcium 8.4 - 10.5 mg/dL 9.9  9.3  9.4   Total Protein 6.0 - 8.3 g/dL 7.2   6.8   Total Bilirubin 0.2 - 1.2 mg/dL 0.8   0.6   Alkaline Phos 39 - 117 U/L 58   59   AST 0 - 37 U/L 22   26   ALT 0 - 35 U/L 17   18    Amylase    Component Value Date/Time   AMYLASE 297 (H) 09/29/2022 1010   Lipase     Component Value Date/Time   LIPASE 30.0 09/29/2022 1010         Edman Circle, MD 11/17/2022, 10:37 AM  Cc: Zola Button, Grayling Congress, *

## 2022-11-17 NOTE — Progress Notes (Signed)
Report to PACU, RN, vss, BBS= Clear.  

## 2022-11-17 NOTE — Progress Notes (Signed)
Called to room to assist during endoscopic procedure.  Patient ID and intended procedure confirmed with present staff. Received instructions for my participation in the procedure from the performing physician.  

## 2022-11-17 NOTE — Patient Instructions (Signed)

## 2022-11-17 NOTE — Op Note (Signed)
[facility] Patient Name: Kristen Schultz Procedure Date: 11/17/2022 10:40 AM MRN: [id number] Endoscopist: Lynann Bologna , MD, [id number] Age: 77 Referring MD:  Date of Birth: 10/11/1945 Gender: Female Account #: [id number] Procedure:                Upper GI endoscopy Indications:              H/O gastric polyps, intestinal metaplasia. Medicines:                Monitored Anesthesia Care Procedure:                Pre-Anesthesia Assessment:                           - Prior to the procedure, a History and Physical                            was performed, and patient medications and                            allergies were reviewed. The patient's tolerance of                            previous anesthesia was also reviewed. The risks                            and benefits of the procedure and the sedation                            options and risks were discussed with the patient.                            All questions were answered, and informed consent                            was obtained. Prior Anticoagulants: The patient has                            taken no anticoagulant or antiplatelet agents. ASA                            Grade Assessment: II - A patient with mild systemic                            disease. After reviewing the risks and benefits,                            the patient was deemed in satisfactory condition to                            undergo the procedure.                           After obtaining informed consent, the endoscope was  passed under direct vision. Throughout the                            procedure, the patient's blood pressure, pulse, and                            oxygen saturations were monitored continuously. The                            Olympus Scope [id number] was introduced through the                            mouth, and advanced to the second part of duodenum.                            The  upper GI endoscopy was accomplished without                            difficulty. The patient tolerated the procedure                            well. Scope In: Scope Out: Findings:                 The examined esophagus was normal with well-defined                            Z-line. A small transient hiatal hernia was noted.                           Diffuse moderate inflammation characterized by                            erythema was found in the entire examined stomach.                            Biopsies were taken with a cold forceps for                            histology for gastric mapping (jar 1-antrum lesser                            curvature, jar 2-antrum greater curvature, jar                            3-angularis, jar 4 body lesser curvature, jar                            5-body greater curvature, jar 6-fundus including a                            4 mm polyp).                           The examined duodenum was normal. Complications:  No immediate complications. Estimated Blood Loss:     Estimated blood loss: none. Impression:               - Atrophic gastritis. Recommendation:           - Patient has a contact number available for                            emergencies. The signs and symptoms of potential                            delayed complications were discussed with the                            patient. Return to normal activities tomorrow.                            Written discharge instructions were provided to the                            patient.                           - Resume previous diet.                           - Continue present medications. Can use Protonix on                            as-needed basis.                           - Await pathology results.                           - The findings and recommendations were discussed                            with the patient's family. Lynann Bologna, MD 11/17/2022 11:12:04  AM This report has been signed electronically.

## 2022-11-17 NOTE — Op Note (Signed)
[facility] Patient Name: Kristen Schultz Procedure Date: 11/17/2022 10:30 AM MRN: [id number] Endoscopist: Lynann Bologna , MD, [id number] Age: 77 Referring MD:  Date of Birth: December 11, 1945 Gender: Female Account #: [id number] Procedure:                Colonoscopy Indications:              High risk colon cancer surveillance: Personal                            history of colonic polyps Medicines:                Monitored Anesthesia Care Procedure:                Pre-Anesthesia Assessment:                           - Prior to the procedure, a History and Physical                            was performed, and patient medications and                            allergies were reviewed. The patient's tolerance of                            previous anesthesia was also reviewed. The risks                            and benefits of the procedure and the sedation                            options and risks were discussed with the patient.                            All questions were answered, and informed consent                            was obtained. Prior Anticoagulants: The patient has                            taken no anticoagulant or antiplatelet agents. ASA                            Grade Assessment: II - A patient with mild systemic                            disease. After reviewing the risks and benefits,                            the patient was deemed in satisfactory condition to                            undergo the procedure.  After obtaining informed consent, the colonoscope                            was passed under direct vision. Throughout the                            procedure, the patient's blood pressure, pulse, and                            oxygen saturations were monitored continuously. The                            Olympus Scope [id number] was introduced through the                            anus and advanced to the 2 cm into  the ileum. The                            colonoscopy was performed without difficulty. The                            patient tolerated the procedure well. The quality                            of the bowel preparation was good. The terminal                            ileum, ileocecal valve, appendiceal orifice, and                            rectum were photographed. Scope In: 10:55:19 AM Scope Out: 11:05:08 AM Scope Withdrawal Time: 0 hours 4 minutes 48 seconds  Total Procedure Duration: 0 hours 9 minutes 49 seconds  Findings:                 Multiple medium-mouthed diverticula were found in                            the sigmoid colon with luminal narrowing consistent                            of muscular hypertrophy. Few diverticula in                            descending colon and ascending colon.                           Non-bleeding internal hemorrhoids were found during                            retroflexion. The hemorrhoids were moderate and                            Grade I (internal hemorrhoids that do not prolapse).  The terminal ileum appeared normal.                           The exam was otherwise without abnormality on                            direct and retroflexion views. Complications:            No immediate complications. Estimated Blood Loss:     Estimated blood loss: none. Impression:               - Moderate to severe predominantly sigmoid                            diverticulosis.                           - Non-bleeding internal hemorrhoids.                           - The examined portion of the ileum was normal.                           - The examination was otherwise normal on direct                            and retroflexion views.                           - No specimens collected. Recommendation:           - Patient has a contact number available for                            emergencies. The signs and symptoms of  potential                            delayed complications were discussed with the                            patient. Return to normal activities tomorrow.                            Written discharge instructions were provided to the                            patient.                           - High fiber diet.                           - Continue present medications.                           - Repeat colonoscopy is not recommended for  surveillance.                           - The findings and recommendations were discussed                            with the patient's family. Lynann Bologna, MD 11/17/2022 11:15:30 AM This report has been signed electronically.

## 2022-11-18 ENCOUNTER — Telehealth: Payer: Self-pay | Admitting: *Deleted

## 2022-11-18 NOTE — Telephone Encounter (Signed)
  Follow up Call-     11/17/2022   10:28 AM  Call back number  Post procedure Call Back phone  # [phone]  Permission to leave phone message Yes     Patient questions:  Do you have a fever, pain , or abdominal swelling? No. Pain Score  0 *  Have you tolerated food without any problems? Yes.    Have you been able to return to your normal activities? Yes.    Do you have any questions about your discharge instructions: Diet   No. Medications  No. Follow up visit  No.  Do you have questions or concerns about your Care? No.  Actions: * If pain score is 4 or above: No action needed, pain <4.

## 2022-11-20 DIAGNOSIS — M9903 Segmental and somatic dysfunction of lumbar region: Secondary | ICD-10-CM | POA: Diagnosis not present

## 2022-11-20 DIAGNOSIS — M9905 Segmental and somatic dysfunction of pelvic region: Secondary | ICD-10-CM | POA: Diagnosis not present

## 2022-11-20 DIAGNOSIS — M9904 Segmental and somatic dysfunction of sacral region: Secondary | ICD-10-CM | POA: Diagnosis not present

## 2022-11-20 DIAGNOSIS — M9902 Segmental and somatic dysfunction of thoracic region: Secondary | ICD-10-CM | POA: Diagnosis not present

## 2022-11-20 DIAGNOSIS — M9901 Segmental and somatic dysfunction of cervical region: Secondary | ICD-10-CM | POA: Diagnosis not present

## 2022-11-20 DIAGNOSIS — M5033 Other cervical disc degeneration, cervicothoracic region: Secondary | ICD-10-CM | POA: Diagnosis not present

## 2022-11-20 DIAGNOSIS — M5117 Intervertebral disc disorders with radiculopathy, lumbosacral region: Secondary | ICD-10-CM | POA: Diagnosis not present

## 2022-11-23 DIAGNOSIS — M9903 Segmental and somatic dysfunction of lumbar region: Secondary | ICD-10-CM | POA: Diagnosis not present

## 2022-11-23 DIAGNOSIS — M5117 Intervertebral disc disorders with radiculopathy, lumbosacral region: Secondary | ICD-10-CM | POA: Diagnosis not present

## 2022-11-23 DIAGNOSIS — M5033 Other cervical disc degeneration, cervicothoracic region: Secondary | ICD-10-CM | POA: Diagnosis not present

## 2022-11-23 DIAGNOSIS — M9904 Segmental and somatic dysfunction of sacral region: Secondary | ICD-10-CM | POA: Diagnosis not present

## 2022-11-23 DIAGNOSIS — M9905 Segmental and somatic dysfunction of pelvic region: Secondary | ICD-10-CM | POA: Diagnosis not present

## 2022-11-23 DIAGNOSIS — M9901 Segmental and somatic dysfunction of cervical region: Secondary | ICD-10-CM | POA: Diagnosis not present

## 2022-11-23 DIAGNOSIS — M9902 Segmental and somatic dysfunction of thoracic region: Secondary | ICD-10-CM | POA: Diagnosis not present

## 2022-11-27 ENCOUNTER — Encounter: Payer: Self-pay | Admitting: Gastroenterology

## 2022-12-01 DIAGNOSIS — M5033 Other cervical disc degeneration, cervicothoracic region: Secondary | ICD-10-CM | POA: Diagnosis not present

## 2022-12-01 DIAGNOSIS — M9901 Segmental and somatic dysfunction of cervical region: Secondary | ICD-10-CM | POA: Diagnosis not present

## 2022-12-01 DIAGNOSIS — M9903 Segmental and somatic dysfunction of lumbar region: Secondary | ICD-10-CM | POA: Diagnosis not present

## 2022-12-01 DIAGNOSIS — M9905 Segmental and somatic dysfunction of pelvic region: Secondary | ICD-10-CM | POA: Diagnosis not present

## 2022-12-01 DIAGNOSIS — M5117 Intervertebral disc disorders with radiculopathy, lumbosacral region: Secondary | ICD-10-CM | POA: Diagnosis not present

## 2022-12-01 DIAGNOSIS — M9904 Segmental and somatic dysfunction of sacral region: Secondary | ICD-10-CM | POA: Diagnosis not present

## 2022-12-01 DIAGNOSIS — M9902 Segmental and somatic dysfunction of thoracic region: Secondary | ICD-10-CM | POA: Diagnosis not present

## 2022-12-08 DIAGNOSIS — M9903 Segmental and somatic dysfunction of lumbar region: Secondary | ICD-10-CM | POA: Diagnosis not present

## 2022-12-08 DIAGNOSIS — M9905 Segmental and somatic dysfunction of pelvic region: Secondary | ICD-10-CM | POA: Diagnosis not present

## 2022-12-08 DIAGNOSIS — M5033 Other cervical disc degeneration, cervicothoracic region: Secondary | ICD-10-CM | POA: Diagnosis not present

## 2022-12-08 DIAGNOSIS — M5117 Intervertebral disc disorders with radiculopathy, lumbosacral region: Secondary | ICD-10-CM | POA: Diagnosis not present

## 2022-12-08 DIAGNOSIS — M9902 Segmental and somatic dysfunction of thoracic region: Secondary | ICD-10-CM | POA: Diagnosis not present

## 2022-12-08 DIAGNOSIS — M9901 Segmental and somatic dysfunction of cervical region: Secondary | ICD-10-CM | POA: Diagnosis not present

## 2022-12-08 DIAGNOSIS — M9904 Segmental and somatic dysfunction of sacral region: Secondary | ICD-10-CM | POA: Diagnosis not present

## 2022-12-14 ENCOUNTER — Ambulatory Visit: Payer: Medicare HMO

## 2022-12-23 ENCOUNTER — Ambulatory Visit: Payer: Medicare HMO | Admitting: *Deleted

## 2022-12-23 DIAGNOSIS — E538 Deficiency of other specified B group vitamins: Secondary | ICD-10-CM

## 2022-12-23 MED ORDER — CYANOCOBALAMIN 1000 MCG/ML IJ SOLN
1000.0000 ug | Freq: Once | INTRAMUSCULAR | Status: AC
Start: 2022-12-23 — End: 2022-12-23
  Administered 2022-12-23: 1000 ug via INTRAMUSCULAR

## 2022-12-23 NOTE — Progress Notes (Signed)
Kristen Schultz is a 77 y.o. female presents to the office today for 3rd Monthly B12 injection, per physician's orders. Original order: 06/29/2022: "B12 is low----- she may benefit from b12 injections weekly x4 then monthly." Cyanocobalamin 1000 mg/ml IM was administered L deltoid today. Patient tolerated injection. Patient due for follow up labs/provider appt: yes, scheduled for 01/13/23 with pcp.

## 2023-01-13 ENCOUNTER — Ambulatory Visit: Payer: Medicare HMO | Admitting: Family Medicine

## 2023-01-13 ENCOUNTER — Encounter: Payer: Self-pay | Admitting: Family Medicine

## 2023-01-13 VITALS — BP 136/88 | HR 79 | Temp 98.6°F | Resp 18 | Ht 65.0 in | Wt 140.6 lb

## 2023-01-13 DIAGNOSIS — E538 Deficiency of other specified B group vitamins: Secondary | ICD-10-CM

## 2023-01-13 DIAGNOSIS — R748 Abnormal levels of other serum enzymes: Secondary | ICD-10-CM

## 2023-01-13 DIAGNOSIS — F411 Generalized anxiety disorder: Secondary | ICD-10-CM

## 2023-01-13 DIAGNOSIS — E785 Hyperlipidemia, unspecified: Secondary | ICD-10-CM

## 2023-01-13 DIAGNOSIS — Z23 Encounter for immunization: Secondary | ICD-10-CM

## 2023-01-13 DIAGNOSIS — I10 Essential (primary) hypertension: Secondary | ICD-10-CM | POA: Diagnosis not present

## 2023-01-13 LAB — CBC WITH DIFFERENTIAL/PLATELET
Basophils Absolute: 0.1 10*3/uL (ref 0.0–0.1)
Basophils Relative: 1.4 % (ref 0.0–3.0)
Eosinophils Absolute: 0.1 10*3/uL (ref 0.0–0.7)
Eosinophils Relative: 2 % (ref 0.0–5.0)
HCT: 44.7 % (ref 36.0–46.0)
Hemoglobin: 14.4 g/dL (ref 12.0–15.0)
Lymphocytes Relative: 20.3 % (ref 12.0–46.0)
Lymphs Abs: 1.2 10*3/uL (ref 0.7–4.0)
MCHC: 32.3 g/dL (ref 30.0–36.0)
MCV: 98.1 fL (ref 78.0–100.0)
Monocytes Absolute: 0.6 10*3/uL (ref 0.1–1.0)
Monocytes Relative: 10.2 % (ref 3.0–12.0)
Neutro Abs: 3.9 10*3/uL (ref 1.4–7.7)
Neutrophils Relative %: 66.1 % (ref 43.0–77.0)
Platelets: 256 10*3/uL (ref 150.0–400.0)
RBC: 4.56 Mil/uL (ref 3.87–5.11)
RDW: 13.6 % (ref 11.5–15.5)
WBC: 5.9 10*3/uL (ref 4.0–10.5)

## 2023-01-13 LAB — COMPREHENSIVE METABOLIC PANEL
ALT: 12 U/L (ref 0–35)
AST: 21 U/L (ref 0–37)
Albumin: 4.2 g/dL (ref 3.5–5.2)
Alkaline Phosphatase: 56 U/L (ref 39–117)
BUN: 14 mg/dL (ref 6–23)
CO2: 29 meq/L (ref 19–32)
Calcium: 9.5 mg/dL (ref 8.4–10.5)
Chloride: 102 meq/L (ref 96–112)
Creatinine, Ser: 0.74 mg/dL (ref 0.40–1.20)
GFR: 78.35 mL/min (ref >60.00)
Glucose, Bld: 82 mg/dL (ref 70–99)
Potassium: 4.5 meq/L (ref 3.5–5.1)
Sodium: 138 meq/L (ref 135–145)
Total Bilirubin: 0.8 mg/dL (ref 0.2–1.2)
Total Protein: 6.9 g/dL (ref 6.0–8.3)

## 2023-01-13 LAB — LIPID PANEL
Cholesterol: 224 mg/dL — ABNORMAL HIGH (ref 0–200)
HDL: 97.2 mg/dL (ref >39.00)
LDL Cholesterol: 115 mg/dL — ABNORMAL HIGH (ref 0–99)
NonHDL: 126.91
Total CHOL/HDL Ratio: 2
Triglycerides: 61 mg/dL (ref 0.0–149.0)
VLDL: 12.2 mg/dL (ref 0.0–40.0)

## 2023-01-13 LAB — TSH: TSH: 1.86 u[IU]/mL (ref 0.35–5.50)

## 2023-01-13 LAB — VITAMIN B12: Vitamin B-12: 218 pg/mL (ref 211–911)

## 2023-01-13 LAB — AMYLASE: Amylase: 344 U/L — ABNORMAL HIGH (ref 27–131)

## 2023-01-13 LAB — VITAMIN D 25 HYDROXY (VIT D DEFICIENCY, FRACTURES): VITD: 59.13 ng/mL (ref 30.00–100.00)

## 2023-01-13 MED ORDER — CITALOPRAM HYDROBROMIDE 10 MG PO TABS
10.0000 mg | ORAL_TABLET | Freq: Every day | ORAL | 1 refills | Status: DC
Start: 2023-01-13 — End: 2023-07-11

## 2023-01-13 MED ORDER — CYANOCOBALAMIN 1000 MCG/ML IJ SOLN
1000.0000 ug | Freq: Once | INTRAMUSCULAR | Status: AC
  Administered 2023-01-13: 1000 ug via INTRAMUSCULAR

## 2023-01-13 NOTE — Patient Instructions (Signed)

## 2023-01-13 NOTE — Assessment & Plan Note (Signed)
Encourage heart healthy diet such as MIND or DASH diet, increase exercise, avoid trans fats, simple carbohydrates and processed foods, consider a krill or fish or flaxseed oil cap daily.  °

## 2023-01-13 NOTE — Assessment & Plan Note (Signed)
Check labs today.

## 2023-01-13 NOTE — Progress Notes (Signed)
Established Patient Office Visit  Subjective   Patient ID: Kristen Schultz, female    DOB: 1946/01/20  Age: 77 y.o. MRN: [id number]  Chief Complaint  Patient presents with   Hypertension   Follow-up    HPI Discussed the use of AI scribe software for clinical note transcription with the patient, who gave verbal consent to proceed.  History of Present Illness   The patient, with a history of hypertension and gastrointestinal issues, presents after self-adjusting her medication regimen due to feeling unwell. She reduced her citalopram dosage to half and started B12 shots, which she believes have helped. She reports occasional low blood pressure readings at home, but has not been monitoring it regularly.  The patient also reports chronic back pain, for which she has been seeing a [facility]. She notes that she often needs to lie down for an hour in the afternoons due to her back pain, which she describes as a burning sensation.  In addition, the patient has a history of diarrhea and bloating, which has improved since starting a prebiotic fiber supplement. She has not received a flu shot or COVID-19 vaccine this year, and expresses concerns about potential side effects.      Patient Active Problem List   Diagnosis Date Noted   Other fatigue 06/29/2022   Osteopenia 06/29/2022   Adult acne 01/18/2022   RUQ pain 06/11/2021   Skin rash 12/31/2020   Dysuria 12/31/2020   Hyperlipidemia 12/15/2020   B12 deficiency 12/10/2019   Palpitation 12/10/2019   Polyp of colon 12/10/2019   Lower abdominal pain 12/10/2019   Adjustment disorder with mixed anxiety and depressed mood 12/10/2019   Bradycardia 12/10/2019   Elevated amylase 05/24/2018   Gastroesophageal reflux disease 05/24/2018   Preventative health care 05/24/2018   Elevated rheumatoid factor 05/24/2018   Right shoulder pain 10/06/2015   Generalized anxiety disorder 11/11/2014   Essential hypertension 11/11/2014   Past  Medical History:  Diagnosis Date   Anxiety    Cellulitis of chin 11/17/2020   Colon polyps    COVID-19 11/17/2020   Gastric ulcer    per patient; in the 1970s   GERD (gastroesophageal reflux disease)    H. pylori infection    Heart murmur    Hyperlipidemia    no treatment per pt   Hypertension    Liver cyst    Ovarian cyst 1984   Pneumonia    Past Surgical History:  Procedure Laterality Date   COLONOSCOPY  02/10/2016   DENTAL SURGERY  2000   dental implant   DENTAL SURGERY  2021   2 molars taken out    ESOPHAGOGASTRODUODENOSCOPY  11/06/2018   [facility]    ESOPHAGOGASTRODUODENOSCOPY  07/23/2019   [facility]    EUS  07/23/2019   [facility]    HEMORRHOID SURGERY     LAPAROSCOPIC LIVER CYST REMOVAL     OVARIAN CYST SURGERY     Social History   Tobacco Use   Smoking status: Former    Current packs/day: 0.00    Average packs/day: 0.3 packs/day for 15.0 years (4.5 ttl pk-yrs)    Types: Cigarettes    Start date: 11/18/1970    Quit date: 11/17/1985    Years since quitting: 37.1   Smokeless tobacco: Never  Vaping Use   Vaping status: Never Used  Substance Use Topics   Alcohol use: Yes    Alcohol/week: 14.0 standard drinks of alcohol    Types: 14 Glasses of wine  per week    Comment: 2 glasses per night   Drug use: No   Social History   Socioeconomic History   Marital status: Married    Spouse name: Tom   Number of children: 1   Years of education: Not on file   Highest education level: 12th grade  Occupational History   Occupation: retired    Comment: retired  Tobacco Use   Smoking status: Former    Current packs/day: 0.00    Average packs/day: 0.3 packs/day for 15.0 years (4.5 ttl pk-yrs)    Types: Cigarettes    Start date: 11/18/1970    Quit date: 11/17/1985    Years since quitting: 37.1   Smokeless tobacco: Never  Vaping Use   Vaping status: Never Used  Substance and Sexual Activity   Alcohol use: Yes    Alcohol/week: 14.0  standard drinks of alcohol    Types: 14 Glasses of wine per week    Comment: 2 glasses per night   Drug use: No   Sexual activity: Not Currently    Partners: Male  Other Topics Concern   Not on file  Social History Narrative   Exercise- Walks about 15-22 miles per week with womens group.   Social Determinants of Health   Financial Resource Strain: Low Risk  (01/12/2023)   Overall Financial Resource Strain (CARDIA)    Difficulty of Paying Living Expenses: Not hard at all  Food Insecurity: No Food Insecurity (01/12/2023)   Hunger Vital Sign    Worried About Running Out of Food in the Last Year: Never true    Ran Out of Food in the Last Year: Never true  Transportation Needs: No Transportation Needs (01/12/2023)   PRAPARE - Administrator, Civil Service (Medical): No    Lack of Transportation (Non-Medical): No  Physical Activity: Insufficiently Active (01/12/2023)   Exercise Vital Sign    Days of Exercise per Week: 7 days    Minutes of Exercise per Session: 20 min  Stress: No Stress Concern Present (01/12/2023)   [employer] of Occupational Health - Occupational Stress Questionnaire    Feeling of Stress : Not at all  Social Connections: Socially Integrated (01/12/2023)   Social Connection and Isolation Panel [NHANES]    Frequency of Communication with Friends and Family: Twice a week    Frequency of Social Gatherings with Friends and Family: Twice a week    Attends Religious Services: 1 to 4 times per year    Active Member of [employer] or Organizations: Yes    Attends Engineer, structural: More than 4 times per year    Marital Status: Married  Catering manager Violence: Not At Risk (01/25/2022)   Humiliation, Afraid, Rape, and Kick questionnaire    Fear of Current or Ex-Partner: No    Emotionally Abused: No    Physically Abused: No    Sexually Abused: No   Family Status  Relation Name Status   Mother  Deceased at age 49       leukemia   Father   Deceased at age 72       heart attack    Sister  Deceased   Brother Donnie Coffin Deceased at age 62       heart attack   Brother RC Alive   Neg Hx  (Not Specified)  No partnership data on file   Family History  Problem Relation Age of Onset   Heart disease Father    Heart failure Sister  Heart disease Sister    Dementia Sister    Heart disease Brother 69   Heart disease Brother    Alzheimer's disease Brother    Colon cancer Neg Hx    Esophageal cancer Neg Hx    Pancreatic cancer Neg Hx    Stomach cancer Neg Hx    Rectal cancer Neg Hx    Allergies  Allergen Reactions   Bee Venom Anaphylaxis    Bee stings   Codeine     Extreme Vomiting   Other Other (See Comments)    CDN - reaction unknown   Penicillins     Redness, swelling and itching Can take Keflex   Tetracyclines & Related     Yeast Infection and Stomach problems   Xylocaine Dental [Lidocaine-Epinephrine (Pf)]     Review of Systems  Constitutional:  Negative for chills, fever and malaise/fatigue.  HENT:  Negative for congestion and hearing loss.   Eyes:  Negative for blurred vision and discharge.  Respiratory:  Negative for cough, sputum production and shortness of breath.   Cardiovascular:  Negative for chest pain, palpitations and leg swelling.  Gastrointestinal:  Negative for abdominal pain, blood in stool, constipation, diarrhea, heartburn, nausea and vomiting.  Genitourinary:  Negative for dysuria, frequency, hematuria and urgency.  Musculoskeletal:  Negative for back pain, falls and myalgias.  Skin:  Negative for rash.  Neurological:  Negative for dizziness, sensory change, loss of consciousness, weakness and headaches.  Endo/Heme/Allergies:  Negative for environmental allergies. Does not bruise/bleed easily.  Psychiatric/Behavioral:  Negative for depression and suicidal ideas. The patient is not nervous/anxious and does not have insomnia.       Objective:     BP 136/88 (BP Location: Left Arm, Patient  Position: Sitting, Cuff Size: Normal)   Pulse 79   Temp 98.6 F (37 C) (Oral)   Resp 18   Ht 5\' 5"  (1.651 m)   Wt 140 lb 9.6 oz (63.8 kg)   SpO2 99%   BMI 23.40 kg/m  BP Readings from Last 3 Encounters:  01/13/23 136/88  11/17/22 (!) 137/59  09/29/22 120/78   Wt Readings from Last 3 Encounters:  01/13/23 140 lb 9.6 oz (63.8 kg)  11/17/22 140 lb (63.5 kg)  09/29/22 140 lb (63.5 kg)   SpO2 Readings from Last 3 Encounters:  01/13/23 99%  11/17/22 99%  08/14/22 99%      Physical Exam Vitals and nursing note reviewed.  Constitutional:      General: She is not in acute distress.    Appearance: Normal appearance. She is well-developed.  HENT:     Head: Normocephalic and atraumatic.  Eyes:     General: No scleral icterus.       Right eye: No discharge.        Left eye: No discharge.  Cardiovascular:     Rate and Rhythm: Normal rate and regular rhythm.     Heart sounds: No murmur heard. Pulmonary:     Effort: Pulmonary effort is normal. No respiratory distress.     Breath sounds: Normal breath sounds.  Musculoskeletal:        General: Normal range of motion.     Cervical back: Normal range of motion and neck supple.     Right lower leg: No edema.     Left lower leg: No edema.  Skin:    General: Skin is warm and dry.  Neurological:     Mental Status: She is alert and oriented to person, place, and  time.  Psychiatric:        Mood and Affect: Mood normal.        Behavior: Behavior normal.        Thought Content: Thought content normal.        Judgment: Judgment normal.      No results found for any visits on 01/13/23.  Last CBC Lab Results  Component Value Date   WBC 6.8 09/29/2022   HGB 13.3 09/29/2022   HCT 40.4 09/29/2022   MCV 95.0 09/29/2022   MCH 31.8 08/14/2022   RDW 13.0 09/29/2022   PLT 266.0 09/29/2022   Last metabolic panel Lab Results  Component Value Date   GLUCOSE 94 09/29/2022   NA 138 09/29/2022   K 5.4 No hemolysis seen (H)  09/29/2022   CL 101 09/29/2022   CO2 29 09/29/2022   BUN 19 09/29/2022   CREATININE 0.79 09/29/2022   GFR 72.59 09/29/2022   CALCIUM 9.9 09/29/2022   PROT 7.2 09/29/2022   ALBUMIN 4.2 09/29/2022   LABGLOB 2.3 06/29/2018   AGRATIO 1.8 06/29/2018   BILITOT 0.8 09/29/2022   ALKPHOS 58 09/29/2022   AST 22 09/29/2022   ALT 17 09/29/2022   ANIONGAP 9 08/14/2022   Last lipids Lab Results  Component Value Date   CHOL 213 (H) 06/29/2022   HDL 90.60 06/29/2022   LDLCALC 109 (H) 06/29/2022   TRIG 65.0 06/29/2022   CHOLHDL 2 06/29/2022   Last hemoglobin A1c Lab Results  Component Value Date   HGBA1C 5.4 04/10/2020   Last thyroid functions Lab Results  Component Value Date   TSH 1.37 06/29/2022   T4TOTAL 5.4 05/19/2015   Last vitamin D Lab Results  Component Value Date   VD25OH 73.98 06/29/2022   Last vitamin B12 and Folate Lab Results  Component Value Date   VITAMINB12 149 (L) 06/29/2022      The 10-year ASCVD risk score (Arnett DK, et al., 2019) is: 26.6%    Assessment & Plan:   Problem List Items Addressed This Visit       Unprioritized   Elevated amylase   Relevant Orders   Amylase   Generalized anxiety disorder   Relevant Medications   citalopram (CELEXA) 10 MG tablet   Essential hypertension - Primary   Relevant Orders   CBC with Differential/Platelet   Comprehensive metabolic panel   Lipid panel   TSH   VITAMIN D 25 Hydroxy (Vit-D Deficiency, Fractures)   Hyperlipidemia    Encourage heart healthy diet such as MIND or DASH diet, increase exercise, avoid trans fats, simple carbohydrates and processed foods, consider a krill or fish or flaxseed oil cap daily.        B12 deficiency    Check labs today      Relevant Orders   Vitamin B12   VITAMIN D 25 Hydroxy (Vit-D Deficiency, Fractures)   Other Visit Diagnoses     Need for influenza vaccination       Relevant Orders   Flu Vaccine Trivalent High Dose (Fluad) (Completed)     Assessment  and Plan    Hypertension Patient self-adjusted medications due to feeling unwell. Currently taking 50mg  Losartan and half of the previously prescribed Citalopram. Blood pressure was slightly elevated during the visit, but patient reported taking Losartan later than usual. -Continue Losartan 50mg  daily. -Check blood pressure at home regularly and report values.  Depression Patient halved the dose of Citalopram due to feeling unwell. -Continue Citalopram at the reduced dose of 10mg   daily.  Vitamin B12 deficiency Patient reports feeling better with B12 injections. -Administer B12 injection today after labs. -Consider switching to sublingual B12 supplements for better absorption.  Gastroesophageal Reflux Disease (GERD) Patient stopped Pantoprazole and reports less bloating with prebiotic fiber. -Continue prebiotic fiber. -Use Pantoprazole as needed during episodes of GERD.  Chronic Back Pain Patient reports benefit from chiropractic treatment but experiences burning pain if not resting in the afternoon. Uses Aspercreme with Lidocaine for relief. -Continue current management strategies. -Consider use of Salonpas patch or cold pack for additional relief.  General Health Maintenance -Complete labs today before B12 injection. -Consider receiving flu shot. -Consider sublingual B12 supplements for better absorption. -Schedule bone density test. -Continue monitoring alcohol intake due to potential impact on blood pressure and GERD.        Return in about 6 months (around 07/14/2023), or if symptoms worsen or fail to improve, for annual exam, fasting.    Donato Schultz, DO

## 2023-01-18 ENCOUNTER — Ambulatory Visit
Admission: RE | Admit: 2023-01-18 | Discharge: 2023-01-18 | Disposition: A | Discharge status: Home / Self Care | Payer: Medicare HMO | Source: Ambulatory Visit | Attending: Family Medicine | Admitting: Family Medicine

## 2023-01-18 DIAGNOSIS — E2839 Other primary ovarian failure: Secondary | ICD-10-CM | POA: Diagnosis not present

## 2023-01-18 DIAGNOSIS — M8588 Other specified disorders of bone density and structure, other site: Secondary | ICD-10-CM | POA: Diagnosis not present

## 2023-01-18 DIAGNOSIS — N958 Other specified menopausal and perimenopausal disorders: Secondary | ICD-10-CM | POA: Diagnosis not present

## 2023-01-18 DIAGNOSIS — M8580 Other specified disorders of bone density and structure, unspecified site: Secondary | ICD-10-CM

## 2023-01-20 DIAGNOSIS — H2513 Age-related nuclear cataract, bilateral: Secondary | ICD-10-CM | POA: Diagnosis not present

## 2023-01-27 ENCOUNTER — Ambulatory Visit: Payer: Medicare HMO

## 2023-01-27 DIAGNOSIS — I10 Essential (primary) hypertension: Secondary | ICD-10-CM

## 2023-01-27 NOTE — Progress Notes (Signed)
BP Readings from Last 3 Encounters:  01/13/23 136/88  11/17/22 (!) 137/59  09/29/22 120/78   Per last ov 01/13/23: Hypertension Patient self-adjusted medications due to feeling unwell. Currently taking 50mg  Losartan and half of the previously prescribed Citalopram. Blood pressure was slightly elevated during the visit, but patient reported taking Losartan later than usual. -Continue Losartan 50mg  daily. -Check blood pressure at home regularly and report values.  Patient here for follow up BP check.  Blood pressure today is 126 /74 with a pulse of 81. Patient advised per Dr. Laury Axon- [place] ok to continue same dose and follow up on her scheduled appointment on April.

## 2023-02-18 ENCOUNTER — Other Ambulatory Visit: Payer: Self-pay | Admitting: Family Medicine

## 2023-02-18 DIAGNOSIS — I10 Essential (primary) hypertension: Secondary | ICD-10-CM

## 2023-03-10 ENCOUNTER — Other Ambulatory Visit: Payer: Self-pay | Admitting: Family Medicine

## 2023-03-10 DIAGNOSIS — Z1231 Encounter for screening mammogram for malignant neoplasm of breast: Secondary | ICD-10-CM

## 2023-04-05 ENCOUNTER — Ambulatory Visit (INDEPENDENT_AMBULATORY_CARE_PROVIDER_SITE_OTHER): Payer: Medicare (Managed Care)

## 2023-04-05 VITALS — Ht 65.0 in | Wt 140.0 lb

## 2023-04-05 DIAGNOSIS — Z0000 Encounter for general adult medical examination without abnormal findings: Secondary | ICD-10-CM | POA: Diagnosis not present

## 2023-04-05 NOTE — Patient Instructions (Addendum)
 Kristen Schultz , Thank you for taking time to come for your Medicare Wellness Visit. I appreciate your ongoing commitment to your health goals. Please review the following plan we discussed and let me know if I can assist you in the future.   Referrals/Orders/Follow-Ups/Clinician Recommendations:   This is a list of the screening recommended for you and due dates:  Health Maintenance  Topic Date Due   COVID-19 Vaccine (6 - 2024-25 season) 11/21/2022   Mammogram  04/24/2023   Medicare Annual Wellness Visit  04/04/2024   DEXA scan (bone density measurement)  01/17/2025   Colon Cancer Screening  11/17/2027   DTaP/Tdap/Td vaccine (3 - Td or Tdap) 07/04/2029   Pneumonia Vaccine  Completed   Flu Shot  Completed   Hepatitis C Screening  Completed   Zoster (Shingles) Vaccine  Completed   HPV Vaccine  Aged Out    Advanced directives: (In Chart) A copy of your advanced directives are scanned into your chart should your provider ever need it.  Next Medicare Annual Wellness Visit scheduled for next year: Yes

## 2023-04-05 NOTE — Progress Notes (Signed)
 Subjective:   Kristen Schultz is a 78 y.o. female who presents for Medicare Annual (Subsequent) preventive examination.  Visit Complete: Virtual I connected with  [name] on 04/05/23 by a audio enabled telemedicine application and verified that I am speaking with the correct person using two identifiers.  Patient Location: Home  Provider Location: Home Office  I discussed the limitations of evaluation and management by telemedicine. The patient expressed understanding and agreed to proceed.  Vital Signs: Because this visit was a virtual/telehealth visit, some criteria may be missing or patient reported. Any vitals not documented were not able to be obtained and vitals that have been documented are patient reported.  Patient Medicare AWV questionnaire was completed by the patient on 04/04/23; I have confirmed that all information answered by patient is correct and no changes since this date.  Cardiac Risk Factors include: advanced age (>82men, >15 women);hypertension     Objective:    Today's Vitals   04/05/23 0856  Weight: 140 lb (63.5 kg)  Height: 5' 5 (1.651 m)   Body mass index is 23.3 kg/m.     04/05/2023    9:05 AM 08/14/2022    6:13 PM 01/25/2022    2:41 PM 01/13/2021   10:32 AM 12/04/2018   10:14 AM 11/29/2017    9:53 AM 11/18/2015    9:10 AM  Advanced Directives  Does Patient Have a Medical Advance Directive? Yes Yes Yes Yes Yes Yes Yes  Type of Estate Agent of [place];Living will Healthcare Power of [place];Living will Healthcare Power of [place];Living will Healthcare Power of [place];Living will Healthcare Power of [place];Living will Healthcare Power of [place];Living will Healthcare Power of [place];Living will  Does patient want to make changes to medical advance directive? No - Patient declined  No - Patient declined  No - Patient declined  No - Patient declined  Copy of Healthcare Power of Attorney in Chart? Yes -  validated most recent copy scanned in chart (See row information)  Yes - validated most recent copy scanned in chart (See row information) Yes - validated most recent copy scanned in chart (See row information) Yes - validated most recent copy scanned in chart (See row information) Yes No - copy requested    Current Medications (verified) Outpatient Encounter Medications as of 04/05/2023  Medication Sig   citalopram  (CELEXA ) 10 MG tablet Take 1 tablet (10 mg total) by mouth daily.   clindamycin  (CLEOCIN  T) 1 % external solution Apply topically 2 (two) times daily.   losartan  (COZAAR ) 50 MG tablet TAKE 1 TABLET BY MOUTH EVERY DAY   [DISCONTINUED] citalopram  (CELEXA ) 20 MG tablet Take 1 tablet (20 mg total) by mouth daily.   No facility-administered encounter medications on file as of 04/05/2023.    Allergies (verified) Bee venom, Codeine, Other, Penicillins, Tetracyclines & related, and Xylocaine  dental [lidocaine -epinephrine (pf)]   History: Past Medical History:  Diagnosis Date   Anxiety    Cellulitis of chin 11/17/2020   Colon polyps    COVID-19 11/17/2020   Gastric ulcer    per patient; in the 1970s   GERD (gastroesophageal reflux disease)    H. pylori infection    Heart murmur    Hyperlipidemia    no treatment per pt   Hypertension    Liver cyst    Ovarian cyst 1984   Pneumonia    Past Surgical History:  Procedure Laterality Date   COLONOSCOPY  02/10/2016   DENTAL SURGERY  2000  dental implant   DENTAL SURGERY  2021   2 molars taken out    ESOPHAGOGASTRODUODENOSCOPY  11/06/2018   [facility]    ESOPHAGOGASTRODUODENOSCOPY  07/23/2019   [facility]    EUS  07/23/2019   [facility]    HEMORRHOID SURGERY     LAPAROSCOPIC LIVER CYST REMOVAL     OVARIAN CYST SURGERY     Family History  Problem Relation Age of Onset   Heart disease Father    Heart failure Sister    Heart disease Sister    Dementia Sister    Heart disease Brother 43    Heart disease Brother    Alzheimer's disease Brother    Colon cancer Neg Hx    Esophageal cancer Neg Hx    Pancreatic cancer Neg Hx    Stomach cancer Neg Hx    Rectal cancer Neg Hx    Social History   Socioeconomic History   Marital status: Married    Spouse name: Tom   Number of children: 1   Years of education: Not on file   Highest education level: 12th grade  Occupational History   Occupation: retired    Comment: retired  Tobacco Use   Smoking status: Former    Current packs/day: 0.00    Average packs/day: 0.3 packs/day for 15.0 years (4.5 ttl pk-yrs)    Types: Cigarettes    Start date: 11/18/1970    Quit date: 11/17/1985    Years since quitting: 37.4   Smokeless tobacco: Never  Vaping Use   Vaping status: Never Used  Substance and Sexual Activity   Alcohol use: Yes    Alcohol/week: 14.0 standard drinks of alcohol    Types: 14 Glasses of wine per week    Comment: 2 glasses per night   Drug use: No   Sexual activity: Not Currently    Partners: Male  Other Topics Concern   Not on file  Social History Narrative   Exercise- Walks about 15-22 miles per week with womens group.   Social Drivers of Corporate Investment Banker Strain: Low Risk  (04/05/2023)   Overall Financial Resource Strain (CARDIA)    Difficulty of Paying Living Expenses: Not hard at all  Food Insecurity: No Food Insecurity (04/05/2023)   Hunger Vital Sign    Worried About Running Out of Food in the Last Year: Never true    Ran Out of Food in the Last Year: Never true  Transportation Needs: No Transportation Needs (04/05/2023)   PRAPARE - Administrator, Civil Service (Medical): No    Lack of Transportation (Non-Medical): No  Physical Activity: Inactive (04/05/2023)   Exercise Vital Sign    Days of Exercise per Week: 0 days    Minutes of Exercise per Session: 0 min  Stress: No Stress Concern Present (04/05/2023)   [employer] of Occupational Health - Occupational Stress  Questionnaire    Feeling of Stress : Not at all  Social Connections: Socially Integrated (04/05/2023)   Social Connection and Isolation Panel [NHANES]    Frequency of Communication with Friends and Family: More than three times a week    Frequency of Social Gatherings with Friends and Family: More than three times a week    Attends Religious Services: More than 4 times per year    Active Member of [employer] or Organizations: Yes    Attends Banker Meetings: More than 4 times per year    Marital Status:  Married    Tobacco Counseling Counseling given: Not Answered   Clinical Intake:  Pre-visit preparation completed: Yes  Pain : No/denies pain     BMI - recorded: 23.3 Nutritional Status: BMI of 19-24  Normal Nutritional Risks: None Diabetes: No  How often do you need to have someone help you when you read instructions, pamphlets, or other written materials from your doctor or pharmacy?: 1 - Never  Interpreter Needed?: No  Information entered by :: Rojelio Blush LPN   Activities of Daily Living    04/05/2023    9:02 AM 04/04/2023    1:26 PM  In your present state of health, do you have any difficulty performing the following activities:  Hearing? 0 0  Vision? 1 1  Comment Pending appt with Dr Octavia   Difficulty concentrating or making decisions? 0 0  Walking or climbing stairs? 0 0  Dressing or bathing? 0 0  Doing errands, shopping? 0 0  Preparing Food and eating ? N N  Using the Toilet? N N  In the past six months, have you accidently leaked urine? CINDERELLA CINDERELLA  Comment Wears pads.   Do you have problems with loss of bowel control? N N  Managing your Medications? N N  Managing your Finances? N N  Housekeeping or managing your Housekeeping? N N    Patient Care Team: Antonio Meth, Jamee SAUNDERS, DO as PCP - General (Family Medicine) Dann Candyce RAMAN, MD as PCP - Cardiology (Cardiology) Elspeth Lauraine DEL, OD as Referring Physician (Optometry) Cleatrice, Ludie SAUNDERS, MD as  Consulting Physician (Sports Medicine) Camella Fallow, MD as Consulting Physician (Orthopedic Surgery) Blanca Fallow RAMAN, MD as Consulting Physician (Cardiology) [place], MD as Referring Physician Charlanne Kipper, MD as Referring Physician (Gastroenterology)  Indicate any recent Medical Services you may have received from other than [facility] providers in the past year (date may be approximate).     Assessment:   This is a routine wellness examination for [place].  Hearing/Vision screen Hearing Screening - Comments:: Denies hearing difficulties   Vision Screening - Comments:: Wears rx glasses - up to date with routine eye exams with  Dr Octavia   Goals Addressed               This Visit's Progress     Increase physical activity (pt-stated)        Stay Active!       Depression Screen    04/05/2023    9:02 AM 01/13/2023    8:47 AM 06/29/2022    1:27 PM 01/25/2022    2:38 PM 04/02/2021    1:09 PM 03/05/2021    2:03 PM 01/13/2021   10:34 AM  PHQ 2/9 Scores  PHQ - 2 Score 0 0 4 1 0 0 1  PHQ- 9 Score   12  0 0     Fall Risk    04/05/2023    9:04 AM 04/04/2023    1:26 PM 01/13/2023    8:47 AM 06/29/2022   11:25 AM 01/25/2022    2:41 PM  Fall Risk   Falls in the past year? 0 0 0 1 1  Number falls in past yr: 0 0 0 0 1  Injury with Fall? 0 0 0 0 1  Comment     compression Fx in back  Risk for fall due to : No Fall Risks    Impaired vision  Follow up Falls prevention discussed  Falls evaluation completed Falls evaluation completed Falls prevention discussed  MEDICARE RISK AT HOME: Medicare Risk at Home Any stairs in or around the home?: (Patient-Rptd) Yes If so, are there any without handrails?: (Patient-Rptd) No Home free of loose throw rugs in walkways, pet beds, electrical cords, etc?: (Patient-Rptd) Yes Adequate lighting in your home to reduce risk of falls?: (Patient-Rptd) Yes Life alert?: (Patient-Rptd) No Use of a cane, walker or w/c?: (Patient-Rptd) Yes Grab bars in  the bathroom?: (Patient-Rptd) No Shower chair or bench in shower?: (Patient-Rptd) Yes Elevated toilet seat or a handicapped toilet?: (Patient-Rptd) No  TIMED UP AND GO:  Was the test performed?  No    Cognitive Function:    11/18/2015    9:18 AM  MMSE - Mini Mental State Exam  Orientation to time 5  Orientation to Place 5  Registration 3  Attention/ Calculation 5  Recall 3  Language- name 2 objects 2  Language- repeat 1  Language- follow 3 step command 3  Language- read & follow direction 1  Write a sentence 1  Copy design 1  Total score 30        04/05/2023    9:05 AM 01/25/2022    2:44 PM  6CIT Screen  What Year? 0 points 0 points  What month? 0 points 0 points  What time? 0 points 0 points  Count back from 20 0 points 0 points  Months in reverse 0 points 0 points  Repeat phrase 0 points 0 points  Total Score 0 points 0 points    Immunizations Immunization History  Administered Date(s) Administered   Fluad Quad(high Dose 65+) 11/28/2018, 12/10/2019, 12/05/2020, 12/10/2021   Fluad Trivalent(High Dose 65+) 01/13/2023   Influenza Split 12/21/2007   Influenza, High Dose Seasonal PF 02/20/2016, 01/07/2017   Influenza,inj,Quad PF,6+ Mos 01/17/2015   Influenza-Unspecified 01/12/2018   PFIZER(Purple Top)SARS-COV-2 Vaccination 04/26/2019, 05/21/2019, 01/23/2020   Pfizer Covid-19 Vaccine Bivalent Booster 24yrs & up 12/08/2020, 12/10/2021   Pneumococcal Conjugate-13 01/26/2013, 01/17/2015   Pneumococcal Polysaccharide-23 06/23/2011, 04/27/2012   Pneumococcal-Unspecified 01/26/2013   Td 03/22/2009   Tdap 07/05/2019   Zoster Recombinant(Shingrix) 11/18/2017, 03/04/2018   Zoster, Live 03/22/2012    TDAP status: Up to date  Flu Vaccine status: Up to date  Pneumococcal vaccine status: Up to date  Covid-19 vaccine status: Declined, Education has been provided regarding the importance of this vaccine but patient still declined. Advised may receive this vaccine at local  pharmacy or Health Dept.or vaccine clinic. Aware to provide a copy of the vaccination record if obtained from local pharmacy or Health Dept. Verbalized acceptance and understanding.  Qualifies for Shingles Vaccine? Yes   Zostavax completed Yes   Shingrix Completed?: Yes  Screening Tests Health Maintenance  Topic Date Due   COVID-19 Vaccine (6 - 2024-25 season) 11/21/2022   MAMMOGRAM  04/24/2023   Medicare Annual Wellness (AWV)  04/04/2024   DEXA SCAN  01/17/2025   Colonoscopy  11/17/2027   DTaP/Tdap/Td (3 - Td or Tdap) 07/04/2029   Pneumonia Vaccine 84+ Years old  Completed   INFLUENZA VACCINE  Completed   Hepatitis C Screening  Completed   Zoster Vaccines- Shingrix  Completed   HPV VACCINES  Aged Out    Health Maintenance  Health Maintenance Due  Topic Date Due   COVID-19 Vaccine (6 - 2024-25 season) 11/21/2022    Colorectal cancer screening: Type of screening: Colonoscopy. Completed 11/17/22. Repeat every 5 years  Mammogram status: Ordered Scheduled for 04/28/23. Pt provided with contact info and advised to call to schedule appt.   Bone  Density status: Completed 01/08/23. Results reflect: Bone density results: OSTEOPENIA. Repeat every   years.    Additional Screening:  Hepatitis C Screening: does qualify; Completed 05/19/15  Vision Screening: Recommended annual ophthalmology exams for early detection of glaucoma and other disorders of the eye. Is the patient up to date with their annual eye exam?  Yes  Who is the provider or what is the name of the office in which the patient attends annual eye exams? Dr Octavia If pt is not established with a provider, would they like to be referred to a provider to establish care? No .   Dental Screening: Recommended annual dental exams for proper oral hygiene    Community Resource Referral / Chronic Care Management:  CRR required this visit?  No   CCM required this visit?  No     Plan:     I have personally reviewed and  noted the following in the patient's chart:   Medical and social history Use of alcohol, tobacco or illicit drugs  Current medications and supplements including opioid prescriptions. Patient is not currently taking opioid prescriptions. Functional ability and status Nutritional status Physical activity Advanced directives List of other physicians Hospitalizations, surgeries, and ER visits in previous 12 months Vitals Screenings to include cognitive, depression, and falls Referrals and appointments  In addition, I have reviewed and discussed with patient certain preventive protocols, quality metrics, and best practice recommendations. A written personalized care plan for preventive services as well as general preventive health recommendations were provided to patient.     Rojelio LELON Blush, LPN   [id number]   After Visit Summary: (MyChart) Due to this being a telephonic visit, the after visit summary with patients personalized plan was offered to patient via MyChart   Nurse Notes: None

## 2023-04-17 ENCOUNTER — Other Ambulatory Visit: Payer: Self-pay | Admitting: Family Medicine

## 2023-04-17 DIAGNOSIS — F4323 Adjustment disorder with mixed anxiety and depressed mood: Secondary | ICD-10-CM

## 2023-04-28 ENCOUNTER — Ambulatory Visit
Admission: RE | Admit: 2023-04-28 | Discharge: 2023-04-28 | Disposition: A | Discharge status: Home / Self Care | Payer: Medicare (Managed Care) | Source: Ambulatory Visit | Attending: Family Medicine | Admitting: Family Medicine

## 2023-04-28 DIAGNOSIS — Z1231 Encounter for screening mammogram for malignant neoplasm of breast: Secondary | ICD-10-CM

## 2023-05-24 ENCOUNTER — Other Ambulatory Visit: Payer: Self-pay | Admitting: Family Medicine

## 2023-05-24 DIAGNOSIS — I10 Essential (primary) hypertension: Secondary | ICD-10-CM

## 2023-06-22 DIAGNOSIS — H2513 Age-related nuclear cataract, bilateral: Secondary | ICD-10-CM | POA: Diagnosis not present

## 2023-06-22 DIAGNOSIS — H43393 Other vitreous opacities, bilateral: Secondary | ICD-10-CM | POA: Diagnosis not present

## 2023-06-22 DIAGNOSIS — H11132 Conjunctival pigmentations, left eye: Secondary | ICD-10-CM | POA: Diagnosis not present

## 2023-07-08 ENCOUNTER — Other Ambulatory Visit: Payer: Self-pay | Admitting: Family Medicine

## 2023-07-08 DIAGNOSIS — F411 Generalized anxiety disorder: Secondary | ICD-10-CM

## 2023-07-15 ENCOUNTER — Encounter: Payer: Self-pay | Admitting: Family Medicine

## 2023-07-15 ENCOUNTER — Ambulatory Visit (INDEPENDENT_AMBULATORY_CARE_PROVIDER_SITE_OTHER): Payer: Medicare HMO | Admitting: Family Medicine

## 2023-07-15 VITALS — BP 122/82 | HR 74 | Temp 98.8°F | Resp 16 | Ht 65.0 in | Wt 146.6 lb

## 2023-07-15 DIAGNOSIS — Z0000 Encounter for general adult medical examination without abnormal findings: Secondary | ICD-10-CM

## 2023-07-15 DIAGNOSIS — E785 Hyperlipidemia, unspecified: Secondary | ICD-10-CM

## 2023-07-15 DIAGNOSIS — I10 Essential (primary) hypertension: Secondary | ICD-10-CM

## 2023-07-15 DIAGNOSIS — E538 Deficiency of other specified B group vitamins: Secondary | ICD-10-CM

## 2023-07-15 LAB — COMPREHENSIVE METABOLIC PANEL WITH GFR
ALT: 16 U/L (ref 0–35)
AST: 28 U/L (ref 0–37)
Albumin: 4.3 g/dL (ref 3.5–5.2)
Alkaline Phosphatase: 60 U/L (ref 39–117)
BUN: 17 mg/dL (ref 6–23)
CO2: 31 meq/L (ref 19–32)
Calcium: 9.7 mg/dL (ref 8.4–10.5)
Chloride: 99 meq/L (ref 96–112)
Creatinine, Ser: 0.74 mg/dL (ref 0.40–1.20)
GFR: 78.08 mL/min (ref >60.00)
Glucose, Bld: 89 mg/dL (ref 70–99)
Potassium: 4.8 meq/L (ref 3.5–5.1)
Sodium: 137 meq/L (ref 135–145)
Total Bilirubin: 0.8 mg/dL (ref 0.2–1.2)
Total Protein: 7.3 g/dL (ref 6.0–8.3)

## 2023-07-15 LAB — LIPID PANEL
Cholesterol: 223 mg/dL — ABNORMAL HIGH (ref 0–200)
HDL: 100 mg/dL (ref >39.00)
LDL Cholesterol: 113 mg/dL — ABNORMAL HIGH (ref 0–99)
NonHDL: 123.27
Total CHOL/HDL Ratio: 2
Triglycerides: 52 mg/dL (ref 0.0–149.0)
VLDL: 10.4 mg/dL (ref 0.0–40.0)

## 2023-07-15 LAB — CBC WITH DIFFERENTIAL/PLATELET
Basophils Absolute: 0.1 10*3/uL (ref 0.0–0.1)
Basophils Relative: 1.2 % (ref 0.0–3.0)
Eosinophils Absolute: 0.2 10*3/uL (ref 0.0–0.7)
Eosinophils Relative: 2.9 % (ref 0.0–5.0)
HCT: 43 % (ref 36.0–46.0)
Hemoglobin: 14.5 g/dL (ref 12.0–15.0)
Lymphocytes Relative: 18.3 % (ref 12.0–46.0)
Lymphs Abs: 1.1 10*3/uL (ref 0.7–4.0)
MCHC: 33.7 g/dL (ref 30.0–36.0)
MCV: 96.5 fl (ref 78.0–100.0)
Monocytes Absolute: 0.7 10*3/uL (ref 0.1–1.0)
Monocytes Relative: 11.4 % (ref 3.0–12.0)
Neutro Abs: 4.1 10*3/uL (ref 1.4–7.7)
Neutrophils Relative %: 66.2 % (ref 43.0–77.0)
Platelets: 250 10*3/uL (ref 150.0–400.0)
RBC: 4.46 Mil/uL (ref 3.87–5.11)
RDW: 13.5 % (ref 11.5–15.5)
WBC: 6.2 10*3/uL (ref 4.0–10.5)

## 2023-07-15 LAB — TSH: TSH: 2.06 u[IU]/mL (ref 0.35–5.50)

## 2023-07-15 LAB — VITAMIN B12: Vitamin B-12: 393 pg/mL (ref 211–911)

## 2023-07-15 NOTE — Patient Instructions (Signed)
 Preventive Care 43 Years and Older, Female Preventive care refers to lifestyle choices and visits with your health care provider that can promote health and wellness. Preventive care visits are also called wellness exams. What can I expect for my preventive care visit? Counseling Your health care provider may ask you questions about your: Medical history, including: Past medical problems. Family medical history. Pregnancy and menstrual history. History of falls. Current health, including: Memory and ability to understand (cognition). Emotional well-being. Home life and relationship well-being. Sexual activity and sexual health. Lifestyle, including: Alcohol, nicotine or tobacco, and drug use. Access to firearms. Diet, exercise, and sleep habits. Work and work Astronomer. Sunscreen use. Safety issues such as seatbelt and bike helmet use. Physical exam Your health care provider will check your: Height and weight. These may be used to calculate your BMI (body mass index). BMI is a measurement that tells if you are at a healthy weight. Waist circumference. This measures the distance around your waistline. This measurement also tells if you are at a healthy weight and may help predict your risk of certain diseases, such as type 2 diabetes and high blood pressure. Heart rate and blood pressure. Body temperature. Skin for abnormal spots. What immunizations do I need?  Vaccines are usually given at various ages, according to a schedule. Your health care provider will recommend vaccines for you based on your age, medical history, and lifestyle or other factors, such as travel or where you work. What tests do I need? Screening Your health care provider may recommend screening tests for certain conditions. This may include: Lipid and cholesterol levels. Hepatitis C test. Hepatitis B test. HIV (human immunodeficiency virus) test. STI (sexually transmitted infection) testing, if you are at  risk. Lung cancer screening. Colorectal cancer screening. Diabetes screening. This is done by checking your blood sugar (glucose) after you have not eaten for a while (fasting). Mammogram. Talk with your health care provider about how often you should have regular mammograms. BRCA-related cancer screening. This may be done if you have a family history of breast, ovarian, tubal, or peritoneal cancers. Bone density scan. This is done to screen for osteoporosis. Talk with your health care provider about your test results, treatment options, and if necessary, the need for more tests. Follow these instructions at home: Eating and drinking  Eat a diet that includes fresh fruits and vegetables, whole grains, lean protein, and low-fat dairy products. Limit your intake of foods with high amounts of sugar, saturated fats, and salt. Take vitamin and mineral supplements as recommended by your health care provider. Do not drink alcohol if your health care provider tells you not to drink. If you drink alcohol: Limit how much you have to 0-1 drink a day. Know how much alcohol is in your drink. In the [place] one drink equals one 12 oz bottle of beer (355 mL), one 5 oz glass of wine (148 mL), or one 1 oz glass of hard liquor (44 mL). Lifestyle Brush your teeth every morning and night with fluoride toothpaste. Floss one time each day. Exercise for at least 30 minutes 5 or more days each week. Do not use any products that contain nicotine or tobacco. These products include cigarettes, chewing tobacco, and vaping devices, such as e-cigarettes. If you need help quitting, ask your health care provider. Do not use drugs. If you are sexually active, practice safe sex. Use a condom or other form of protection in order to prevent STIs. Take aspirin only as told by  your health care provider. Make sure that you understand how much to take and what form to take. Work with your health care provider to find out whether it  is safe and beneficial for you to take aspirin daily. Ask your health care provider if you need to take a cholesterol-lowering medicine (statin). Find healthy ways to manage stress, such as: Meditation, yoga, or listening to music. Journaling. Talking to a trusted person. Spending time with friends and family. Minimize exposure to UV radiation to reduce your risk of skin cancer. Safety Always wear your seat belt while driving or riding in a vehicle. Do not drive: If you have been drinking alcohol. Do not ride with someone who has been drinking. When you are tired or distracted. While texting. If you have been using any mind-altering substances or drugs. Wear a helmet and other protective equipment during sports activities. If you have firearms in your house, make sure you follow all gun safety procedures. What's next? Visit your health care provider once a year for an annual wellness visit. Ask your health care provider how often you should have your eyes and teeth checked. Stay up to date on all vaccines. This information is not intended to replace advice given to you by your health care provider. Make sure you discuss any questions you have with your health care provider. Document Revised: 09/03/2020 Document Reviewed: 09/03/2020 Elsevier Patient Education  2024 [employer].

## 2023-07-15 NOTE — Progress Notes (Signed)
 +                Established Patient Office Visit  Subjective   Patient ID: Kristen Schultz, female    DOB: 08/31/1945  Age: 78 y.o. MRN: [id number]  Chief Complaint  Patient presents with   Annual Exam    Pt states fasting     HPI Discussed the use of AI scribe software for clinical note transcription with the patient, who gave verbal consent to proceed.  History of Present Illness Kristen Schultz is a 78 year old female who presents for an annual physical exam.  Her blood pressure is well-controlled, and she does not require any medication refills at this time. She has discontinued clindamycin  as she was not using it twice a day, but she still uses it occasionally for flare-ups.  She has been on B12 shots previously, which seemed to help, but she does not consistently take her sublingual B12 supplements. There is a plan to check her B12 levels and perform blood work.  She has a history of cataracts and experiences glare in sunlight. She opted for a second opinion and decided to wait six months before reassessing the need for surgery.  She has a history of arthritis but reports no significant joint issues currently. No concerns about moles or skin changes.  She has not smoked in thirty years and does not have breathing or oxygen level issues. She is aware of the RSV vaccine but has not decided to receive it yet.  Her family history includes her sister who died in 76 from Alzheimer's, and her brother who was diagnosed with Lewy body dementia. Her younger brother died at 51 from heart-related issues, and her father died at 48 from undiagnosed heart disease.   Patient Active Problem List   Diagnosis Date Noted   Other fatigue 06/29/2022   Osteopenia 06/29/2022   Adult acne 01/18/2022   RUQ pain 06/11/2021   Skin rash 12/31/2020   Dysuria 12/31/2020   Hyperlipidemia 12/15/2020   B12 deficiency 12/10/2019   Palpitation 12/10/2019   Polyp of colon 12/10/2019    Adjustment disorder with mixed anxiety and depressed mood 12/10/2019   Bradycardia 12/10/2019   Elevated amylase 05/24/2018   Preventative health care 05/24/2018   Elevated rheumatoid factor 05/24/2018   Generalized anxiety disorder 11/11/2014   Essential hypertension 11/11/2014   Past Medical History:  Diagnosis Date   Anxiety    Cellulitis of chin 11/17/2020   Colon polyps    COVID-19 11/17/2020   Gastric ulcer    per patient; in the 1970s   GERD (gastroesophageal reflux disease)    H. pylori infection    Heart murmur    Hyperlipidemia    no treatment per pt   Hypertension    Liver cyst    Ovarian cyst 1984   Pneumonia    Past Surgical History:  Procedure Laterality Date   COLONOSCOPY  02/10/2016   DENTAL SURGERY  2000   dental implant   DENTAL SURGERY  2021   2 molars taken out    ESOPHAGOGASTRODUODENOSCOPY  11/06/2018   [facility]    ESOPHAGOGASTRODUODENOSCOPY  07/23/2019   [facility]    EUS  07/23/2019   [facility]    HEMORRHOID SURGERY     LAPAROSCOPIC LIVER CYST REMOVAL     OVARIAN CYST SURGERY     Social History   Tobacco Use   Smoking status: Former    Current packs/day: 0.00  Average packs/day: 0.3 packs/day for 15.0 years (4.5 ttl pk-yrs)    Types: Cigarettes    Start date: 11/18/1970    Quit date: 11/17/1985    Years since quitting: 37.6   Smokeless tobacco: Never  Vaping Use   Vaping status: Never Used  Substance Use Topics   Alcohol use: Yes    Alcohol/week: 14.0 standard drinks of alcohol    Types: 14 Glasses of wine per week    Comment: 2 glasses per night   Drug use: No   Social History   Socioeconomic History   Marital status: Married    Spouse name: Tom   Number of children: 1   Years of education: Not on file   Highest education level: 12th grade  Occupational History   Occupation: retired    Comment: retired  Tobacco Use   Smoking status: Former    Current packs/day: 0.00    Average packs/day:  0.3 packs/day for 15.0 years (4.5 ttl pk-yrs)    Types: Cigarettes    Start date: 11/18/1970    Quit date: 11/17/1985    Years since quitting: 37.6   Smokeless tobacco: Never  Vaping Use   Vaping status: Never Used  Substance and Sexual Activity   Alcohol use: Yes    Alcohol/week: 14.0 standard drinks of alcohol    Types: 14 Glasses of wine per week    Comment: 2 glasses per night   Drug use: No   Sexual activity: Not Currently    Partners: Male  Other Topics Concern   Not on file  Social History Narrative   Exercise- Walks about 15-22 miles per week with womens group.   Social Drivers of Corporate investment banker Strain: Low Risk  (04/05/2023)   Overall Financial Resource Strain (CARDIA)    Difficulty of Paying Living Expenses: Not hard at all  Food Insecurity: No Food Insecurity (04/05/2023)   Hunger Vital Sign    Worried About Running Out of Food in the Last Year: Never true    Ran Out of Food in the Last Year: Never true  Transportation Needs: No Transportation Needs (04/05/2023)   PRAPARE - Administrator, Civil Service (Medical): No    Lack of Transportation (Non-Medical): No  Physical Activity: Inactive (04/05/2023)   Exercise Vital Sign    Days of Exercise per Week: 0 days    Minutes of Exercise per Session: 0 min  Stress: No Stress Concern Present (04/05/2023)   [employer] of Occupational Health - Occupational Stress Questionnaire    Feeling of Stress : Not at all  Social Connections: Socially Integrated (04/05/2023)   Social Connection and Isolation Panel [NHANES]    Frequency of Communication with Friends and Family: More than three times a week    Frequency of Social Gatherings with Friends and Family: More than three times a week    Attends Religious Services: More than 4 times per year    Active Member of [employer] or Organizations: Yes    Attends Banker Meetings: More than 4 times per year    Marital Status: Married  Careers information officer Violence: Not At Risk (04/05/2023)   Humiliation, Afraid, Rape, and Kick questionnaire    Fear of Current or Ex-Partner: No    Emotionally Abused: No    Physically Abused: No    Sexually Abused: No   Family Status  Relation Name Status   Mother  Deceased at age 15  leukemia   Father  Deceased at age 27       heart attack    Sister  Deceased   Brother Kendra Pavy Deceased at age 64       heart attack   Brother RC Alive   Neg Hx  (Not Specified)  No partnership data on file   Family History  Problem Relation Age of Onset   Heart disease Father    Heart failure Sister    Heart disease Sister    Dementia Sister    Heart disease Brother 30   Heart disease Brother    Alzheimer's disease Brother    Dementia Brother    Colon cancer Neg Hx    Esophageal cancer Neg Hx    Pancreatic cancer Neg Hx    Stomach cancer Neg Hx    Rectal cancer Neg Hx    BRCA 1/2 Neg Hx    Breast cancer Neg Hx    Allergies  Allergen Reactions   Bee Venom Anaphylaxis    Bee stings   Codeine     Extreme Vomiting   Other Other (See Comments)    CDN - reaction unknown   Penicillins     Redness, swelling and itching Can take Keflex    Tetracyclines & Related     Yeast Infection and Stomach problems   Xylocaine Dental [Lidocaine-Epinephrine (Pf)]       Review of Systems  Constitutional:  Negative for chills, fever and malaise/fatigue.  HENT:  Negative for congestion and hearing loss.   Eyes:  Negative for blurred vision and discharge.  Respiratory:  Negative for cough, sputum production and shortness of breath.   Cardiovascular:  Negative for chest pain, palpitations and leg swelling.  Gastrointestinal:  Negative for abdominal pain, blood in stool, constipation, diarrhea, heartburn, nausea and vomiting.  Genitourinary:  Negative for dysuria, frequency, hematuria and urgency.  Musculoskeletal:  Negative for back pain, falls and myalgias.  Skin:  Negative for rash.  Neurological:  Negative  for dizziness, sensory change, loss of consciousness, weakness and headaches.  Endo/Heme/Allergies:  Negative for environmental allergies. Does not bruise/bleed easily.  Psychiatric/Behavioral:  Negative for depression and suicidal ideas. The patient is not nervous/anxious and does not have insomnia.       Objective:     BP 122/82 (BP Location: Left Arm, Patient Position: Sitting, Cuff Size: Normal)   Pulse 74   Temp 98.8 F (37.1 C) (Oral)   Resp 16   Ht 5\' 5"  (1.651 m)   Wt 146 lb 9.6 oz (66.5 kg)   SpO2 97%   BMI 24.40 kg/m  BP Readings from Last 3 Encounters:  07/15/23 122/82  01/13/23 136/88  11/17/22 (!) 137/59   Wt Readings from Last 3 Encounters:  07/15/23 146 lb 9.6 oz (66.5 kg)  04/05/23 140 lb (63.5 kg)  01/13/23 140 lb 9.6 oz (63.8 kg)   SpO2 Readings from Last 3 Encounters:  07/15/23 97%  01/13/23 99%  11/17/22 99%      Physical Exam Vitals and nursing note reviewed.  Constitutional:      General: She is not in acute distress.    Appearance: Normal appearance. She is well-developed.  HENT:     Head: Normocephalic and atraumatic.     Right Ear: Tympanic membrane, ear canal and external ear normal. There is no impacted cerumen.     Left Ear: Tympanic membrane, ear canal and external ear normal. There is no impacted cerumen.     Nose: Nose normal.  Mouth/Throat:     Mouth: Mucous membranes are moist.     Pharynx: Oropharynx is clear. No oropharyngeal exudate or posterior oropharyngeal erythema.  Eyes:     General: No scleral icterus.       Right eye: No discharge.        Left eye: No discharge.     Conjunctiva/sclera: Conjunctivae normal.     Pupils: Pupils are equal, round, and reactive to light.  Neck:     Thyroid : No thyromegaly or thyroid  tenderness.     Vascular: No JVD.  Cardiovascular:     Rate and Rhythm: Normal rate and regular rhythm.     Heart sounds: Normal heart sounds. No murmur heard. Pulmonary:     Effort: Pulmonary effort is  normal. No respiratory distress.     Breath sounds: Normal breath sounds.  Abdominal:     General: Bowel sounds are normal. There is no distension.     Palpations: Abdomen is soft. There is no mass.     Tenderness: There is no abdominal tenderness. There is no guarding or rebound.  Genitourinary:    Vagina: Normal.  Musculoskeletal:        General: Normal range of motion.     Cervical back: Normal range of motion and neck supple.     Right lower leg: No edema.     Left lower leg: No edema.  Lymphadenopathy:     Cervical: No cervical adenopathy.  Skin:    General: Skin is warm and dry.     Findings: No erythema or rash.  Neurological:     General: No focal deficit present.     Mental Status: She is alert and oriented to person, place, and time.     Cranial Nerves: No cranial nerve deficit.     Deep Tendon Reflexes: Reflexes are normal and symmetric.  Psychiatric:        Mood and Affect: Mood normal.        Behavior: Behavior normal.        Thought Content: Thought content normal.        Judgment: Judgment normal.      No results found for any visits on 07/15/23.  Last CBC Lab Results  Component Value Date   WBC 5.9 01/13/2023   HGB 14.4 01/13/2023   HCT 44.7 01/13/2023   MCV 98.1 01/13/2023   MCH 31.8 08/14/2022   RDW 13.6 01/13/2023   PLT 256.0 01/13/2023   Last metabolic panel Lab Results  Component Value Date   GLUCOSE 82 01/13/2023   NA 138 01/13/2023   K 4.5 01/13/2023   CL 102 01/13/2023   CO2 29 01/13/2023   BUN 14 01/13/2023   CREATININE 0.74 01/13/2023   GFR 78.35 01/13/2023   CALCIUM  9.5 01/13/2023   PROT 6.9 01/13/2023   ALBUMIN 4.2 01/13/2023   LABGLOB 2.3 06/29/2018   AGRATIO 1.8 06/29/2018   BILITOT 0.8 01/13/2023   ALKPHOS 56 01/13/2023   AST 21 01/13/2023   ALT 12 01/13/2023   ANIONGAP 9 08/14/2022   Last lipids Lab Results  Component Value Date   CHOL 224 (H) 01/13/2023   HDL 97.20 01/13/2023   LDLCALC 115 (H) 01/13/2023   TRIG  61.0 01/13/2023   CHOLHDL 2 01/13/2023   Last hemoglobin A1c Lab Results  Component Value Date   HGBA1C 5.4 04/10/2020   Last thyroid  functions Lab Results  Component Value Date   TSH 1.86 01/13/2023   T4TOTAL 5.4 05/19/2015   Last  vitamin D  Lab Results  Component Value Date   VD25OH 59.13 01/13/2023   Last vitamin B12 and Folate Lab Results  Component Value Date   VITAMINB12 218 01/13/2023      The 10-year ASCVD risk score (Arnett DK, et al., 2019) is: 24.8%    Assessment & Plan:   Problem List Items Addressed This Visit       Unprioritized   B12 deficiency   Relevant Orders   Vitamin B12   Preventative health care   Ghm utd Check labs  See AVS Health Maintenance  Topic Date Due   COVID-19 Vaccine (6 - 2024-25 season) 11/21/2022   INFLUENZA VACCINE  10/21/2023   Medicare Annual Wellness (AWV)  04/04/2024   MAMMOGRAM  04/27/2024   DEXA SCAN  01/17/2025   Colonoscopy  11/17/2027   DTaP/Tdap/Td (3 - Td or Tdap) 07/04/2029   Pneumonia Vaccine 7+ Years old  Completed   Hepatitis C Screening  Completed   Zoster Vaccines- Shingrix  Completed   HPV VACCINES  Aged Out   Meningococcal B Vaccine  Aged Out         Hyperlipidemia   Encourage heart healthy diet such as MIND or DASH diet, increase exercise, avoid trans fats, simple carbohydrates and processed foods, consider a krill or fish or flaxseed oil cap daily.        Relevant Orders   CBC with Differential/Platelet   Comprehensive metabolic panel with GFR   Lipid panel   TSH   Essential hypertension - Primary   Well controlled, no changes to meds. Encouraged heart healthy diet such as the DASH diet and exercise as tolerated.        Relevant Orders   CBC with Differential/Platelet   Comprehensive metabolic panel with GFR   Lipid panel   TSH  Assessment and Plan Assessment & Plan Wellness Visit   During her routine wellness visit, her blood pressure is well-controlled with no new complaints.  The importance of vaccinations, including the RSV vaccine for those over 65, was discussed. She is up to date on pneumonia and shingles vaccines and has declined further COVID vaccination due to concerns about pharmaceutical pressures and a preference for minimal medication use. Blood work, including a B12 level, will be performed. She may discuss the RSV vaccine with the pharmacy if interested.  Arthritis   Her chronic arthritis is managed as needed with no acute exacerbations.  Cataracts   She experiences glare in sunlight due to cataracts. After consulting two ophthalmologists, one recommended surgery while the other suggested monitoring. She chose to wait and will re-evaluate in six months.    No follow-ups on file.    Saabir Blyth R Lowne Chase, DO

## 2023-07-15 NOTE — Assessment & Plan Note (Signed)
 Encourage heart healthy diet such as MIND or DASH diet, increase exercise, avoid trans fats, simple carbohydrates and processed foods, consider a krill or fish or flaxseed oil cap daily.

## 2023-07-15 NOTE — Assessment & Plan Note (Signed)
 Ghm utd Check labs  See AVS Health Maintenance  Topic Date Due   COVID-19 Vaccine (6 - 2024-25 season) 11/21/2022   INFLUENZA VACCINE  10/21/2023   Medicare Annual Wellness (AWV)  04/04/2024   MAMMOGRAM  04/27/2024   DEXA SCAN  01/17/2025   Colonoscopy  11/17/2027   DTaP/Tdap/Td (3 - Td or Tdap) 07/04/2029   Pneumonia Vaccine 62+ Years old  Completed   Hepatitis C Screening  Completed   Zoster Vaccines- Shingrix  Completed   HPV VACCINES  Aged Out   Meningococcal B Vaccine  Aged Out

## 2023-07-15 NOTE — Assessment & Plan Note (Signed)
 Well controlled, no changes to meds. Encouraged heart healthy diet such as the DASH diet and exercise as tolerated.

## 2023-07-23 ENCOUNTER — Encounter: Payer: Self-pay | Admitting: Family Medicine

## 2023-08-03 ENCOUNTER — Ambulatory Visit: Payer: Self-pay

## 2023-08-03 ENCOUNTER — Other Ambulatory Visit (HOSPITAL_BASED_OUTPATIENT_CLINIC_OR_DEPARTMENT_OTHER): Payer: Self-pay

## 2023-08-03 ENCOUNTER — Ambulatory Visit (INDEPENDENT_AMBULATORY_CARE_PROVIDER_SITE_OTHER): Admitting: Physician Assistant

## 2023-08-03 VITALS — BP 126/86 | HR 96 | Temp 98.6°F | Resp 18 | Ht 65.0 in | Wt 146.2 lb

## 2023-08-03 DIAGNOSIS — F4323 Adjustment disorder with mixed anxiety and depressed mood: Secondary | ICD-10-CM

## 2023-08-03 DIAGNOSIS — L989 Disorder of the skin and subcutaneous tissue, unspecified: Secondary | ICD-10-CM

## 2023-08-03 DIAGNOSIS — H9311 Tinnitus, right ear: Secondary | ICD-10-CM

## 2023-08-03 MED ORDER — MUPIROCIN 2 % EX OINT
1.0000 | TOPICAL_OINTMENT | Freq: Two times a day (BID) | CUTANEOUS | 0 refills | Status: DC
  Filled 2023-08-03: qty 22, 11d supply, fill #0

## 2023-08-03 NOTE — Progress Notes (Unsigned)
 Established patient visit   Patient: Kristen Schultz   DOB: 06/10/1945   78 y.o. Female  MRN: [id number] Visit Date: 08/03/2023  Today's healthcare provider: Trenton Frock, PA-C   Chief Complaint  Patient presents with   Depression   Subjective     Kristen Schultz is a 78 year old female who presents with worsening stress and depressive symptoms.  Pt reports several life changes, her friends are moving to [place] communities, they lost a family dog, got a new puppy, but had to rehome him due to behavior difficulties. Her husband has been having health issues that require her to do more around the house/yard.   She struggles with the idea of the future-- what that looks like for her and her husband, where they will live, having to downsize.   It came to ahead over the weekend, with physical symptoms of shaking, feeling ill, sleepiness, joint inflammation. She is tearful today. She also reports concentration difficulties and decision paralysis. Denies intention of self harm, SI.  She has been on Celexa  10 mg for 35 years,  once increasing to 20 mg, but found it too much. finding 20 mg too much.   She intermittently experiences 'electrical charges'  sound in her right ear. Reports history of hearing eval and seeing ENT.      She also reports a lesion on the back of her right leg that was scratched and has not healed.      08/03/2023    4:49 PM 07/15/2023    8:35 AM 04/05/2023    9:02 AM  PHQ9 SCORE ONLY  PHQ-9 Total Score 11 0 0    Medications: Outpatient Medications Prior to Visit  Medication Sig   citalopram  (CELEXA ) 10 MG tablet TAKE 1 TABLET BY MOUTH EVERY DAY   losartan  (COZAAR ) 50 MG tablet Take 1 tablet (50 mg total) by mouth daily.   [DISCONTINUED] clindamycin  (CLEOCIN  T) 1 % external solution Apply topically 2 (two) times daily. (Patient not taking: Reported on 08/03/2023)   No facility-administered medications prior to visit.    Review of Systems   Constitutional:  Negative for fatigue and fever.  Respiratory:  Negative for cough and shortness of breath.   Cardiovascular:  Negative for chest pain and leg swelling.  Gastrointestinal:  Negative for abdominal pain.  Neurological:  Negative for dizziness and headaches.  Psychiatric/Behavioral:  Positive for decreased concentration and sleep disturbance. The patient is nervous/anxious.        Objective    BP 126/86 (BP Location: Left Arm, Patient Position: Sitting, Cuff Size: Normal)   Pulse 96   Temp 98.6 F (37 C) (Oral)   Resp 18   Ht 5\' 5"  (1.651 m)   Wt 146 lb 3.2 oz (66.3 kg)   SpO2 97%   BMI 24.33 kg/m    Physical Exam Vitals reviewed.  Constitutional:      Appearance: She is not ill-appearing.  HENT:     Head: Normocephalic.     Ears:     Comments: Serous effusion behind R TM Eyes:     Conjunctiva/sclera: Conjunctivae normal.  Cardiovascular:     Rate and Rhythm: Normal rate.  Pulmonary:     Effort: Pulmonary effort is normal. No respiratory distress.  Neurological:     Mental Status: She is alert and oriented to person, place, and time.  Psychiatric:        Mood and Affect: Mood is anxious. Affect is tearful.  Behavior: Behavior normal.     No results found for any visits on 08/03/23.  Assessment & Plan    Adjustment disorder with mixed anxiety and depressed mood Assessment & Plan: Increased celexa  to 15 mg. Discussed seeing a therapist, pt declines. F/u 4-6 weeks, if no improvement, rec therapy, but consider adding wellbutrin  Skin lesion If no improvement rec dermatology -     Mupirocin ; Apply 1 Application topically 2 (two) times daily.  Dispense: 22 g; Refill: 0  Tinnitus, right ear Intermittent right ear tinnitus, described as electrical charges.  - Consider allergy medication for fluid behind the ear, consider refer to ENT as last eval was 8-10 years ago per pt  Return in about 6 weeks (around 09/14/2023) for depression.        Trenton Frock, PA-C  [facility] [phone] (phone) [phone] (fax)  [facility]

## 2023-08-03 NOTE — Telephone Encounter (Signed)
  Chief Complaint: Depression, fatigue, pain Symptoms: low mood, tired, pain Frequency: persistent Pertinent Negatives: Patient denies all other symptoms Disposition: [] ED /[] Urgent Care (no appt availability in office) / [x] Appointment(In office/virtual)/ []  [facility] [facility]/ [] Home Care/ [] Refused Recommended Disposition /[]  Mobile Bus/ []  Follow-up with PCP Additional Notes:  Called to schedule with PCP, transferred to nurse triage for depression and pain. She has "a lot going on". She feels maybe depressed or if her mood is from her age, or recently recovering from flu. Tearful when speaking of her dogs she recently had to rehome. She is on 10mg  celexa  for several years, feels maybe she needs a change of medication. At one point her Celexa  was increased to 20mg  but felt that was too much at that time.  She occasionally has pain in her ears, history of tinitus. She has been fatigued lately. She has no motivation and has overall body aches. Acute evaluation advised, no available appointments with pcp until next week, scheduled acute visit today with an alternate provider. Educated on care advice as documented in protocol, patient verbalized understanding.    Copied from CRM [id number]. Topic: Clinical - Red Word Triage >> Aug 03, 2023 12:39 PM Aisha D wrote: Red Word that prompted transfer to Nurse Triage: depression, pain  Pt stated that she has been experiencing pain and body aches. Pt stated that she has been depressed recently and would like to discuss the concern with provider. Pt also wanted to schedule an appt to be seen with provider. Reason for Disposition  [1] Depression AND [2] worsening (e.g., sleeping poorly, less able to do activities of daily living)  Protocols used: Depression-A-AH

## 2023-08-04 ENCOUNTER — Encounter: Payer: Self-pay | Admitting: Physician Assistant

## 2023-08-04 NOTE — Assessment & Plan Note (Signed)
 Increased celexa  to 15 mg. Discussed seeing a therapist, pt declines. F/u 4-6 weeks, if no improvement, stress therapy, but consider adding wellbutrin

## 2023-08-08 ENCOUNTER — Encounter: Payer: Self-pay | Admitting: Family Medicine

## 2023-08-10 DIAGNOSIS — M9901 Segmental and somatic dysfunction of cervical region: Secondary | ICD-10-CM | POA: Diagnosis not present

## 2023-08-10 DIAGNOSIS — H9311 Tinnitus, right ear: Secondary | ICD-10-CM | POA: Diagnosis not present

## 2023-08-10 DIAGNOSIS — H9312 Tinnitus, left ear: Secondary | ICD-10-CM | POA: Diagnosis not present

## 2023-08-10 DIAGNOSIS — M542 Cervicalgia: Secondary | ICD-10-CM | POA: Diagnosis not present

## 2023-08-11 ENCOUNTER — Ambulatory Visit: Admitting: Family Medicine

## 2023-08-11 ENCOUNTER — Encounter: Payer: Self-pay | Admitting: Family Medicine

## 2023-08-11 VITALS — BP 120/84 | HR 70 | Temp 97.8°F | Resp 20 | Ht 65.0 in | Wt 142.2 lb

## 2023-08-11 DIAGNOSIS — E538 Deficiency of other specified B group vitamins: Secondary | ICD-10-CM | POA: Diagnosis not present

## 2023-08-11 DIAGNOSIS — I10 Essential (primary) hypertension: Secondary | ICD-10-CM | POA: Diagnosis not present

## 2023-08-11 DIAGNOSIS — F411 Generalized anxiety disorder: Secondary | ICD-10-CM | POA: Diagnosis not present

## 2023-08-11 DIAGNOSIS — R8290 Unspecified abnormal findings in urine: Secondary | ICD-10-CM

## 2023-08-11 DIAGNOSIS — H9311 Tinnitus, right ear: Secondary | ICD-10-CM | POA: Diagnosis not present

## 2023-08-11 DIAGNOSIS — E785 Hyperlipidemia, unspecified: Secondary | ICD-10-CM

## 2023-08-11 LAB — LIPID PANEL
Cholesterol: 199 mg/dL (ref 0–200)
HDL: 65.6 mg/dL (ref >39.00)
LDL Cholesterol: 118 mg/dL — ABNORMAL HIGH (ref 0–99)
NonHDL: 133.2
Total CHOL/HDL Ratio: 3
Triglycerides: 75 mg/dL (ref 0.0–149.0)
VLDL: 15 mg/dL (ref 0.0–40.0)

## 2023-08-11 LAB — COMPREHENSIVE METABOLIC PANEL WITH GFR
ALT: 45 U/L — ABNORMAL HIGH (ref 0–35)
AST: 55 U/L — ABNORMAL HIGH (ref 0–37)
Albumin: 3.8 g/dL (ref 3.5–5.2)
Alkaline Phosphatase: 75 U/L (ref 39–117)
BUN: 16 mg/dL (ref 6–23)
CO2: 29 meq/L (ref 19–32)
Calcium: 9.3 mg/dL (ref 8.4–10.5)
Chloride: 96 meq/L (ref 96–112)
Creatinine, Ser: 0.67 mg/dL (ref 0.40–1.20)
GFR: 84.3 mL/min (ref >60.00)
Glucose, Bld: 95 mg/dL (ref 70–99)
Potassium: 5.4 meq/L — ABNORMAL HIGH (ref 3.5–5.1)
Sodium: 132 meq/L — ABNORMAL LOW (ref 135–145)
Total Bilirubin: 0.5 mg/dL (ref 0.2–1.2)
Total Protein: 7.1 g/dL (ref 6.0–8.3)

## 2023-08-11 LAB — CBC WITH DIFFERENTIAL/PLATELET
Basophils Absolute: 0.1 10*3/uL (ref 0.0–0.1)
Basophils Relative: 1.1 % (ref 0.0–3.0)
Eosinophils Absolute: 0.1 10*3/uL (ref 0.0–0.7)
Eosinophils Relative: 1.5 % (ref 0.0–5.0)
HCT: 43 % (ref 36.0–46.0)
Hemoglobin: 14.5 g/dL (ref 12.0–15.0)
Lymphocytes Relative: 25.9 % (ref 12.0–46.0)
Lymphs Abs: 2.3 10*3/uL (ref 0.7–4.0)
MCHC: 33.7 g/dL (ref 30.0–36.0)
MCV: 92.5 fl (ref 78.0–100.0)
Monocytes Absolute: 1.2 10*3/uL — ABNORMAL HIGH (ref 0.1–1.0)
Monocytes Relative: 14.4 % — ABNORMAL HIGH (ref 3.0–12.0)
Neutro Abs: 5 10*3/uL (ref 1.4–7.7)
Neutrophils Relative %: 57.1 % (ref 43.0–77.0)
Platelets: 426 10*3/uL — ABNORMAL HIGH (ref 150.0–400.0)
RBC: 4.64 Mil/uL (ref 3.87–5.11)
RDW: 13.5 % (ref 11.5–15.5)
WBC: 8.7 10*3/uL (ref 4.0–10.5)

## 2023-08-11 LAB — VITAMIN D 25 HYDROXY (VIT D DEFICIENCY, FRACTURES): VITD: 57.45 ng/mL (ref 30.00–100.00)

## 2023-08-11 LAB — TSH: TSH: 1.57 u[IU]/mL (ref 0.35–5.50)

## 2023-08-11 LAB — VITAMIN B12: Vitamin B-12: 1428 pg/mL — ABNORMAL HIGH (ref 211–911)

## 2023-08-11 MED ORDER — CITALOPRAM HYDROBROMIDE 10 MG PO TABS: ORAL_TABLET | ORAL | 1 refills | Status: DC

## 2023-08-11 NOTE — Assessment & Plan Note (Signed)
 Encourage heart healthy diet such as MIND or DASH diet, increase exercise, avoid trans fats, simple carbohydrates and processed foods, consider a krill or fish or flaxseed oil cap daily.

## 2023-08-11 NOTE — Assessment & Plan Note (Signed)
 Well controlled, no changes to meds. Encouraged heart healthy diet such as the DASH diet and exercise as tolerated.

## 2023-08-11 NOTE — Progress Notes (Unsigned)
 Established Patient Office Visit  Subjective   Patient ID: Kristen Schultz, female    DOB: Apr 12, 1945  Age: 78 y.o. MRN: [id number]  Chief Complaint  Patient presents with   Urine color    Pt states urine color appears darker.     HPI Discussed the use of AI scribe software for clinical note transcription with the patient, who gave verbal consent to proceed.  History of Present Illness Kristen Schultz is a 78 year old female who presents with excessive sleepiness and fatigue. She is accompanied by her husband, Hinton Luis.  She has been experiencing excessive sleepiness, sleeping 18 to 22 hours a day for the past two to three weeks. She attributes this to stress and exhaustion from caring for a difficult puppy, which she has since placed in foster care. She describes feeling 'exhausted' and notes that the sleep has been a 'godsend' for her recovery.  There was a recent change in her medication, where her Celexa  dose was adjusted from 10 mg to 15 mg. She feels this adjustment has helped alleviate some of her symptoms, including persistent ear pain and electrical noises in her ear. She still experiences some inner ear pain but notes improvement.  She has noticed darker urine recently, which her husband attributes to not drinking enough water. No burning, frequency, or odor associated with urination, but she mentions wearing a pad due to incontinence issues.  She experienced a recent episode of right ankle swelling, which was evaluated by a chiropractor. The swelling has since improved, and she has never experienced edema before. She also mentions a recent successful bowel movement following a chiropractic adjustment.  She has been experiencing general fatigue, achiness, and difficulty keeping up with her daily activities, which she attributes to the stress of caring for the dog and her husband's health issues. No chest pain, shortness of breath, or hypoxia.   Patient Active Problem  List   Diagnosis Date Noted   Other fatigue 06/29/2022   Osteopenia 06/29/2022   Adult acne 01/18/2022   RUQ pain 06/11/2021   Skin rash 12/31/2020   Dysuria 12/31/2020   Hyperlipidemia 12/15/2020   B12 deficiency 12/10/2019   Palpitation 12/10/2019   Polyp of colon 12/10/2019   Adjustment disorder with mixed anxiety and depressed mood 12/10/2019   Bradycardia 12/10/2019   Elevated amylase 05/24/2018   Preventative health care 05/24/2018   Elevated rheumatoid factor 05/24/2018   Generalized anxiety disorder 11/11/2014   Essential hypertension 11/11/2014   Past Medical History:  Diagnosis Date   Anxiety    Cellulitis of chin 11/17/2020   Colon polyps    COVID-19 11/17/2020   Gastric ulcer    per patient; in the 1970s   GERD (gastroesophageal reflux disease)    H. pylori infection    Heart murmur    Hyperlipidemia    no treatment per pt   Hypertension    Liver cyst    Ovarian cyst 1984   Pneumonia    Past Surgical History:  Procedure Laterality Date   COLONOSCOPY  02/10/2016   DENTAL SURGERY  2000   dental implant   DENTAL SURGERY  2021   2 molars taken out    ESOPHAGOGASTRODUODENOSCOPY  11/06/2018   [facility]    ESOPHAGOGASTRODUODENOSCOPY  07/23/2019   [facility]    EUS  07/23/2019   [facility]    HEMORRHOID SURGERY     LAPAROSCOPIC LIVER CYST REMOVAL     OVARIAN CYST SURGERY  Social History   Tobacco Use   Smoking status: Former    Current packs/day: 0.00    Average packs/day: 0.3 packs/day for 15.0 years (4.5 ttl pk-yrs)    Types: Cigarettes    Start date: 11/18/1970    Quit date: 11/17/1985    Years since quitting: 37.7   Smokeless tobacco: Never  Vaping Use   Vaping status: Never Used  Substance Use Topics   Alcohol use: Yes    Alcohol/week: 14.0 standard drinks of alcohol    Types: 14 Glasses of wine per week    Comment: 2 glasses per night   Drug use: No   Social History   Socioeconomic History   Marital  status: Married    Spouse name: Tom   Number of children: 1   Years of education: Not on file   Highest education level: 12th grade  Occupational History   Occupation: retired    Comment: retired  Tobacco Use   Smoking status: Former    Current packs/day: 0.00    Average packs/day: 0.3 packs/day for 15.0 years (4.5 ttl pk-yrs)    Types: Cigarettes    Start date: 11/18/1970    Quit date: 11/17/1985    Years since quitting: 37.7   Smokeless tobacco: Never  Vaping Use   Vaping status: Never Used  Substance and Sexual Activity   Alcohol use: Yes    Alcohol/week: 14.0 standard drinks of alcohol    Types: 14 Glasses of wine per week    Comment: 2 glasses per night   Drug use: No   Sexual activity: Not Currently    Partners: Male  Other Topics Concern   Not on file  Social History Narrative   Exercise- Walks about 15-22 miles per week with womens group.   Social Drivers of Corporate investment banker Strain: Low Risk  (04/05/2023)   Overall Financial Resource Strain (CARDIA)    Difficulty of Paying Living Expenses: Not hard at all  Food Insecurity: No Food Insecurity (04/05/2023)   Hunger Vital Sign    Worried About Running Out of Food in the Last Year: Never true    Ran Out of Food in the Last Year: Never true  Transportation Needs: No Transportation Needs (04/05/2023)   PRAPARE - Administrator, Civil Service (Medical): No    Lack of Transportation (Non-Medical): No  Physical Activity: Inactive (04/05/2023)   Exercise Vital Sign    Days of Exercise per Week: 0 days    Minutes of Exercise per Session: 0 min  Stress: No Stress Concern Present (04/05/2023)   [employer] of Occupational Health - Occupational Stress Questionnaire    Feeling of Stress : Not at all  Social Connections: Socially Integrated (04/05/2023)   Social Connection and Isolation Panel [NHANES]    Frequency of Communication with Friends and Family: More than three times a week    Frequency of  Social Gatherings with Friends and Family: More than three times a week    Attends Religious Services: More than 4 times per year    Active Member of [employer] or Organizations: Yes    Attends Banker Meetings: More than 4 times per year    Marital Status: Married  Catering manager Violence: Not At Risk (04/05/2023)   Humiliation, Afraid, Rape, and Kick questionnaire    Fear of Current or Ex-Partner: No    Emotionally Abused: No    Physically Abused: No    Sexually Abused: No  Family Status  Relation Name Status   Mother  Deceased at age 39       leukemia   Father  Deceased at age 74       heart attack    Sister  Deceased   Brother Kendra Pavy Deceased at age 80       heart attack   Brother RC Alive   Neg Hx  (Not Specified)  No partnership data on file   Family History  Problem Relation Age of Onset   Heart disease Father    Heart failure Sister    Heart disease Sister    Dementia Sister    Heart disease Brother 57   Heart disease Brother    Alzheimer's disease Brother    Dementia Brother    Colon cancer Neg Hx    Esophageal cancer Neg Hx    Pancreatic cancer Neg Hx    Stomach cancer Neg Hx    Rectal cancer Neg Hx    BRCA 1/2 Neg Hx    Breast cancer Neg Hx    Allergies  Allergen Reactions   Bee Venom Anaphylaxis    Bee stings   Codeine     Extreme Vomiting   Other Other (See Comments)    CDN - reaction unknown   Penicillins     Redness, swelling and itching Can take Keflex    Tetracyclines & Related     Yeast Infection and Stomach problems   Xylocaine Dental [Lidocaine-Epinephrine (Pf)]       Review of Systems  Constitutional:  Positive for malaise/fatigue. Negative for chills and fever.  HENT:  Negative for congestion and hearing loss.   Eyes:  Negative for blurred vision and discharge.  Respiratory:  Negative for cough, sputum production and shortness of breath.   Cardiovascular:  Negative for chest pain, palpitations and leg swelling.   Gastrointestinal:  Negative for abdominal pain, blood in stool, constipation, diarrhea, heartburn, nausea and vomiting.  Genitourinary:  Negative for dysuria, frequency, hematuria and urgency.  Musculoskeletal:  Negative for back pain, falls and myalgias.  Skin:  Negative for rash.  Neurological:  Negative for dizziness, sensory change, loss of consciousness, weakness and headaches.  Endo/Heme/Allergies:  Negative for environmental allergies. Does not bruise/bleed easily.  Psychiatric/Behavioral:  Negative for depression and suicidal ideas. The patient is not nervous/anxious and does not have insomnia.       Objective:     BP 120/84 (BP Location: Left Arm, Patient Position: Sitting, Cuff Size: Normal)   Pulse 70   Temp 97.8 F (36.6 C) (Oral)   Resp 20   Ht 5\' 5"  (1.651 m)   Wt 142 lb 3.2 oz (64.5 kg)   SpO2 100%   BMI 23.66 kg/m  BP Readings from Last 3 Encounters:  08/11/23 120/84  08/03/23 126/86  07/15/23 122/82   Wt Readings from Last 3 Encounters:  08/11/23 142 lb 3.2 oz (64.5 kg)  08/03/23 146 lb 3.2 oz (66.3 kg)  07/15/23 146 lb 9.6 oz (66.5 kg)   SpO2 Readings from Last 3 Encounters:  08/11/23 100%  08/03/23 97%  07/15/23 97%      Physical Exam Vitals and nursing note reviewed.  Constitutional:      General: She is not in acute distress.    Appearance: Normal appearance. She is well-developed.  HENT:     Head: Normocephalic and atraumatic.  Eyes:     General: No scleral icterus.       Right eye: No discharge.  Left eye: No discharge.  Cardiovascular:     Rate and Rhythm: Normal rate and regular rhythm.     Heart sounds: No murmur heard. Pulmonary:     Effort: Pulmonary effort is normal. No respiratory distress.     Breath sounds: Normal breath sounds.  Musculoskeletal:        General: Normal range of motion.     Cervical back: Normal range of motion and neck supple.     Right lower leg: No edema.     Left lower leg: No edema.  Skin:     General: Skin is warm and dry.  Neurological:     Mental Status: She is alert and oriented to person, place, and time.  Psychiatric:        Mood and Affect: Mood normal.        Behavior: Behavior normal.        Thought Content: Thought content normal.        Judgment: Judgment normal.      No results found for any visits on 08/11/23.  Last CBC Lab Results  Component Value Date   WBC 6.2 07/15/2023   HGB 14.5 07/15/2023   HCT 43.0 07/15/2023   MCV 96.5 07/15/2023   MCH 31.8 08/14/2022   RDW 13.5 07/15/2023   PLT 250.0 07/15/2023   Last metabolic panel Lab Results  Component Value Date   GLUCOSE 89 07/15/2023   NA 137 07/15/2023   K 4.8 07/15/2023   CL 99 07/15/2023   CO2 31 07/15/2023   BUN 17 07/15/2023   CREATININE 0.74 07/15/2023   GFR 78.08 07/15/2023   CALCIUM  9.7 07/15/2023   PROT 7.3 07/15/2023   ALBUMIN 4.3 07/15/2023   LABGLOB 2.3 06/29/2018   AGRATIO 1.8 06/29/2018   BILITOT 0.8 07/15/2023   ALKPHOS 60 07/15/2023   AST 28 07/15/2023   ALT 16 07/15/2023   ANIONGAP 9 08/14/2022   Last lipids Lab Results  Component Value Date   CHOL 223 (H) 07/15/2023   HDL 100.00 07/15/2023   LDLCALC 113 (H) 07/15/2023   TRIG 52.0 07/15/2023   CHOLHDL 2 07/15/2023   Last hemoglobin A1c Lab Results  Component Value Date   HGBA1C 5.4 04/10/2020   Last thyroid  functions Lab Results  Component Value Date   TSH 2.06 07/15/2023   T4TOTAL 5.4 05/19/2015   Last vitamin D  Lab Results  Component Value Date   VD25OH 59.13 01/13/2023   Last vitamin B12 and Folate Lab Results  Component Value Date   VITAMINB12 393 07/15/2023      The 10-year ASCVD risk score (Arnett DK, et al., 2019) is: 24.2%    Assessment & Plan:   Problem List Items Addressed This Visit       Unprioritized   Generalized anxiety disorder   Relevant Medications   citalopram  (CELEXA ) 10 MG tablet   Other Relevant Orders   CBC with Differential/Platelet   Comprehensive metabolic  panel with GFR   Lipid panel   TSH   Vitamin B12   VITAMIN D  25 Hydroxy (Vit-D Deficiency, Fractures)   B12 deficiency   Relevant Orders   Vitamin B12   Hyperlipidemia   Encourage heart healthy diet such as MIND or DASH diet, increase exercise, avoid trans fats, simple carbohydrates and processed foods, consider a krill or fish or flaxseed oil cap daily.        Relevant Orders   CBC with Differential/Platelet   Comprehensive metabolic panel with GFR   Lipid panel  TSH   Essential hypertension   Well controlled, no changes to meds. Encouraged heart healthy diet such as the DASH diet and exercise as tolerated.        Relevant Orders   CBC with Differential/Platelet   Comprehensive metabolic panel with GFR   Lipid panel   TSH   Vitamin B12   VITAMIN D  25 Hydroxy (Vit-D Deficiency, Fractures)   Other Visit Diagnoses       Tinnitus, right ear    -  Primary   Relevant Orders   TSH   Vitamin B12   VITAMIN D  25 Hydroxy (Vit-D Deficiency, Fractures)     Abnormal urine       Relevant Orders   POCT Urinalysis Dipstick (Automated)     Assessment and Plan Assessment & Plan Sleep disturbances   She has been experiencing excessive sleep, sleeping 18-22 hours daily for the past few weeks. This is likely due to stress and exhaustion from managing a difficult dog and additional household responsibilities because of her husband's back issues.  Edema of right ankle   Right ankle swelling has improved after chiropractic adjustment. The chiropractor's differential diagnosis included pulmonary embolism, CHF, kidney or liver dysfunction, anemia, hypoxia, and thyroid  dysfunction. Recent labs in April were normal, including thyroid , kidney, and liver function tests. She has no current symptoms of PE or CHF. Repeat blood work to ensure all parameters are still normal.  \  Hearing improvement with medication adjustment   Her hearing has improved after increasing Celexa  dosage from 10 mg to  15 mg. Inner ear pain persists without electrical noises. Previous fluid in the ear was noted by PA.  Incontinence   She experiences mild urinary incontinence, requiring use of a pad. There is no burning, frequency, or odor. Possible dehydration due to reduced fluid intake to manage incontinence. Check urine specimen to rule out other causes.    Return if symptoms worsen or fail to improve.    Sheryl Towell R Lowne Chase, DO

## 2023-08-11 NOTE — Patient Instructions (Signed)
 Major Depressive Disorder, Adult Major depressive disorder (MDD) is a mental health condition. People with this disorder feel very sad, hopeless, and lose interest in things. Symptoms last most of the day, almost every day, for 2 weeks. MDD can affect: Relationships. Work and school. Things you usually like to do. What are the causes? The cause of MDD is not known. What increases the risk? Having family members with depression. Being female. Family problems. Alcohol or drug misuse. A lot of stress in your life, such as from: Living without basic needs such as food and housing. Being treated poorly because of race, sex, or religion (discrimination). Things that caused you pain as a child, especially if you lost a parent or were abused. Health and mental problems that you have had for a long time. What are the signs or symptoms? The main symptoms of this condition are: Being sad all the time. Being grouchy (irritable) all the time. Not enjoying the things you usually like. Sleeping too much or too little. Eating too much or too little. Feeling tired. Other symptoms include: Gaining or losing weight, without knowing why. Being restless and weak. Feeling hopeless, worthless, or guilty. Trouble thinking or making decisions. Thoughts of hurting yourself or others, or thoughts of ending your life. Spending a lot of time alone. Being unable to do daily tasks. If you have very bad MDD, you may: Believe things that are not true. Hear, see, taste, or feel things that are not there. Have mild depression that lasts for at least 2 years. Feel very sad and hopeless. Have trouble speaking or moving. Feel very sad during some seasons. How is this treated? Talk therapy. This teaches you about thoughts, feelings, and actions and how to change them. This can also help you to talk with others. This can be done with members of your family. Medicines. Lifestyle changes. You may need to: Limit  alcohol use. Stop using drugs, if you use them. Exercise. Get plenty of sleep. Eat healthy. Spend more time outdoors. Brain stimulation. This may be done when symptoms are very bad or have not gotten better. Follow these instructions at home: Alcohol use Do not drink alcohol if: Your health care provider tells you not to drink. You are pregnant, may be pregnant, or are planning to become pregnant. If you drink alcohol: Limit how much you use to: 0-1 drink a day for women. 0-2 drinks a day for men. Know how much alcohol is in your drink. In the [place] one drink equals one 12 oz bottle of beer (355 mL), one 5 oz glass of wine (148 mL), or one 1 oz glass of hard liquor (44 mL). Activity Exercise as told by your doctor. Spend time outdoors. Make time to do the things you enjoy. Find ways to deal with stress. Try to: Meditate. Do deep breathing. Spend time in nature. Keep a journal. Return to your normal activities when your doctor says that it is safe. General instructions  Take over-the-counter and prescription medicines only as told by your doctor. Talk to your doctor about: Alcohol use. It can affect medicines. Any drug use. Eat healthy foods. Get a lot of sleep. Think about joining a support group. Ask your doctor about that. Keep all follow-up visits. Your doctor will need to check on your mood, behavior, and medicines, and change your treatment as needed. Where to find more information: [employer] on Mental Illness: nami[employer] of Mental Health: BloggerCourse.com American Psychiatric Association: psychiatry.org Contact a doctor  if: You feel worse. You get new symptoms. Get help right away if: You hurt yourself on purpose (self-harm). You have thoughts about hurting yourself or others. You see, hear, taste, smell, or feel things that are not there. Get help right away if you feel like you may hurt yourself or others, or have thoughts about taking  your own life. Go to your nearest emergency room or: Call 911. Call the National Suicide Prevention Lifeline at (513) 004-4926 or 988. This is open 24 hours a day. Text the Crisis Text Line at (314) 815-6587. This information is not intended to replace advice given to you by your health care provider. Make sure you discuss any questions you have with your health care provider. Document Revised: 07/14/2021 Document Reviewed: 07/14/2021 Elsevier Patient Education  2024 [employer].

## 2023-08-12 ENCOUNTER — Telehealth: Payer: Self-pay

## 2023-08-12 ENCOUNTER — Other Ambulatory Visit: Payer: Self-pay | Admitting: Family Medicine

## 2023-08-12 ENCOUNTER — Ambulatory Visit: Payer: Self-pay | Admitting: Family Medicine

## 2023-08-12 DIAGNOSIS — R748 Abnormal levels of other serum enzymes: Secondary | ICD-10-CM

## 2023-08-12 NOTE — Telephone Encounter (Signed)
 Copied from CRM [id number]. Topic: Clinical - Lab/Test Results >> Aug 12, 2023  4:03 PM Laquanda P wrote: Reason for CRM: PT called to go over lab results- advise PCP has not reviewed results as of yet but once she does she will relay the response on mychart.

## 2023-08-17 NOTE — Telephone Encounter (Signed)
 Liver function elevated and potassium is high-----  repeat next week Any etoh, tylenol or other otc meds?    Written by Estill Hemming, DO on 08/12/2023  5:43 PM EDT Seen by patient Kristen Schultz on 08/16/2023  9:02 AM

## 2023-08-18 DIAGNOSIS — M9901 Segmental and somatic dysfunction of cervical region: Secondary | ICD-10-CM | POA: Diagnosis not present

## 2023-08-18 DIAGNOSIS — M5032 Other cervical disc degeneration, mid-cervical region, unspecified level: Secondary | ICD-10-CM | POA: Diagnosis not present

## 2023-08-18 DIAGNOSIS — M5134 Other intervertebral disc degeneration, thoracic region: Secondary | ICD-10-CM | POA: Diagnosis not present

## 2023-08-18 DIAGNOSIS — M545 Low back pain, unspecified: Secondary | ICD-10-CM | POA: Diagnosis not present

## 2023-08-19 ENCOUNTER — Other Ambulatory Visit (INDEPENDENT_AMBULATORY_CARE_PROVIDER_SITE_OTHER)

## 2023-08-19 DIAGNOSIS — R748 Abnormal levels of other serum enzymes: Secondary | ICD-10-CM | POA: Diagnosis not present

## 2023-08-19 LAB — COMPREHENSIVE METABOLIC PANEL WITH GFR
ALT: 28 U/L (ref 0–35)
AST: 25 U/L (ref 0–37)
Albumin: 3.8 g/dL (ref 3.5–5.2)
Alkaline Phosphatase: 62 U/L (ref 39–117)
BUN: 18 mg/dL (ref 6–23)
CO2: 27 meq/L (ref 19–32)
Calcium: 9.4 mg/dL (ref 8.4–10.5)
Chloride: 99 meq/L (ref 96–112)
Creatinine, Ser: 0.69 mg/dL (ref 0.40–1.20)
GFR: 83.7 mL/min (ref >60.00)
Glucose, Bld: 109 mg/dL — ABNORMAL HIGH (ref 70–99)
Potassium: 5 meq/L (ref 3.5–5.1)
Sodium: 135 meq/L (ref 135–145)
Total Bilirubin: 0.6 mg/dL (ref 0.2–1.2)
Total Protein: 6.7 g/dL (ref 6.0–8.3)

## 2023-08-24 ENCOUNTER — Ambulatory Visit: Payer: Self-pay | Admitting: Family Medicine

## 2023-08-25 DIAGNOSIS — M9901 Segmental and somatic dysfunction of cervical region: Secondary | ICD-10-CM | POA: Diagnosis not present

## 2023-08-25 DIAGNOSIS — M5032 Other cervical disc degeneration, mid-cervical region, unspecified level: Secondary | ICD-10-CM | POA: Diagnosis not present

## 2023-08-25 DIAGNOSIS — M545 Low back pain, unspecified: Secondary | ICD-10-CM | POA: Diagnosis not present

## 2023-08-25 DIAGNOSIS — M5134 Other intervertebral disc degeneration, thoracic region: Secondary | ICD-10-CM | POA: Diagnosis not present

## 2023-09-01 DIAGNOSIS — M5134 Other intervertebral disc degeneration, thoracic region: Secondary | ICD-10-CM | POA: Diagnosis not present

## 2023-09-01 DIAGNOSIS — M9901 Segmental and somatic dysfunction of cervical region: Secondary | ICD-10-CM | POA: Diagnosis not present

## 2023-09-01 DIAGNOSIS — M5032 Other cervical disc degeneration, mid-cervical region, unspecified level: Secondary | ICD-10-CM | POA: Diagnosis not present

## 2023-09-01 DIAGNOSIS — M545 Low back pain, unspecified: Secondary | ICD-10-CM | POA: Diagnosis not present

## 2023-09-12 DIAGNOSIS — M545 Low back pain, unspecified: Secondary | ICD-10-CM | POA: Diagnosis not present

## 2023-09-12 DIAGNOSIS — M9901 Segmental and somatic dysfunction of cervical region: Secondary | ICD-10-CM | POA: Diagnosis not present

## 2023-09-12 DIAGNOSIS — M5032 Other cervical disc degeneration, mid-cervical region, unspecified level: Secondary | ICD-10-CM | POA: Diagnosis not present

## 2023-09-12 DIAGNOSIS — M5134 Other intervertebral disc degeneration, thoracic region: Secondary | ICD-10-CM | POA: Diagnosis not present

## 2023-09-28 DIAGNOSIS — M5134 Other intervertebral disc degeneration, thoracic region: Secondary | ICD-10-CM | POA: Diagnosis not present

## 2023-09-28 DIAGNOSIS — M9901 Segmental and somatic dysfunction of cervical region: Secondary | ICD-10-CM | POA: Diagnosis not present

## 2023-09-28 DIAGNOSIS — M545 Low back pain, unspecified: Secondary | ICD-10-CM | POA: Diagnosis not present

## 2023-09-28 DIAGNOSIS — M5032 Other cervical disc degeneration, mid-cervical region, unspecified level: Secondary | ICD-10-CM | POA: Diagnosis not present

## 2023-10-12 DIAGNOSIS — M545 Low back pain, unspecified: Secondary | ICD-10-CM | POA: Diagnosis not present

## 2023-10-12 DIAGNOSIS — M9901 Segmental and somatic dysfunction of cervical region: Secondary | ICD-10-CM | POA: Diagnosis not present

## 2023-10-12 DIAGNOSIS — M5032 Other cervical disc degeneration, mid-cervical region, unspecified level: Secondary | ICD-10-CM | POA: Diagnosis not present

## 2023-10-12 DIAGNOSIS — M5134 Other intervertebral disc degeneration, thoracic region: Secondary | ICD-10-CM | POA: Diagnosis not present

## 2023-10-26 DIAGNOSIS — M545 Low back pain, unspecified: Secondary | ICD-10-CM | POA: Diagnosis not present

## 2023-10-26 DIAGNOSIS — M5134 Other intervertebral disc degeneration, thoracic region: Secondary | ICD-10-CM | POA: Diagnosis not present

## 2023-10-26 DIAGNOSIS — M9901 Segmental and somatic dysfunction of cervical region: Secondary | ICD-10-CM | POA: Diagnosis not present

## 2023-10-26 DIAGNOSIS — M5032 Other cervical disc degeneration, mid-cervical region, unspecified level: Secondary | ICD-10-CM | POA: Diagnosis not present

## 2023-11-09 DIAGNOSIS — M9901 Segmental and somatic dysfunction of cervical region: Secondary | ICD-10-CM | POA: Diagnosis not present

## 2023-11-09 DIAGNOSIS — M5134 Other intervertebral disc degeneration, thoracic region: Secondary | ICD-10-CM | POA: Diagnosis not present

## 2023-11-09 DIAGNOSIS — M545 Low back pain, unspecified: Secondary | ICD-10-CM | POA: Diagnosis not present

## 2023-11-09 DIAGNOSIS — M5032 Other cervical disc degeneration, mid-cervical region, unspecified level: Secondary | ICD-10-CM | POA: Diagnosis not present

## 2023-11-23 DIAGNOSIS — M545 Low back pain, unspecified: Secondary | ICD-10-CM | POA: Diagnosis not present

## 2023-11-23 DIAGNOSIS — M5032 Other cervical disc degeneration, mid-cervical region, unspecified level: Secondary | ICD-10-CM | POA: Diagnosis not present

## 2023-11-23 DIAGNOSIS — M5134 Other intervertebral disc degeneration, thoracic region: Secondary | ICD-10-CM | POA: Diagnosis not present

## 2023-11-23 DIAGNOSIS — M9901 Segmental and somatic dysfunction of cervical region: Secondary | ICD-10-CM | POA: Diagnosis not present

## 2023-12-07 DIAGNOSIS — M5032 Other cervical disc degeneration, mid-cervical region, unspecified level: Secondary | ICD-10-CM | POA: Diagnosis not present

## 2023-12-07 DIAGNOSIS — M545 Low back pain, unspecified: Secondary | ICD-10-CM | POA: Diagnosis not present

## 2023-12-07 DIAGNOSIS — M5134 Other intervertebral disc degeneration, thoracic region: Secondary | ICD-10-CM | POA: Diagnosis not present

## 2023-12-07 DIAGNOSIS — M9901 Segmental and somatic dysfunction of cervical region: Secondary | ICD-10-CM | POA: Diagnosis not present

## 2023-12-08 ENCOUNTER — Ambulatory Visit: Payer: Self-pay

## 2023-12-08 NOTE — Telephone Encounter (Signed)
 FYI Only or Action Required?: FYI only for provider.  Patient was last seen in primary care on 08/11/2023 by Antonio Meth, Jamee SAUNDERS, DO.  Called Nurse Triage reporting Abdominal Pain and Neck Pain.  Symptoms began several months ago.  Interventions attempted: Rest, hydration, or home remedies.  Symptoms are: unchanged.  Triage Disposition: See PCP Within 2 Weeks  Patient/caregiver understands and will follow disposition?: Yes        Copied from CRM [id number]. Topic: Clinical - Red Word Triage >> Dec 08, 2023  9:52 AM Rea ORN wrote: Red Word that prompted transfer to Nurse Triage: increased stomach pain, neck tightness Reason for Disposition  Abdominal pain is a chronic symptom (recurrent or ongoing AND present > 4 weeks)  Answer Assessment - Initial Assessment Questions 1. LOCATION: Where does it hurt?      R abd pain where ovary is L and middle just under ribcage - it feels like it buldges when I lift something 2. RADIATION: Does the pain shoot anywhere else? (e.g., chest, back)     Not exactly 3. ONSET: When did the pain begin? (e.g., minutes, hours or days ago)      A while - since Christmas About 6 mos 4. SUDDEN: Gradual or sudden onset?     gradual 5. PATTERN Does the pain come and go, or is it constant?     intermittent 6. SEVERITY: How bad is the pain?  (e.g., Scale 1-10; mild, moderate, or severe)     3/10 7. RECURRENT SYMPTOM: Have you ever had this type of stomach pain before? If Yes, ask: When was the last time? and What happened that time?      *No Answer* 8. CAUSE: What do you think is causing the stomach pain? (e.g., gallstones, recent abdominal surgery)     Pt thinks a hernia 9. RELIEVING/AGGRAVATING FACTORS: What makes it better or worse? (e.g., antacids, bending or twisting motion, bowel movement)     *No Answer* 10. OTHER SYMPTOMS: Do you have any other symptoms? (e.g., back pain, diarrhea, fever, urination pain,  vomiting)       Neck pain, leg/ankle cramps, fatigue 11. PREGNANCY: Is there any chance you are pregnant? When was your last menstrual period?       N/a  Protocols used: Abdominal Pain - Female-A-AH

## 2023-12-08 NOTE — Telephone Encounter (Signed)
 Appt scheduled

## 2023-12-12 ENCOUNTER — Ambulatory Visit (INDEPENDENT_AMBULATORY_CARE_PROVIDER_SITE_OTHER): Admitting: Family Medicine

## 2023-12-12 ENCOUNTER — Encounter: Payer: Self-pay | Admitting: Family Medicine

## 2023-12-12 VITALS — BP 142/70 | HR 70 | Temp 98.1°F | Resp 16 | Ht 65.0 in | Wt 148.8 lb

## 2023-12-12 DIAGNOSIS — R748 Abnormal levels of other serum enzymes: Secondary | ICD-10-CM

## 2023-12-12 DIAGNOSIS — R252 Cramp and spasm: Secondary | ICD-10-CM

## 2023-12-12 DIAGNOSIS — R5383 Other fatigue: Secondary | ICD-10-CM

## 2023-12-12 NOTE — Progress Notes (Signed)
 -              Subjective:    Patient ID: Kristen Schultz, female    DOB: [birth year], 78 y.o.   MRN: [id number]  Chief Complaint  Patient presents with   Abdominal Pain    Pain going on for several months but states having an episode over the weekend that may have cleared up the pain.     HPI Patient is in today for f/u abd pain.  Discussed the use of AI scribe software for clinical note transcription with the patient, who gave verbal consent to proceed.  History of Present Illness Kristen Schultz is a 78 year old female who presents with persistent fatigue and abdominal discomfort.  She has been experiencing persistent fatigue for several months, feeling extremely tired and needing to rest after lunch despite adequate nighttime sleep. She consumes three glasses of wine nightly and drinks coffee in the morning, which may lead to dehydration. An herbalist suggested increasing water and magnesium intake, but her adherence has been inconsistent. She also experiences muscle cramps.  She describes her abdominal discomfort as a sensation of bloating and pain upon palpation on the right side, which she associates with her right ovary. She had a benign cyst removed from the ovary approximately thirty years ago. An ultrasound in 2022 did not visualize the ovaries.  She has a history of high blood pressure and is currently taking 50 mg of losartan  and 15 mg of citalopram . She recently forgot to take her medications for two days but resumed them on Sunday. She questions the effectiveness of losartan  for her blood pressure control and reports experiencing 'zings' in her head when missing doses of citalopram .  Her social history includes significant emotional stressors, such as her husband's back injury and her mother's caregiving responsibilities for a sister with stage four ovarian cancer. She is involved in social activities and groups, which have been affected by her current emotional  and physical state.    Past Medical History:  Diagnosis Date   Anxiety    Cellulitis of chin 11/17/2020   Colon polyps    COVID-19 11/17/2020   Gastric ulcer    per patient; in the 1970s   GERD (gastroesophageal reflux disease)    H. pylori infection    Heart murmur    Hyperlipidemia    no treatment per pt   Hypertension    Liver cyst    Ovarian cyst 1984   Pneumonia     Past Surgical History:  Procedure Laterality Date   COLONOSCOPY  02/10/2016   DENTAL SURGERY  2000   dental implant   DENTAL SURGERY  2021   2 molars taken out    ESOPHAGOGASTRODUODENOSCOPY  11/06/2018   [facility]    ESOPHAGOGASTRODUODENOSCOPY  07/23/2019   [facility]    EUS  07/23/2019   [facility]    HEMORRHOID SURGERY     LAPAROSCOPIC LIVER CYST REMOVAL     OVARIAN CYST SURGERY      Family History  Problem Relation Age of Onset   Heart disease Father    Heart failure Sister    Heart disease Sister    Dementia Sister    Heart disease Brother 51   Heart disease Brother    Alzheimer's disease Brother    Dementia Brother    Colon cancer Neg Hx    Esophageal cancer Neg Hx    Pancreatic cancer Neg Hx    Stomach  cancer Neg Hx    Rectal cancer Neg Hx    BRCA 1/2 Neg Hx    Breast cancer Neg Hx     Social History   Socioeconomic History   Marital status: Married    Spouse name: Tom   Number of children: 1   Years of education: Not on file   Highest education level: 12th grade  Occupational History   Occupation: retired    Comment: retired  Tobacco Use   Smoking status: Former    Current packs/day: 0.00    Average packs/day: 0.3 packs/day for 15.0 years (4.5 ttl pk-yrs)    Types: Cigarettes    Start date: 11/18/1970    Quit date: 11/17/1985    Years since quitting: 38.0   Smokeless tobacco: Never  Vaping Use   Vaping status: Never Used  Substance and Sexual Activity   Alcohol use: Yes    Alcohol/week: 14.0 standard drinks of alcohol    Types: 14  Glasses of wine per week    Comment: 2 glasses per night   Drug use: No   Sexual activity: Not Currently    Partners: Male  Other Topics Concern   Not on file  Social History Narrative   Exercise- Walks about 15-22 miles per week with womens group.   Social Drivers of Corporate investment banker Strain: Low Risk  (12/10/2023)   Overall Financial Resource Strain (CARDIA)    Difficulty of Paying Living Expenses: Not hard at all  Food Insecurity: No Food Insecurity (12/10/2023)   Hunger Vital Sign    Worried About Running Out of Food in the Last Year: Never true    Ran Out of Food in the Last Year: Never true  Transportation Needs: No Transportation Needs (12/10/2023)   PRAPARE - Administrator, Civil Service (Medical): No    Lack of Transportation (Non-Medical): No  Physical Activity: Insufficiently Active (12/10/2023)   Exercise Vital Sign    Days of Exercise per Week: 2 days    Minutes of Exercise per Session: 30 min  Stress: No Stress Concern Present (12/10/2023)   [employer] of Occupational Health - Occupational Stress Questionnaire    Feeling of Stress: Only a little  Social Connections: Socially Integrated (12/10/2023)   Social Connection and Isolation Panel    Frequency of Communication with Friends and Family: Three times a week    Frequency of Social Gatherings with Friends and Family: Twice a week    Attends Religious Services: 1 to 4 times per year    Active Member of [employer] or Organizations: Yes    Attends Engineer, structural: More than 4 times per year    Marital Status: Married  Catering manager Violence: Not At Risk (04/05/2023)   Humiliation, Afraid, Rape, and Kick questionnaire    Fear of Current or Ex-Partner: No    Emotionally Abused: No    Physically Abused: No    Sexually Abused: No    Outpatient Medications Prior to Visit  Medication Sig Dispense Refill   citalopram  (CELEXA ) 10 MG tablet 1 1/2 po every day 135 tablet 1    losartan  (COZAAR ) 50 MG tablet Take 1 tablet (50 mg total) by mouth daily. 90 tablet 1   mupirocin  ointment (BACTROBAN ) 2 % Apply 1 Application topically 2 (two) times daily. 22 g 0   No facility-administered medications prior to visit.    Allergies  Allergen Reactions   Bee Venom Anaphylaxis    Bee stings  Codeine     Extreme Vomiting   Other Other (See Comments)    CDN - reaction unknown   Penicillins     Redness, swelling and itching Can take Keflex    Tetracyclines & Related     Yeast Infection and Stomach problems   Xylocaine Dental [Lidocaine-Epinephrine (Pf)]     Review of Systems  Constitutional:  Negative for chills, fever and malaise/fatigue.  HENT:  Negative for congestion and hearing loss.   Eyes:  Negative for blurred vision and discharge.  Respiratory:  Negative for cough, sputum production and shortness of breath.   Cardiovascular:  Negative for chest pain, palpitations and leg swelling.  Gastrointestinal:  Negative for abdominal pain, blood in stool, constipation, diarrhea, heartburn, nausea and vomiting.  Genitourinary:  Negative for dysuria, frequency, hematuria and urgency.  Musculoskeletal:  Negative for back pain, falls and myalgias.  Skin:  Negative for rash.  Neurological:  Negative for dizziness, sensory change, loss of consciousness, weakness and headaches.  Endo/Heme/Allergies:  Negative for environmental allergies. Does not bruise/bleed easily.  Psychiatric/Behavioral:  Negative for depression and suicidal ideas. The patient is not nervous/anxious and does not have insomnia.        Objective:    Physical Exam Vitals and nursing note reviewed.  Constitutional:      General: She is not in acute distress.    Appearance: Normal appearance. She is well-developed.  HENT:     Head: Normocephalic and atraumatic.  Eyes:     General: No scleral icterus.       Right eye: No discharge.        Left eye: No discharge.  Cardiovascular:     Rate and  Rhythm: Normal rate and regular rhythm.     Heart sounds: No murmur heard. Pulmonary:     Effort: Pulmonary effort is normal. No respiratory distress.     Breath sounds: Normal breath sounds.  Musculoskeletal:        General: Normal range of motion.     Cervical back: Normal range of motion and neck supple.     Right lower leg: No edema.     Left lower leg: No edema.  Skin:    General: Skin is warm and dry.  Neurological:     Mental Status: She is alert and oriented to person, place, and time.  Psychiatric:        Mood and Affect: Mood normal.        Behavior: Behavior normal.        Thought Content: Thought content normal.        Judgment: Judgment normal.     BP (!) 142/70 (BP Location: Left Arm, Patient Position: Sitting, Cuff Size: Normal)   Pulse 70   Temp 98.1 F (36.7 C) (Oral)   Resp 16   Ht 5' 5 (1.651 m)   Wt 148 lb 12.8 oz (67.5 kg)   SpO2 98%   BMI 24.76 kg/m  Wt Readings from Last 3 Encounters:  12/12/23 148 lb 12.8 oz (67.5 kg)  08/11/23 142 lb 3.2 oz (64.5 kg)  08/03/23 146 lb 3.2 oz (66.3 kg)    Diabetic Foot Exam - Simple   No data filed    Lab Results  Component Value Date   WBC 8.7 08/11/2023   HGB 14.5 08/11/2023   HCT 43.0 08/11/2023   PLT 426.0 (H) 08/11/2023   GLUCOSE 109 (H) 08/19/2023   CHOL 199 08/11/2023   TRIG 75.0 08/11/2023   HDL 65.60 08/11/2023  LDLCALC 118 (H) 08/11/2023   ALT 28 08/19/2023   AST 25 08/19/2023   NA 135 08/19/2023   K 5.0 08/19/2023   CL 99 08/19/2023   CREATININE 0.69 08/19/2023   BUN 18 08/19/2023   CO2 27 08/19/2023   TSH 1.57 08/11/2023   HGBA1C 5.4 04/10/2020    Lab Results  Component Value Date   TSH 1.57 08/11/2023   Lab Results  Component Value Date   WBC 8.7 08/11/2023   HGB 14.5 08/11/2023   HCT 43.0 08/11/2023   MCV 92.5 08/11/2023   PLT 426.0 (H) 08/11/2023   Lab Results  Component Value Date   NA 135 08/19/2023   K 5.0 08/19/2023   CO2 27 08/19/2023   GLUCOSE 109 (H)  08/19/2023   BUN 18 08/19/2023   CREATININE 0.69 08/19/2023   BILITOT 0.6 08/19/2023   ALKPHOS 62 08/19/2023   AST 25 08/19/2023   ALT 28 08/19/2023   PROT 6.7 08/19/2023   ALBUMIN 3.8 08/19/2023   CALCIUM  9.4 08/19/2023   ANIONGAP 9 08/14/2022   GFR 83.70 08/19/2023   Lab Results  Component Value Date   CHOL 199 08/11/2023   Lab Results  Component Value Date   HDL 65.60 08/11/2023   Lab Results  Component Value Date   LDLCALC 118 (H) 08/11/2023   Lab Results  Component Value Date   TRIG 75.0 08/11/2023   Lab Results  Component Value Date   CHOLHDL 3 08/11/2023   Lab Results  Component Value Date   HGBA1C 5.4 04/10/2020       Assessment & Plan:  Muscle cramps -     Magnesium  Other fatigue -     CBC with Differential/Platelet -     Comprehensive metabolic panel with GFR -     TSH -     Vitamin B12 -     VITAMIN D  25 Hydroxy (Vit-D Deficiency, Fractures) -     Magnesium  Elevated liver enzymes -     Amylase -     Lipase  Assessment and Plan Assessment & Plan Fatigue   Chronic fatigue may be influenced by emotional stress, alcohol consumption, and dehydration. A recent increase in activity due to a household incident may have temporarily improved her energy levels. Encourage increased hydration, discuss potential reduction in alcohol consumption, and encourage resumption of her exercise program.  Depression   Depression may be worsened by recent emotional stressors, including the loss of a pet and her husband's health issues. She is currently on citalopram  15 mg and desires a fun but quiet life, engaging in social activities and travel. Continue citalopram  15 mg daily, encourage engagement in social activities and hobbies, and consider counseling or therapy if symptoms persist.  Hypertension   Hypertension is currently treated with losartan  50 mg. Home blood pressure readings may be inaccurate due to wrist cuff use, and recent medication non-compliance is  noted. She questions the effectiveness of losartan  and reports high blood pressure readings. Continue losartan  50 mg daily, recommend obtaining an upper arm blood pressure cuff for accurate home monitoring, and check blood pressure in the office.  Elevated amylase   There is a history of elevated amylase levels, previously suspected to be related to hydrochlorothiazide use. The current status of amylase levels is unknown. Order blood work to check amylase levels.  Possible magnesium deficiency   A possible magnesium deficiency is suggested by a herbalist, with symptoms of muscle cramps. She is inconsistent with magnesium supplementation. Order  blood work to check magnesium levels and encourage consistent magnesium supplementation if a deficiency is confirmed.    Kailena Lubas R Lowne Chase, DO

## 2023-12-13 LAB — COMPREHENSIVE METABOLIC PANEL WITH GFR
ALT: 14 U/L (ref 0–35)
AST: 22 U/L (ref 0–37)
Albumin: 4.1 g/dL (ref 3.5–5.2)
Alkaline Phosphatase: 65 U/L (ref 39–117)
BUN: 16 mg/dL (ref 6–23)
CO2: 30 meq/L (ref 19–32)
Calcium: 9.8 mg/dL (ref 8.4–10.5)
Chloride: 99 meq/L (ref 96–112)
Creatinine, Ser: 1.03 mg/dL (ref 0.40–1.20)
GFR: 52.35 mL/min — ABNORMAL LOW (ref >60.00)
Glucose, Bld: 91 mg/dL (ref 70–99)
Potassium: 5.2 meq/L — ABNORMAL HIGH (ref 3.5–5.1)
Sodium: 137 meq/L (ref 135–145)
Total Bilirubin: 0.4 mg/dL (ref 0.2–1.2)
Total Protein: 7 g/dL (ref 6.0–8.3)

## 2023-12-13 LAB — LIPASE: Lipase: 21 U/L (ref 11.0–59.0)

## 2023-12-13 LAB — CBC WITH DIFFERENTIAL/PLATELET
Basophils Absolute: 0.1 K/uL (ref 0.0–0.1)
Basophils Relative: 1.1 % (ref 0.0–3.0)
Eosinophils Absolute: 0.1 K/uL (ref 0.0–0.7)
Eosinophils Relative: 1.8 % (ref 0.0–5.0)
HCT: 44.4 % (ref 36.0–46.0)
Hemoglobin: 14.7 g/dL (ref 12.0–15.0)
Lymphocytes Relative: 24.8 % (ref 12.0–46.0)
Lymphs Abs: 1.8 K/uL (ref 0.7–4.0)
MCHC: 33.1 g/dL (ref 30.0–36.0)
MCV: 95.7 fl (ref 78.0–100.0)
Monocytes Absolute: 0.8 K/uL (ref 0.1–1.0)
Monocytes Relative: 11 % (ref 3.0–12.0)
Neutro Abs: 4.5 K/uL (ref 1.4–7.7)
Neutrophils Relative %: 61.3 % (ref 43.0–77.0)
Platelets: 289 K/uL (ref 150.0–400.0)
RBC: 4.64 Mil/uL (ref 3.87–5.11)
RDW: 13.4 % (ref 11.5–15.5)
WBC: 7.4 K/uL (ref 4.0–10.5)

## 2023-12-13 LAB — TSH: TSH: 1.67 u[IU]/mL (ref 0.35–5.50)

## 2023-12-13 LAB — VITAMIN D 25 HYDROXY (VIT D DEFICIENCY, FRACTURES): VITD: 46.02 ng/mL (ref 30.00–100.00)

## 2023-12-13 LAB — MAGNESIUM: Magnesium: 2.1 mg/dL (ref 1.5–2.5)

## 2023-12-13 LAB — VITAMIN B12: Vitamin B-12: 345 pg/mL (ref 211–911)

## 2023-12-13 LAB — AMYLASE: Amylase: 320 U/L — ABNORMAL HIGH (ref 27–131)

## 2023-12-18 ENCOUNTER — Ambulatory Visit: Payer: Self-pay | Admitting: Family Medicine

## 2023-12-20 NOTE — Telephone Encounter (Signed)
 Copied from CRM [id number]. Topic: Clinical - Lab/Test Results >> Dec 20, 2023 12:25 PM Harlene ORN wrote: Reason for CRM: Patient is calling to ask about her lab results and what should her next steps be. Please call back the patient.

## 2023-12-20 NOTE — Telephone Encounter (Signed)
 Pt calling regarding labs from 12/12/23.

## 2023-12-21 DIAGNOSIS — M5134 Other intervertebral disc degeneration, thoracic region: Secondary | ICD-10-CM | POA: Diagnosis not present

## 2023-12-21 DIAGNOSIS — M9901 Segmental and somatic dysfunction of cervical region: Secondary | ICD-10-CM | POA: Diagnosis not present

## 2023-12-21 DIAGNOSIS — M545 Low back pain, unspecified: Secondary | ICD-10-CM | POA: Diagnosis not present

## 2023-12-21 DIAGNOSIS — M5032 Other cervical disc degeneration, mid-cervical region, unspecified level: Secondary | ICD-10-CM | POA: Diagnosis not present

## 2023-12-26 ENCOUNTER — Other Ambulatory Visit: Payer: Self-pay | Admitting: Family Medicine

## 2023-12-26 DIAGNOSIS — R748 Abnormal levels of other serum enzymes: Secondary | ICD-10-CM

## 2023-12-27 ENCOUNTER — Encounter: Payer: Self-pay | Admitting: Family Medicine

## 2023-12-27 ENCOUNTER — Other Ambulatory Visit: Payer: Self-pay | Admitting: Family Medicine

## 2023-12-27 DIAGNOSIS — R102 Pelvic and perineal pain unspecified side: Secondary | ICD-10-CM

## 2023-12-27 DIAGNOSIS — R748 Abnormal levels of other serum enzymes: Secondary | ICD-10-CM

## 2023-12-28 ENCOUNTER — Other Ambulatory Visit (INDEPENDENT_AMBULATORY_CARE_PROVIDER_SITE_OTHER)

## 2023-12-28 DIAGNOSIS — R748 Abnormal levels of other serum enzymes: Secondary | ICD-10-CM | POA: Diagnosis not present

## 2023-12-29 ENCOUNTER — Ambulatory Visit (HOSPITAL_BASED_OUTPATIENT_CLINIC_OR_DEPARTMENT_OTHER)
Admission: RE | Admit: 2023-12-29 | Discharge: 2023-12-29 | Disposition: A | Discharge status: Home / Self Care | Source: Ambulatory Visit | Attending: Family Medicine | Admitting: Family Medicine

## 2023-12-29 DIAGNOSIS — I2510 Atherosclerotic heart disease of native coronary artery without angina pectoris: Secondary | ICD-10-CM | POA: Diagnosis not present

## 2023-12-29 DIAGNOSIS — R748 Abnormal levels of other serum enzymes: Secondary | ICD-10-CM | POA: Diagnosis not present

## 2023-12-29 DIAGNOSIS — N838 Other noninflammatory disorders of ovary, fallopian tube and broad ligament: Secondary | ICD-10-CM | POA: Diagnosis not present

## 2023-12-29 DIAGNOSIS — I77810 Thoracic aortic ectasia: Secondary | ICD-10-CM | POA: Diagnosis not present

## 2023-12-29 DIAGNOSIS — Z780 Asymptomatic menopausal state: Secondary | ICD-10-CM | POA: Diagnosis not present

## 2023-12-29 DIAGNOSIS — D251 Intramural leiomyoma of uterus: Secondary | ICD-10-CM | POA: Diagnosis not present

## 2023-12-29 DIAGNOSIS — R102 Pelvic and perineal pain unspecified side: Secondary | ICD-10-CM

## 2023-12-29 DIAGNOSIS — R1031 Right lower quadrant pain: Secondary | ICD-10-CM | POA: Insufficient documentation

## 2023-12-29 MED ORDER — IOHEXOL 300 MG/ML  SOLN
75.0000 mL | Freq: Once | INTRAMUSCULAR | Status: AC | PRN
  Administered 2023-12-29: 75 mL via INTRAVENOUS

## 2023-12-30 ENCOUNTER — Ambulatory Visit: Payer: Self-pay | Admitting: Family Medicine

## 2023-12-30 LAB — SJOGRENS SYNDROME-B EXTRACTABLE NUCLEAR ANTIBODY: SSB (La) (ENA) Antibody, IgG: <1 AI

## 2023-12-30 LAB — AMYLASE ISOENZYMES
Amylase: 300 U/L — ABNORMAL HIGH (ref 21–101)
Isoamylase-Pancreatic: 324 U/L — ABNORMAL HIGH (ref 16–46)

## 2023-12-30 LAB — SJOGRENS SYNDROME-A EXTRACTABLE NUCLEAR ANTIBODY: SSA (Ro) (ENA) Antibody, IgG: <1 AI

## 2023-12-31 ENCOUNTER — Ambulatory Visit: Payer: Self-pay | Admitting: Family Medicine

## 2023-12-31 DIAGNOSIS — N9489 Other specified conditions associated with female genital organs and menstrual cycle: Secondary | ICD-10-CM

## 2024-01-02 ENCOUNTER — Telehealth: Payer: Self-pay

## 2024-01-02 NOTE — Telephone Encounter (Signed)
 Copied from CRM [id number]. Topic: Clinical - Lab/Test Results >> Jan 02, 2024  9:34 AM Harlene ORN wrote: Reason for CRM: Rep from [facility] called to confirm the imaging done on 10/09 can be seen by the PCP.

## 2024-01-02 NOTE — Telephone Encounter (Signed)
 Provider aware.

## 2024-01-03 ENCOUNTER — Ambulatory Visit: Payer: Self-pay | Admitting: Family Medicine

## 2024-01-03 ENCOUNTER — Ambulatory Visit (HOSPITAL_BASED_OUTPATIENT_CLINIC_OR_DEPARTMENT_OTHER)
Admission: RE | Admit: 2024-01-03 | Discharge: 2024-01-03 | Disposition: A | Discharge status: Home / Self Care | Source: Ambulatory Visit | Attending: Family Medicine | Admitting: Family Medicine

## 2024-01-03 DIAGNOSIS — N9489 Other specified conditions associated with female genital organs and menstrual cycle: Secondary | ICD-10-CM | POA: Insufficient documentation

## 2024-01-03 DIAGNOSIS — K5730 Diverticulosis of large intestine without perforation or abscess without bleeding: Secondary | ICD-10-CM | POA: Diagnosis not present

## 2024-01-03 DIAGNOSIS — D259 Leiomyoma of uterus, unspecified: Secondary | ICD-10-CM | POA: Diagnosis not present

## 2024-01-03 MED ORDER — GADOBUTROL 1 MMOL/ML IV SOLN
6.7000 mL | Freq: Once | INTRAVENOUS | Status: AC | PRN
  Administered 2024-01-03: 6.7 mL via INTRAVENOUS

## 2024-01-04 ENCOUNTER — Telehealth: Payer: Self-pay

## 2024-01-04 DIAGNOSIS — M9903 Segmental and somatic dysfunction of lumbar region: Secondary | ICD-10-CM | POA: Diagnosis not present

## 2024-01-04 DIAGNOSIS — M545 Low back pain, unspecified: Secondary | ICD-10-CM | POA: Diagnosis not present

## 2024-01-04 DIAGNOSIS — M9904 Segmental and somatic dysfunction of sacral region: Secondary | ICD-10-CM | POA: Diagnosis not present

## 2024-01-04 DIAGNOSIS — M9905 Segmental and somatic dysfunction of pelvic region: Secondary | ICD-10-CM | POA: Diagnosis not present

## 2024-01-04 DIAGNOSIS — M546 Pain in thoracic spine: Secondary | ICD-10-CM | POA: Diagnosis not present

## 2024-01-04 DIAGNOSIS — M5134 Other intervertebral disc degeneration, thoracic region: Secondary | ICD-10-CM | POA: Diagnosis not present

## 2024-01-04 DIAGNOSIS — M5032 Other cervical disc degeneration, mid-cervical region, unspecified level: Secondary | ICD-10-CM | POA: Diagnosis not present

## 2024-01-04 DIAGNOSIS — M9902 Segmental and somatic dysfunction of thoracic region: Secondary | ICD-10-CM | POA: Diagnosis not present

## 2024-01-04 DIAGNOSIS — M9901 Segmental and somatic dysfunction of cervical region: Secondary | ICD-10-CM | POA: Diagnosis not present

## 2024-01-04 NOTE — Telephone Encounter (Signed)
 Copied from CRM [id number]. Topic: Clinical - Lab/Test Results >> Jan 03, 2024  4:52 PM Kristen Schultz wrote: Reason for CRM: Patient is returning a call from Dr. Antonio in regards to imaging. She would like for a nurse to call back with more clarification. 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 She believes that Dr. Antonio would like to speak withy her directly.

## 2024-01-05 ENCOUNTER — Telehealth: Payer: Self-pay | Admitting: *Deleted

## 2024-01-05 ENCOUNTER — Encounter: Payer: Self-pay | Admitting: Psychiatry

## 2024-01-05 NOTE — Telephone Encounter (Signed)
 Spoke with [name] regarding her referral to GYN oncology. She has an appointment scheduled with Dr. [name] on 10/20 at 0900. Patient agrees to date and time. She has been provided with office address and location. She is also aware of our mask and visitor policy. Patient verbalized understanding and will call with any questions.

## 2024-01-09 ENCOUNTER — Inpatient Hospital Stay: Admitting: Gynecologic Oncology

## 2024-01-09 ENCOUNTER — Inpatient Hospital Stay

## 2024-01-09 ENCOUNTER — Encounter: Payer: Self-pay | Admitting: Psychiatry

## 2024-01-09 ENCOUNTER — Ambulatory Visit: Payer: Self-pay | Admitting: Psychiatry

## 2024-01-09 ENCOUNTER — Telehealth: Payer: Self-pay | Admitting: *Deleted

## 2024-01-09 ENCOUNTER — Inpatient Hospital Stay: Attending: Psychiatry | Admitting: Psychiatry

## 2024-01-09 VITALS — BP 145/82 | HR 64 | Temp 98.1°F | Resp 19 | Ht 64.0 in | Wt 152.4 lb

## 2024-01-09 DIAGNOSIS — R011 Cardiac murmur, unspecified: Secondary | ICD-10-CM | POA: Insufficient documentation

## 2024-01-09 DIAGNOSIS — E785 Hyperlipidemia, unspecified: Secondary | ICD-10-CM | POA: Diagnosis not present

## 2024-01-09 DIAGNOSIS — Z79899 Other long term (current) drug therapy: Secondary | ICD-10-CM | POA: Insufficient documentation

## 2024-01-09 DIAGNOSIS — I10 Essential (primary) hypertension: Secondary | ICD-10-CM | POA: Insufficient documentation

## 2024-01-09 DIAGNOSIS — Z87891 Personal history of nicotine dependence: Secondary | ICD-10-CM | POA: Diagnosis not present

## 2024-01-09 DIAGNOSIS — K219 Gastro-esophageal reflux disease without esophagitis: Secondary | ICD-10-CM | POA: Diagnosis not present

## 2024-01-09 DIAGNOSIS — F1090 Alcohol use, unspecified, uncomplicated: Secondary | ICD-10-CM | POA: Diagnosis not present

## 2024-01-09 DIAGNOSIS — N9489 Other specified conditions associated with female genital organs and menstrual cycle: Secondary | ICD-10-CM

## 2024-01-09 DIAGNOSIS — R1900 Intra-abdominal and pelvic swelling, mass and lump, unspecified site: Secondary | ICD-10-CM | POA: Diagnosis not present

## 2024-01-09 DIAGNOSIS — F419 Anxiety disorder, unspecified: Secondary | ICD-10-CM | POA: Insufficient documentation

## 2024-01-09 DIAGNOSIS — Z8616 Personal history of COVID-19: Secondary | ICD-10-CM | POA: Insufficient documentation

## 2024-01-09 DIAGNOSIS — R32 Unspecified urinary incontinence: Secondary | ICD-10-CM | POA: Insufficient documentation

## 2024-01-09 DIAGNOSIS — Z860100 Personal history of colon polyps, unspecified: Secondary | ICD-10-CM | POA: Diagnosis not present

## 2024-01-09 LAB — CMP (CANCER CENTER ONLY)
ALT: 18 U/L (ref 0–44)
AST: 27 U/L (ref 15–41)
Albumin: 4.4 g/dL (ref 3.5–5.0)
Alkaline Phosphatase: 62 U/L (ref 38–126)
Anion gap: 8 (ref 5–15)
BUN: 13 mg/dL (ref 8–23)
CO2: 29 mmol/L (ref 22–32)
Calcium: 10.2 mg/dL (ref 8.9–10.3)
Chloride: 101 mmol/L (ref 98–111)
Creatinine: 0.64 mg/dL (ref 0.44–1.00)
GFR, Estimated: >60 mL/min (ref >60)
Glucose, Bld: 90 mg/dL (ref 70–99)
Potassium: 4.1 mmol/L (ref 3.5–5.1)
Sodium: 138 mmol/L (ref 135–145)
Total Bilirubin: 0.8 mg/dL (ref 0.0–1.2)
Total Protein: 7.8 g/dL (ref 6.5–8.1)

## 2024-01-09 LAB — CEA (ACCESS): CEA (CHCC): 3.68 ng/mL (ref 0.00–5.00)

## 2024-01-09 MED ORDER — SENNOSIDES-DOCUSATE SODIUM 8.6-50 MG PO TABS: 2.0000 | ORAL_TABLET | Freq: Every day | ORAL | 0 refills | Status: DC

## 2024-01-09 MED ORDER — TRAMADOL HCL 50 MG PO TABS: 50.0000 mg | ORAL_TABLET | Freq: Four times a day (QID) | ORAL | 0 refills | Status: DC | PRN

## 2024-01-09 NOTE — Progress Notes (Signed)
 GYNECOLOGIC ONCOLOGY NEW PATIENT CONSULTATION  Date of Service: 01/09/2024 Referring Provider: Antonio Meth, Jamee SAUNDERS, DO [address] [place],  [place] [address]   ASSESSMENT AND PLAN: Kristen Schultz is a 78 y.o. woman with a 5 cm right adnexal mass of unclear etiology.  We reviewed that the exact etiology of the pelvic mass is unclear, but could include a benign, borderline, or malignant process.  The recommended treatment is surgical excision to make a definitive diagnosis.   Given MRI with broad differential, it recommended additionally a CT with PO and IV contrast. This is ordered. Reviewed with patient that plan would likely still be for surgically excision unless the CT suggested this was more consistent with her appendix or a non-gynecologic origin.  Because the mass is relatively small, we feel that a minimally invasive approach is feasible, using robotic assistance.    In the event of malignancy or borderline tumor on frozen section, we will perform indicated staging procedures. We discussed that these procedures may include hysterectomy, omentectomy pelvic and/or para-aortic lymphadenectomy, peritoneal biopsies. We would also remove any tissue concerning for metastatic disease which could require additional procedures including bowel surgery.  Patient was consented for: Robotic assisted bilateral salpingo-oophorectomy, possible hysterectomy, possible staging including possible mini laparotomy for omentectomy on 01/24/2024.  The risks of surgery were discussed in detail and she understands these to including but not limited to bleeding requiring a blood transfusion, infection, injury to adjacent organs (including but not limited to the bowels, bladder, ureters, nerves, blood vessels), thromboembolic events, wound separation, hernia, vaginal cuff separation, possible risk of lymphedema and lymphocyst if lymphadenectomy performed, unforseen complication, possible need for  re-exploration, and medical complications such as heart attack, stroke, pneumonia.  If the patient experiences any of these events, she understands that her hospitalization or recovery may be prolonged and that she may need to take additional medications for a prolonged period. The patient will receive DVT and antibiotic prophylaxis as indicated. She voiced a clear understanding. She had the opportunity to ask questions and informed consent was obtained today. She wishes to proceed.  She will proceed to the lab today for CA125, CEA, CA 19-9.  Given dilated ascending thoracic aorta and two-vessel coronary atherosclerosis, recommend PCP clearance prior to surgery although her METs are greater than 4.  Also reviewed her alcohol use at 14 glasses of wine per week.  Discussed consideration of observation overnight postoperatively.  However patient feels that she can comfortably reduce her alcohol intake and will reduce to 1 glass of wine per week or less.  In the setting would be reasonable discharge home same day if doing well.   All preoperative instructions were reviewed. Postoperative expectations were also reviewed. Written handouts were provided to the patient.   A copy of this note was sent to the patient's referring provider.  Hoy Masters, MD Gynecologic Oncology   Medical Decision Making I personally spent  TOTAL 60 minutes face-to-face and non-face-to-face in the care of this patient, which includes all pre, intra, and post visit time on the date of service.   ------------  CC: Pelvic mass  HISTORY OF PRESENT ILLNESS:  Kristen Schultz is a 78 y.o. woman who is seen in consultation at the request of Antonio Meth Jamee SAUNDERS, * for evaluation of pelvic mass.  Patient presented to her family medicine provider on 12/12/2023 for abdominal pain.  She reports that the pain is described as discomfort in the right side.  She  has also been experiencing bloating.  This has been occurring  for several months.  She also noted persistent fatigue over these months.  A prior ultrasound was reportedly done in 2022 that did not visualize the ovaries.  No abdominal or pelvic exam appeared to be completed at that time.  She was ordered for a pelvic ultrasound which was completed on 12/29/2023 which noted a 5.7 x 2.5 x 1.9 cm right adnexal mass, predominantly solid with internal 1.8 cm cystic focus.  A CT chest was also completed at that time with no acute findings but with note of a dilated 4.2 cm ascending thoracic aorta and two-vessel coronary atherosclerosis.  A subsequent MRI pelvis was completed on 01/03/2024 which noted a right pelvic mass tracking along the pelvic sidewall measuring 5.0 x 1.8 x 2.4 cm.  The differential on the MRI was broad including a broad ligament or round ligament fibroid versus a dilated fallopian tube versus an ovarian neoplasm versus dilated appendix versus pelvic sidewall lymphadenopathy.  No left adnexal mass noted.  No other lymphadenopathy or free fluid.  Today patient presents with her husband.  She reports that the right side mostly is a dull ache at rest.  She can feel a little bit more if she pushes the area or bends over.  Also notes she has pain in her back for which she is seeing a [facility].  Not take any pain medication for it.  Does note some weight gain recently and abdominal bloating.  Also has had occasional urine incontinence over the last 8 to 9 months and periods of diarrhea at times.  MRI noted diverticular disease.  She otherwise denies early satiety, significant weight loss.    PAST MEDICAL HISTORY: Past Medical History:  Diagnosis Date   Anxiety    Cellulitis of chin 11/17/2020   Colon polyps    COVID-19 11/17/2020   Gastric ulcer    per patient; in the 1970s   GERD (gastroesophageal reflux disease)    H. pylori infection    Heart murmur    Hyperlipidemia    no treatment per pt   Hypertension    Liver cyst    Ovarian cyst 1984    Pneumonia     PAST SURGICAL HISTORY: Past Surgical History:  Procedure Laterality Date   COLONOSCOPY  02/10/2016   DENTAL SURGERY  2000   dental implant   DENTAL SURGERY  2021   2 molars taken out    ESOPHAGOGASTRODUODENOSCOPY  11/06/2018   [facility]    ESOPHAGOGASTRODUODENOSCOPY  07/23/2019   [facility]    EUS  07/23/2019   [facility]    HEMORRHOID SURGERY     LAPAROSCOPIC LIVER CYST REMOVAL     at 78 years old   OVARIAN CYST SURGERY Right    benign    OB/GYN HISTORY: OB History  Gravida Para Term Preterm AB Living  1 1 1   1   SAB IAB Ectopic Multiple Live Births      1    # Outcome Date GA Lbr Len/2nd Weight Sex Type Anes PTL Lv  1 Term      Vag-Spont   LIV      Age at menarche: 21 Age at menopause: 23 Hx of HRT: no Hx of STI: no Last pap: NILM, HPV HR neg 11/18/15 History of abnormal pap smears: no  SCREENING STUDIES:  Last mammogram: 04/28/23 Last colonoscopy: 11/17/22  MEDICATIONS:  Current Outpatient Medications:    citalopram  (CELEXA )  10 MG tablet, 1 1/2 po every day, Disp: 135 tablet, Rfl: 1   losartan  (COZAAR ) 50 MG tablet, Take 1 tablet (50 mg total) by mouth daily., Disp: 90 tablet, Rfl: 1   Multiple Vitamin (MULTIVITAMIN) tablet, Take 1 tablet by mouth daily., Disp: , Rfl:    mupirocin  ointment (BACTROBAN ) 2 %, Apply 1 Application topically 2 (two) times daily. (Patient not taking: Reported on 01/05/2024), Disp: 22 g, Rfl: 0  ALLERGIES: Allergies  Allergen Reactions   Bee Venom Anaphylaxis    Bee stings   Codeine     Extreme Vomiting   Other Other (See Comments)    CDN - reaction unknown   Penicillins     Redness, swelling and itching Can take Keflex    Tetracyclines & Related     Yeast Infection and Stomach problems   Xylocaine Dental [Lidocaine-Epinephrine (Pf)]     FAMILY HISTORY: Family History  Problem Relation Age of Onset   Heart disease Father    Heart failure Sister    Heart disease Sister     Dementia Sister    Heart disease Brother 40   Heart disease Brother    Alzheimer's disease Brother    Dementia Brother    Colon cancer Neg Hx    Esophageal cancer Neg Hx    Pancreatic cancer Neg Hx    Stomach cancer Neg Hx    Rectal cancer Neg Hx    BRCA 1/2 Neg Hx    Breast cancer Neg Hx    Cancer - Colon Neg Hx    Fibrocystic breast disease Neg Hx    Ovarian cancer Neg Hx    Endometrial cancer Neg Hx    Prostate cancer Neg Hx     SOCIAL HISTORY: Social History   Socioeconomic History   Marital status: Married    Spouse name: Tom   Number of children: 1   Years of education: Not on file   Highest education level: 12th grade  Occupational History   Occupation: retired    Comment: retired  Tobacco Use   Smoking status: Former    Current packs/day: 0.00    Average packs/day: 0.3 packs/day for 15.0 years (4.5 ttl pk-yrs)    Types: Cigarettes    Start date: 11/18/1970    Quit date: 11/17/1985    Years since quitting: 38.1   Smokeless tobacco: Never  Vaping Use   Vaping status: Never Used  Substance and Sexual Activity   Alcohol use: Yes    Alcohol/week: 14.0 standard drinks of alcohol    Types: 14 Glasses of wine per week    Comment: 2 glasses per night   Drug use: No   Sexual activity: Not Currently    Partners: Male  Other Topics Concern   Not on file  Social History Narrative   Exercise- Walks about 15-22 miles per week with womens group.   Social Drivers of Corporate investment banker Strain: Low Risk  (12/10/2023)   Overall Financial Resource Strain (CARDIA)    Difficulty of Paying Living Expenses: Not hard at all  Food Insecurity: No Food Insecurity (12/10/2023)   Hunger Vital Sign    Worried About Running Out of Food in the Last Year: Never true    Ran Out of Food in the Last Year: Never true  Transportation Needs: No Transportation Needs (12/10/2023)   PRAPARE - Administrator, Civil Service (Medical): No    Lack of Transportation  (Non-Medical): No  Physical Activity: Insufficiently  Active (12/10/2023)   Exercise Vital Sign    Days of Exercise per Week: 2 days    Minutes of Exercise per Session: 30 min  Stress: No Stress Concern Present (12/10/2023)   [employer] of Occupational Health - Occupational Stress Questionnaire    Feeling of Stress: Only a little  Social Connections: Socially Integrated (12/10/2023)   Social Connection and Isolation Panel    Frequency of Communication with Friends and Family: Three times a week    Frequency of Social Gatherings with Friends and Family: Twice a week    Attends Religious Services: 1 to 4 times per year    Active Member of [employer] or Organizations: Yes    Attends Engineer, structural: More than 4 times per year    Marital Status: Married  Catering manager Violence: Not At Risk (04/05/2023)   Humiliation, Afraid, Rape, and Kick questionnaire    Fear of Current or Ex-Partner: No    Emotionally Abused: No    Physically Abused: No    Sexually Abused: No    REVIEW OF SYSTEMS: New patient intake form was reviewed.  Complete 10-system review is negative except for the following: Urinary frequency, joint pain, abdominal distention, back pain, decreased concentration, abdominal pain  PHYSICAL EXAM: BP (!) 161/76 (BP Location: Left Arm, Patient Position: Sitting)   Pulse 64   Temp 98.1 F (36.7 C) (Oral)   Resp 19   Ht 5' 4 (1.626 m)   Wt 152 lb 6.4 oz (69.1 kg)   SpO2 100%   BMI 26.16 kg/m  Constitutional: No acute distress. Neuro/Psych: Alert, oriented.  Head and Neck: Normocephalic, atraumatic. Neck symmetric without masses. Sclera anicteric.  Respiratory: Normal work of breathing. Clear to auscultation bilaterally. Cardiovascular: Regular rate and rhythm, no murmurs, rubs, or gallops. Abdomen: Normoactive bowel sounds. Soft, non-distended, non-tender to palpation. No masses appreciated. Well healed right upper quadrant incision and laparoscopic  incisions Extremities: Grossly normal range of motion. Warm, well perfused. No edema bilaterally. Skin: No rashes or lesions. Lymphatic: No cervical, supraclavicular, or inguinal adenopathy. Genitourinary: External genitalia without lesions. Urethral meatus without lesions or prolapse. On speculum exam, vagina and cervix without lesions, atrophic mucosa. Bimanual exam reveals normal cervix and small mobile uterus.  No adnexal masses palpated. Exam chaperoned by Darice Sanes, RN   LABORATORY AND RADIOLOGIC DATA: Outside medical records were reviewed to synthesize the above history, along with the history and physical obtained during the visit.  Outside laboratory, pathology, and imaging reports were reviewed, with pertinent results below.  I personally reviewed the outside images.  WBC  Date Value Ref Range Status  12/12/2023 7.4 4.0 - 10.5 K/uL Final   Hemoglobin  Date Value Ref Range Status  12/12/2023 14.7 12.0 - 15.0 g/dL Final  95/90/7979 86.3 11.1 - 15.9 g/dL Final   HCT  Date Value Ref Range Status  12/12/2023 44.4 36.0 - 46.0 % Final   Hematocrit  Date Value Ref Range Status  06/29/2018 41.5 34.0 - 46.6 % Final   Platelets  Date Value Ref Range Status  12/12/2023 289.0 150.0 - 400.0 K/uL Final  06/29/2018 286 150 - 450 x10E3/uL Final   Magnesium  Date Value Ref Range Status  12/12/2023 2.1 1.5 - 2.5 mg/dL Final   Creat  Date Value Ref Range Status  12/10/2019 0.64 0.60 - 0.93 mg/dL Final    Comment:    For patients >58 years of age, the reference limit for Creatinine is approximately 13% higher for people identified  as African-American. .    Creatinine, Ser  Date Value Ref Range Status  12/12/2023 1.03 0.40 - 1.20 mg/dL Final   AST  Date Value Ref Range Status  12/12/2023 22 0 - 37 U/L Final   ALT  Date Value Ref Range Status  12/12/2023 14 0 - 35 U/L Final    MR Pelvis W Wo Contrast 01/03/2024  Narrative CLINICAL DATA:  Right adnexal mass identified  on previous ultrasound.  EXAM: MRI PELVIS WITHOUT AND WITH CONTRAST  TECHNIQUE: Multiplanar multisequence MR imaging of the pelvis was performed both before and after administration of intravenous contrast.  CONTRAST:  6.7mL GADAVIST GADOBUTROL 1 MMOL/ML IV SOLN  COMPARISON:  Pelvic ultrasound 12/29/2023  FINDINGS: Urinary Tract:  Bladder unremarkable.  Bowel:  Diverticular disease noted left colon.  Vascular/Lymphatic: Choose 1  Reproductive: Uterus measures 6.1 x 2.8 x 5.1 cm with several tiny fibroids evident. Endometrium does not appear thickened.  Left ovary measures 3.0 x 1.1 cm on axial imaging but is not well seen on coronal or sagittal pulse sequences. No left adnexal mass.  Right ovary is not discretely visible. As on the recent ultrasound, there is an elongated mass lesion in the right pelvis tracking along the pelvic sidewall from the adnexal space down towards the groin. This has a tubular configuration measuring on the order of 5.0 x 1.8 x 2.4 cm and demonstrates a small cystic component posteriorly with areas of enhancement. Cecal tip is low in the right pelvis.  Other:  No substantial ascites.  Musculoskeletal: No focal suspicious marrow enhancement within the visualized bony anatomy.  IMPRESSION: 1. As on the recent ultrasound, there is an elongated mass lesion in the right pelvis tracking along the pelvic sidewall from the adnexal space down towards the groin. Right ovary is not discretely visible. The lesion has a tubular configuration measuring on the order of 5.0 x 1.8 x 2.4 cm and demonstrates a small cystic component posteriorly with areas of enhancement. Broad ligament or round ligament fibroid would be consideration but less likely given patient age and lack of mass lesion in this region on previous CT imaging of 2022. Dilated fallopian tube a consideration although this was not suspected in ultrasound. Imaging features are nonspecific but  could be related to a primary ovarian neoplasm. Given proximity to the cecum, a dilated appendix not entirely excluded. Pelvic sidewall lymphadenopathy is a consideration. CT imaging with oral and intravenous contrast suggested for better spatial resolution. Close follow-up warranted as neoplasm a concern. 2. No left adnexal mass. 3. Several tiny uterine fibroids. 4. Left colonic diverticulosis.   Electronically Signed By: Camellia Candle M.D. On: 01/03/2024 10:11   CT Chest W Contrast 12/29/2023  Narrative EXAM: CT CHEST WITH CONTRAST 12/29/2023 04:56:53 PM  TECHNIQUE: CT of the chest was performed with the administration of intravenous contrast. 75 cc Omnipaque  300 was administered. Multiplanar reformatted images are provided for review. Automated exposure control, iterative reconstruction, and/or weight based adjustment of the mA/kV was utilized to reduce the radiation dose to as low as reasonably achievable.  COMPARISON: Chest radiograph dated 07/14/2019.  CLINICAL HISTORY: Elevated amylase. 78 y/o female with chronic elevated amylase and fatigue.  FINDINGS:  MEDIASTINUM: Left anterior descending and left circumflex coronary atherosclerosis. Pericardium is unremarkable. Atherosclerotic thoracic aorta with dilated 4.2 cm ascending thoracic aorta. The central airways are clear.  LYMPH NODES: No mediastinal, hilar or axillary lymphadenopathy.  LUNGS AND PLEURA: No focal consolidation. No significant pulmonary nodules. No pleural effusion or pneumothorax.  SOFT TISSUES/BONES: Chronic-appearing severe T7, mild T8, and mild T12 vertebral compression fractures. Moderate thoracic spondylosis. No acute abnormality of the soft tissues.  UPPER ABDOMEN: Limited images of the upper abdomen demonstrates no acute abnormality.  IMPRESSION: 1. No acute findings. 2. Dilated 4.2 cm ascending thoracic aorta. Follow-up chest CT angiogram suggested in 12 months. 3. Two  vessel coronary atherosclerosis. 4. Chronic-appearing severe T7 and mild T8/T12 vertebral compression fractures.  Electronically signed by: Selinda Blue MD 12/30/2023 03:44 PM EDT RP Workstation: [id number]   US  Pelvic Complete With Transvaginal 12/29/2023  Narrative EXAM: US  Pelvis, Complete Transvaginal and Transabdominal without Doppler  TECHNIQUE: Transabdominal and transvaginal pelvic duplex ultrasound using B-mode/gray scaled imaging without Doppler spectral analysis and color flow was obtained.  COMPARISON: CT abdomen/pelvis dated 03/06/2021 and pelvic sonogram dated 04/10/2020.  CLINICAL HISTORY: pelvic pain, elevated amylase. RLQ pain/discomfort 3-4 months, worsening  FINDINGS:  UTERUS: Uterus measures 7.1 x 2.2 x 4.6 cm, uterine volume 37 cc. Normal size uterus. Coarsely calcified intramural 0.9 cm right uterine body fibroid. No additional uterine fibroids.  ENDOMETRIAL STRIPE: Endometrial measures 2 mm. No endometrial cavity fluid or focal endometrial mass.  RIGHT OVARY: There is an apparent ovoid 5.7 x 2.5 x 1.9 cm right adnexal mass, predominantly solid with an internal 1.8 cm cystic focus, not previously visualized on 04/10/2020 pelvic sonogram or 03/06/2021 CT abdomen/pelvis study.  LEFT OVARY: Left ovary not visualized. No left adnexal masses.  FREE FLUID: No free fluid in the pelvis.  IMPRESSION: 1. Apparent ovoid right adnexal mass measuring 5.7 x 2.5 x 1.9 cm with 1.8 cm cystic component, new from prior studies. Right ovarian neoplasm not excluded. Further evaluation with MRI pelvis without and with IV contrast is recommended at this time. 2. Nonvisualization of the left ovary. 3. Unremarkable small postmenopausal uterus.  These results will be called to the ordering clinician or representative by the Radiologist Assistant, and communication documented in the PACS or [employer].  Electronically signed by: Selinda Blue MD 12/30/2023 07:28  PM EDT RP Workstation: [id number]

## 2024-01-09 NOTE — Patient Instructions (Signed)
 Preparing for your Surgery  Plan for surgery on January 24, 2024 with Dr. Hoy Masters at [facility]. You will be scheduled for a robotic assisted bilateral salpingo-oophorectomy (removal of both fallopian tubes and ovaries), possible total laparoscopic hysterectomy (removal of uterus and cervix), possible staging, possible mini-laparotomy (larger incision on your abdomen if needed).   Pre-operative Testing -You will receive a phone call from presurgical testing at [facility] to arrange for a pre-operative appointment and lab work.  -Bring your insurance card, copy of an advanced directive if applicable, medication list  -At that visit, you will be asked to sign a consent for a possible blood transfusion in case a transfusion becomes necessary during surgery.  The need for a blood transfusion is rare but having consent is a necessary part of your care.     -You should not be taking blood thinners or aspirin at least ten days prior to surgery unless instructed by your surgeon.  -Do not take supplements such as fish oil (omega 3), red yeast rice, turmeric before your surgery. STOP TAKING AT LEAST 10 DAYS BEFORE SURGERY. You want to avoid medications with aspirin in them including headache powders such as BC or Goody's), Excedrin migraine.  -If you are taking a GLP-1 medication/injection such as Ozempic, Mounjaro, [id number], this needs to be held before surgery for at least 7 days before.  Day Before Surgery at Home -You will be asked to take in a light diet the day before surgery. You will be advised you can have clear liquids up until 3 hours before your surgery.    Eat a light diet the day before surgery.  Examples including soups, broths, toast, yogurt, mashed potatoes.  AVOID GAS PRODUCING FOODS AND BEVERAGES. Things to avoid include carbonated beverages (fizzy beverages, sodas), raw fruits and raw vegetables (uncooked), or beans.   If your bowels are filled with gas, your  surgeon will have difficulty visualizing your pelvic organs which increases your surgical risks.  Your role in recovery Your role is to become active as soon as directed by your doctor, while still giving yourself time to heal.  Rest when you feel tired. You will be asked to do the following in order to speed your recovery:  - Cough and breathe deeply. This helps to clear and expand your lungs and can prevent pneumonia after surgery.  - STAY ACTIVE WHEN YOU GET HOME. Do mild physical activity. Walking or moving your legs help your circulation and body functions return to normal. Do not try to get up or walk alone the first time after surgery.   -If you develop swelling on one leg or the other, pain in the back of your leg, redness/warmth in one of your legs, please call the office or go to the Emergency Room to have a doppler to rule out a blood clot. For shortness of breath, chest pain-seek care in the Emergency Room as soon as possible. - Actively manage your pain. Managing your pain lets you move in comfort. We will ask you to rate your pain on a scale of zero to 10. It is your responsibility to tell your doctor or nurse where and how much you hurt so your pain can be treated.  Special Considerations -If you are diabetic, you may be placed on insulin after surgery to have closer control over your blood sugars to promote healing and recovery.  This does not mean that you will be discharged on insulin.  If applicable, your  oral antidiabetics will be resumed when you are tolerating a solid diet.  -Your final pathology results from surgery should be available around one week after surgery and the results will be relayed to you when available.  -Dr. Olam Mill is the surgeon that assists your GYN Oncologist with surgery.  If you end up staying the night, the next day after your surgery you will either see Dr. Viktoria, Dr. [name], or Dr. Olam Mill.  -FMLA forms can be faxed to  [phone] and please allow 5-7 business days for completion.  Pain Management After Surgery -You will be prescribed your pain medication and bowel regimen medications before surgery so that you can have these available when you are discharged from the hospital. The pain medication is for use ONLY AFTER surgery and a new prescription will not be given.   -Make sure that you have Tylenol and Ibuprofen IF YOU ARE ABLE TO TAKE THESE MEDICATIONS at home to use on a regular basis after surgery for pain control. We recommend alternating the medications every hour to six hours since they work differently and are processed in the body differently for pain relief.  -Review the attached handout on narcotic use and their risks and side effects.   Bowel Regimen -You will be prescribed Sennakot-S to take nightly to prevent constipation especially if you are taking the narcotic pain medication intermittently.  It is important to prevent constipation and drink adequate amounts of liquids. You can stop taking this medication when you are not taking pain medication and you are back on your normal bowel routine.  Risks of Surgery Risks of surgery are low but include bleeding, infection, damage to surrounding structures, re-operation, blood clots, and very rarely death.   Blood Transfusion Information (For the consent to be signed before surgery)  We will be checking your blood type before surgery so in case of emergencies, we will know what type of blood you would need.                                            WHAT IS A BLOOD TRANSFUSION?  A transfusion is the replacement of blood or some of its parts. Blood is made up of multiple cells which provide different functions. Red blood cells carry oxygen and are used for blood loss replacement. White blood cells fight against infection. Platelets control bleeding. Plasma helps clot blood. Other blood products are available for specialized needs, such as  hemophilia or other clotting disorders. BEFORE THE TRANSFUSION  Who gives blood for transfusions?  You may be able to donate blood to be used at a later date on yourself (autologous donation). Relatives can be asked to donate blood. This is generally not any safer than if you have received blood from a stranger. The same precautions are taken to ensure safety when a relative's blood is donated. Healthy volunteers who are fully evaluated to make sure their blood is safe. This is blood bank blood. Transfusion therapy is the safest it has ever been in the practice of medicine. Before blood is taken from a donor, a complete history is taken to make sure that person has no history of diseases nor engages in risky social behavior (examples are intravenous drug use or sexual activity with multiple partners). The donor's travel history is screened to minimize risk of transmitting infections, such as malaria. The donated blood  is tested for signs of infectious diseases, such as HIV and hepatitis. The blood is then tested to be sure it is compatible with you in order to minimize the chance of a transfusion reaction. If you or a relative donates blood, this is often done in anticipation of surgery and is not appropriate for emergency situations. It takes many days to process the donated blood. RISKS AND COMPLICATIONS Although transfusion therapy is very safe and saves many lives, the main dangers of transfusion include:  Getting an infectious disease. Developing a transfusion reaction. This is an allergic reaction to something in the blood you were given. Every precaution is taken to prevent this. The decision to have a blood transfusion has been considered carefully by your caregiver before blood is given. Blood is not given unless the benefits outweigh the risks.  AFTER SURGERY INSTRUCTIONS  Return to work: 4-6 weeks if applicable  Activity: 1. Be up and out of the bed during the day.  Take a nap if needed.   You may walk up steps but be careful and use the hand rail.  Stair climbing will tire you more than you think, you may need to stop part way and rest.   2. No lifting or straining for 6 weeks over 10 pounds. No pushing, pulling, straining for 6 weeks.  3. No driving for 4-89 days when the following criteria have been met: Do not drive if you are taking narcotic pain medicine and make sure that your reaction time has returned.   4. You can shower as soon as the next day after surgery. Shower daily.  Use your regular soap and water (not directly on the incision) and pat your incision(s) dry afterwards; don't rub.  No tub baths or submerging your body in water until cleared by your surgeon. If you have the soap that was given to you by pre-surgical testing that was used before surgery, you do not need to use it afterwards because this can irritate your incisions.   5. No sexual activity and nothing in the vagina for 10-12 weeks.  6. You may experience a small amount of clear drainage from your incisions, which is normal.  If the drainage persists, increases, or changes color please call the office.  7. Do not use creams, lotions, or ointments such as neosporin on your incisions after surgery until advised by your surgeon because they can cause removal of the dermabond glue on your incisions.    8. You may experience vaginal spotting after surgery or when the stitches at the top of the vagina begin to dissolve.  The spotting is normal but if you experience heavy bleeding, call our office.  9. Take Tylenol or ibuprofen first for pain if you are able to take these medications and only use narcotic pain medication for severe pain not relieved by the Tylenol or Ibuprofen.  Monitor your Tylenol intake to a max of 4,000 mg in a 24 hour period. You can alternate these medications after surgery.  Diet: 1. Low sodium Heart Healthy Diet is recommended but you are cleared to resume your normal (before surgery)  diet after your procedure.  2. It is safe to use a laxative, such as Miralax or Colace, if you have difficulty moving your bowels before surgery. You have been prescribed Sennakot-S to take at bedtime every evening after surgery to keep bowel movements regular and to prevent constipation.    Wound Care: 1. Keep clean and dry.  Shower daily.  Reasons  to call the Doctor: Fever - Oral temperature greater than 100.4 degrees Fahrenheit Foul-smelling vaginal discharge Difficulty urinating Nausea and vomiting Increased pain at the site of the incision that is unrelieved with pain medicine. Difficulty breathing with or without chest pain New calf pain especially if only on one side Sudden, continuing increased vaginal bleeding with or without clots.   Contacts: For questions or concerns you should contact:  Dr. Hoy Masters at [phone]  Eleanor Epps, NP at [phone]  After Hours: call [phone] and have the GYN Oncologist paged/contacted (after 5 pm or on the weekends). You will speak with an after hours RN and let he or she know you have had surgery.  Messages sent via mychart are for non-urgent matters and are not responded to after hours so for urgent needs, please call the after hours number.

## 2024-01-09 NOTE — Progress Notes (Signed)
 Patient here for a consult with Dr. [name] and for a pre-operative appointment prior to her scheduled surgery on 01/24/2024. She is scheduled for a robotic assisted bilateral salpingo-oophorectomy, possible total laparoscopic hysterectomy, possible staging, possible mini-laparotomy.  The surgery was discussed in detail.  See after visit summary for additional details.    Discussed post-op pain management in detail including the aspects of the enhanced recovery pathway.  Advised her that a new prescription would be sent in and it is only to be used for after her upcoming surgery.  We discussed the use of tylenol post-op and to monitor for a maximum of 4,000 mg in a 24 hour period.  Also prescribed sennakot to be used after surgery and to hold if having loose stools.  Discussed bowel regimen in detail.     Discussed the use of SCDs and measures to take at home to prevent DVT including frequent mobility.  Reportable signs and symptoms of DVT discussed. Post-operative instructions discussed and expectations for after surgery. Incisional care discussed as well including reportable signs and symptoms including erythema, drainage, wound separation.     30 minutes spent with the patient.  Verbalizing understanding of material discussed. No needs or concerns voiced at the end of the visit.   Advised patient to call for any needs.  Advised that her post-operative medications had been prescribed and could be picked up at any time.    This appointment is included in the global surgical bundle as pre-operative teaching and has no charge.

## 2024-01-09 NOTE — H&P (View-Only) (Signed)
 GYNECOLOGIC ONCOLOGY NEW PATIENT CONSULTATION  Date of Service: 01/09/2024 Referring Provider: Antonio Meth, Jamee SAUNDERS, DO [address] [place],  [place] [address]   ASSESSMENT AND PLAN: Kristen Schultz is a 78 y.o. woman with a 5 cm right adnexal mass of unclear etiology.  We reviewed that the exact etiology of the pelvic mass is unclear, but could include a benign, borderline, or malignant process.  The recommended treatment is surgical excision to make a definitive diagnosis.   Given MRI with broad differential, it recommended additionally a CT with PO and IV contrast. This is ordered. Reviewed with patient that plan would likely still be for surgically excision unless the CT suggested this was more consistent with her appendix or a non-gynecologic origin.  Because the mass is relatively small, we feel that a minimally invasive approach is feasible, using robotic assistance.    In the event of malignancy or borderline tumor on frozen section, we will perform indicated staging procedures. We discussed that these procedures may include hysterectomy, omentectomy pelvic and/or para-aortic lymphadenectomy, peritoneal biopsies. We would also remove any tissue concerning for metastatic disease which could require additional procedures including bowel surgery.  Patient was consented for: Robotic assisted bilateral salpingo-oophorectomy, possible hysterectomy, possible staging including possible mini laparotomy for omentectomy on 01/24/2024.  The risks of surgery were discussed in detail and she understands these to including but not limited to bleeding requiring a blood transfusion, infection, injury to adjacent organs (including but not limited to the bowels, bladder, ureters, nerves, blood vessels), thromboembolic events, wound separation, hernia, vaginal cuff separation, possible risk of lymphedema and lymphocyst if lymphadenectomy performed, unforseen complication, possible need for  re-exploration, and medical complications such as heart attack, stroke, pneumonia.  If the patient experiences any of these events, she understands that her hospitalization or recovery may be prolonged and that she may need to take additional medications for a prolonged period. The patient will receive DVT and antibiotic prophylaxis as indicated. She voiced a clear understanding. She had the opportunity to ask questions and informed consent was obtained today. She wishes to proceed.  She will proceed to the lab today for CA125, CEA, CA 19-9.  Given dilated ascending thoracic aorta and two-vessel coronary atherosclerosis, recommend PCP clearance prior to surgery although her METs are greater than 4.  Also reviewed her alcohol use at 14 glasses of wine per week.  Discussed consideration of observation overnight postoperatively.  However patient feels that she can comfortably reduce her alcohol intake and will reduce to 1 glass of wine per week or less.  In the setting would be reasonable discharge home same day if doing well.   All preoperative instructions were reviewed. Postoperative expectations were also reviewed. Written handouts were provided to the patient.   A copy of this note was sent to the patient's referring provider.  Hoy Masters, MD Gynecologic Oncology   Medical Decision Making I personally spent  TOTAL 60 minutes face-to-face and non-face-to-face in the care of this patient, which includes all pre, intra, and post visit time on the date of service.   ------------  CC: Pelvic mass  HISTORY OF PRESENT ILLNESS:  Kristen Schultz is a 78 y.o. woman who is seen in consultation at the request of Antonio Meth Jamee SAUNDERS, * for evaluation of pelvic mass.  Patient presented to her family medicine provider on 12/12/2023 for abdominal pain.  She reports that the pain is described as discomfort in the right side.  She  has also been experiencing bloating.  This has been occurring  for several months.  She also noted persistent fatigue over these months.  A prior ultrasound was reportedly done in 2022 that did not visualize the ovaries.  No abdominal or pelvic exam appeared to be completed at that time.  She was ordered for a pelvic ultrasound which was completed on 12/29/2023 which noted a 5.7 x 2.5 x 1.9 cm right adnexal mass, predominantly solid with internal 1.8 cm cystic focus.  A CT chest was also completed at that time with no acute findings but with note of a dilated 4.2 cm ascending thoracic aorta and two-vessel coronary atherosclerosis.  A subsequent MRI pelvis was completed on 01/03/2024 which noted a right pelvic mass tracking along the pelvic sidewall measuring 5.0 x 1.8 x 2.4 cm.  The differential on the MRI was broad including a broad ligament or round ligament fibroid versus a dilated fallopian tube versus an ovarian neoplasm versus dilated appendix versus pelvic sidewall lymphadenopathy.  No left adnexal mass noted.  No other lymphadenopathy or free fluid.  Today patient presents with her husband.  She reports that the right side mostly is a dull ache at rest.  She can feel a little bit more if she pushes the area or bends over.  Also notes she has pain in her back for which she is seeing a [facility].  Not take any pain medication for it.  Does note some weight gain recently and abdominal bloating.  Also has had occasional urine incontinence over the last 8 to 9 months and periods of diarrhea at times.  MRI noted diverticular disease.  She otherwise denies early satiety, significant weight loss.    PAST MEDICAL HISTORY: Past Medical History:  Diagnosis Date   Anxiety    Cellulitis of chin 11/17/2020   Colon polyps    COVID-19 11/17/2020   Gastric ulcer    per patient; in the 1970s   GERD (gastroesophageal reflux disease)    H. pylori infection    Heart murmur    Hyperlipidemia    no treatment per pt   Hypertension    Liver cyst    Ovarian cyst 1984    Pneumonia     PAST SURGICAL HISTORY: Past Surgical History:  Procedure Laterality Date   COLONOSCOPY  02/10/2016   DENTAL SURGERY  2000   dental implant   DENTAL SURGERY  2021   2 molars taken out    ESOPHAGOGASTRODUODENOSCOPY  11/06/2018   [facility]    ESOPHAGOGASTRODUODENOSCOPY  07/23/2019   [facility]    EUS  07/23/2019   [facility]    HEMORRHOID SURGERY     LAPAROSCOPIC LIVER CYST REMOVAL     at 78 years old   OVARIAN CYST SURGERY Right    benign    OB/GYN HISTORY: OB History  Gravida Para Term Preterm AB Living  1 1 1   1   SAB IAB Ectopic Multiple Live Births      1    # Outcome Date GA Lbr Len/2nd Weight Sex Type Anes PTL Lv  1 Term      Vag-Spont   LIV      Age at menarche: 21 Age at menopause: 23 Hx of HRT: no Hx of STI: no Last pap: NILM, HPV HR neg 11/18/15 History of abnormal pap smears: no  SCREENING STUDIES:  Last mammogram: 04/28/23 Last colonoscopy: 11/17/22  MEDICATIONS:  Current Outpatient Medications:    citalopram  (CELEXA )  10 MG tablet, 1 1/2 po every day, Disp: 135 tablet, Rfl: 1   losartan  (COZAAR ) 50 MG tablet, Take 1 tablet (50 mg total) by mouth daily., Disp: 90 tablet, Rfl: 1   Multiple Vitamin (MULTIVITAMIN) tablet, Take 1 tablet by mouth daily., Disp: , Rfl:    mupirocin  ointment (BACTROBAN ) 2 %, Apply 1 Application topically 2 (two) times daily. (Patient not taking: Reported on 01/05/2024), Disp: 22 g, Rfl: 0  ALLERGIES: Allergies  Allergen Reactions   Bee Venom Anaphylaxis    Bee stings   Codeine     Extreme Vomiting   Other Other (See Comments)    CDN - reaction unknown   Penicillins     Redness, swelling and itching Can take Keflex    Tetracyclines & Related     Yeast Infection and Stomach problems   Xylocaine Dental [Lidocaine-Epinephrine (Pf)]     FAMILY HISTORY: Family History  Problem Relation Age of Onset   Heart disease Father    Heart failure Sister    Heart disease Sister     Dementia Sister    Heart disease Brother 40   Heart disease Brother    Alzheimer's disease Brother    Dementia Brother    Colon cancer Neg Hx    Esophageal cancer Neg Hx    Pancreatic cancer Neg Hx    Stomach cancer Neg Hx    Rectal cancer Neg Hx    BRCA 1/2 Neg Hx    Breast cancer Neg Hx    Cancer - Colon Neg Hx    Fibrocystic breast disease Neg Hx    Ovarian cancer Neg Hx    Endometrial cancer Neg Hx    Prostate cancer Neg Hx     SOCIAL HISTORY: Social History   Socioeconomic History   Marital status: Married    Spouse name: Tom   Number of children: 1   Years of education: Not on file   Highest education level: 12th grade  Occupational History   Occupation: retired    Comment: retired  Tobacco Use   Smoking status: Former    Current packs/day: 0.00    Average packs/day: 0.3 packs/day for 15.0 years (4.5 ttl pk-yrs)    Types: Cigarettes    Start date: 11/18/1970    Quit date: 11/17/1985    Years since quitting: 38.1   Smokeless tobacco: Never  Vaping Use   Vaping status: Never Used  Substance and Sexual Activity   Alcohol use: Yes    Alcohol/week: 14.0 standard drinks of alcohol    Types: 14 Glasses of wine per week    Comment: 2 glasses per night   Drug use: No   Sexual activity: Not Currently    Partners: Male  Other Topics Concern   Not on file  Social History Narrative   Exercise- Walks about 15-22 miles per week with womens group.   Social Drivers of Corporate investment banker Strain: Low Risk  (12/10/2023)   Overall Financial Resource Strain (CARDIA)    Difficulty of Paying Living Expenses: Not hard at all  Food Insecurity: No Food Insecurity (12/10/2023)   Hunger Vital Sign    Worried About Running Out of Food in the Last Year: Never true    Ran Out of Food in the Last Year: Never true  Transportation Needs: No Transportation Needs (12/10/2023)   PRAPARE - Administrator, Civil Service (Medical): No    Lack of Transportation  (Non-Medical): No  Physical Activity: Insufficiently  Active (12/10/2023)   Exercise Vital Sign    Days of Exercise per Week: 2 days    Minutes of Exercise per Session: 30 min  Stress: No Stress Concern Present (12/10/2023)   [employer] of Occupational Health - Occupational Stress Questionnaire    Feeling of Stress: Only a little  Social Connections: Socially Integrated (12/10/2023)   Social Connection and Isolation Panel    Frequency of Communication with Friends and Family: Three times a week    Frequency of Social Gatherings with Friends and Family: Twice a week    Attends Religious Services: 1 to 4 times per year    Active Member of [employer] or Organizations: Yes    Attends Engineer, structural: More than 4 times per year    Marital Status: Married  Catering manager Violence: Not At Risk (04/05/2023)   Humiliation, Afraid, Rape, and Kick questionnaire    Fear of Current or Ex-Partner: No    Emotionally Abused: No    Physically Abused: No    Sexually Abused: No    REVIEW OF SYSTEMS: New patient intake form was reviewed.  Complete 10-system review is negative except for the following: Urinary frequency, joint pain, abdominal distention, back pain, decreased concentration, abdominal pain  PHYSICAL EXAM: BP (!) 161/76 (BP Location: Left Arm, Patient Position: Sitting)   Pulse 64   Temp 98.1 F (36.7 C) (Oral)   Resp 19   Ht 5' 4 (1.626 m)   Wt 152 lb 6.4 oz (69.1 kg)   SpO2 100%   BMI 26.16 kg/m  Constitutional: No acute distress. Neuro/Psych: Alert, oriented.  Head and Neck: Normocephalic, atraumatic. Neck symmetric without masses. Sclera anicteric.  Respiratory: Normal work of breathing. Clear to auscultation bilaterally. Cardiovascular: Regular rate and rhythm, no murmurs, rubs, or gallops. Abdomen: Normoactive bowel sounds. Soft, non-distended, non-tender to palpation. No masses appreciated. Well healed right upper quadrant incision and laparoscopic  incisions Extremities: Grossly normal range of motion. Warm, well perfused. No edema bilaterally. Skin: No rashes or lesions. Lymphatic: No cervical, supraclavicular, or inguinal adenopathy. Genitourinary: External genitalia without lesions. Urethral meatus without lesions or prolapse. On speculum exam, vagina and cervix without lesions, atrophic mucosa. Bimanual exam reveals normal cervix and small mobile uterus.  No adnexal masses palpated. Exam chaperoned by Darice Sanes, RN   LABORATORY AND RADIOLOGIC DATA: Outside medical records were reviewed to synthesize the above history, along with the history and physical obtained during the visit.  Outside laboratory, pathology, and imaging reports were reviewed, with pertinent results below.  I personally reviewed the outside images.  WBC  Date Value Ref Range Status  12/12/2023 7.4 4.0 - 10.5 K/uL Final   Hemoglobin  Date Value Ref Range Status  12/12/2023 14.7 12.0 - 15.0 g/dL Final  95/90/7979 86.3 11.1 - 15.9 g/dL Final   HCT  Date Value Ref Range Status  12/12/2023 44.4 36.0 - 46.0 % Final   Hematocrit  Date Value Ref Range Status  06/29/2018 41.5 34.0 - 46.6 % Final   Platelets  Date Value Ref Range Status  12/12/2023 289.0 150.0 - 400.0 K/uL Final  06/29/2018 286 150 - 450 x10E3/uL Final   Magnesium  Date Value Ref Range Status  12/12/2023 2.1 1.5 - 2.5 mg/dL Final   Creat  Date Value Ref Range Status  12/10/2019 0.64 0.60 - 0.93 mg/dL Final    Comment:    For patients >58 years of age, the reference limit for Creatinine is approximately 13% higher for people identified  as African-American. .    Creatinine, Ser  Date Value Ref Range Status  12/12/2023 1.03 0.40 - 1.20 mg/dL Final   AST  Date Value Ref Range Status  12/12/2023 22 0 - 37 U/L Final   ALT  Date Value Ref Range Status  12/12/2023 14 0 - 35 U/L Final    MR Pelvis W Wo Contrast 01/03/2024  Narrative CLINICAL DATA:  Right adnexal mass identified  on previous ultrasound.  EXAM: MRI PELVIS WITHOUT AND WITH CONTRAST  TECHNIQUE: Multiplanar multisequence MR imaging of the pelvis was performed both before and after administration of intravenous contrast.  CONTRAST:  6.7mL GADAVIST GADOBUTROL 1 MMOL/ML IV SOLN  COMPARISON:  Pelvic ultrasound 12/29/2023  FINDINGS: Urinary Tract:  Bladder unremarkable.  Bowel:  Diverticular disease noted left colon.  Vascular/Lymphatic: Choose 1  Reproductive: Uterus measures 6.1 x 2.8 x 5.1 cm with several tiny fibroids evident. Endometrium does not appear thickened.  Left ovary measures 3.0 x 1.1 cm on axial imaging but is not well seen on coronal or sagittal pulse sequences. No left adnexal mass.  Right ovary is not discretely visible. As on the recent ultrasound, there is an elongated mass lesion in the right pelvis tracking along the pelvic sidewall from the adnexal space down towards the groin. This has a tubular configuration measuring on the order of 5.0 x 1.8 x 2.4 cm and demonstrates a small cystic component posteriorly with areas of enhancement. Cecal tip is low in the right pelvis.  Other:  No substantial ascites.  Musculoskeletal: No focal suspicious marrow enhancement within the visualized bony anatomy.  IMPRESSION: 1. As on the recent ultrasound, there is an elongated mass lesion in the right pelvis tracking along the pelvic sidewall from the adnexal space down towards the groin. Right ovary is not discretely visible. The lesion has a tubular configuration measuring on the order of 5.0 x 1.8 x 2.4 cm and demonstrates a small cystic component posteriorly with areas of enhancement. Broad ligament or round ligament fibroid would be consideration but less likely given patient age and lack of mass lesion in this region on previous CT imaging of 2022. Dilated fallopian tube a consideration although this was not suspected in ultrasound. Imaging features are nonspecific but  could be related to a primary ovarian neoplasm. Given proximity to the cecum, a dilated appendix not entirely excluded. Pelvic sidewall lymphadenopathy is a consideration. CT imaging with oral and intravenous contrast suggested for better spatial resolution. Close follow-up warranted as neoplasm a concern. 2. No left adnexal mass. 3. Several tiny uterine fibroids. 4. Left colonic diverticulosis.   Electronically Signed By: Camellia Candle M.D. On: 01/03/2024 10:11   CT Chest W Contrast 12/29/2023  Narrative EXAM: CT CHEST WITH CONTRAST 12/29/2023 04:56:53 PM  TECHNIQUE: CT of the chest was performed with the administration of intravenous contrast. 75 cc Omnipaque  300 was administered. Multiplanar reformatted images are provided for review. Automated exposure control, iterative reconstruction, and/or weight based adjustment of the mA/kV was utilized to reduce the radiation dose to as low as reasonably achievable.  COMPARISON: Chest radiograph dated 07/14/2019.  CLINICAL HISTORY: Elevated amylase. 78 y/o female with chronic elevated amylase and fatigue.  FINDINGS:  MEDIASTINUM: Left anterior descending and left circumflex coronary atherosclerosis. Pericardium is unremarkable. Atherosclerotic thoracic aorta with dilated 4.2 cm ascending thoracic aorta. The central airways are clear.  LYMPH NODES: No mediastinal, hilar or axillary lymphadenopathy.  LUNGS AND PLEURA: No focal consolidation. No significant pulmonary nodules. No pleural effusion or pneumothorax.  SOFT TISSUES/BONES: Chronic-appearing severe T7, mild T8, and mild T12 vertebral compression fractures. Moderate thoracic spondylosis. No acute abnormality of the soft tissues.  UPPER ABDOMEN: Limited images of the upper abdomen demonstrates no acute abnormality.  IMPRESSION: 1. No acute findings. 2. Dilated 4.2 cm ascending thoracic aorta. Follow-up chest CT angiogram suggested in 12 months. 3. Two  vessel coronary atherosclerosis. 4. Chronic-appearing severe T7 and mild T8/T12 vertebral compression fractures.  Electronically signed by: Selinda Blue MD 12/30/2023 03:44 PM EDT RP Workstation: [id number]   US  Pelvic Complete With Transvaginal 12/29/2023  Narrative EXAM: US  Pelvis, Complete Transvaginal and Transabdominal without Doppler  TECHNIQUE: Transabdominal and transvaginal pelvic duplex ultrasound using B-mode/gray scaled imaging without Doppler spectral analysis and color flow was obtained.  COMPARISON: CT abdomen/pelvis dated 03/06/2021 and pelvic sonogram dated 04/10/2020.  CLINICAL HISTORY: pelvic pain, elevated amylase. RLQ pain/discomfort 3-4 months, worsening  FINDINGS:  UTERUS: Uterus measures 7.1 x 2.2 x 4.6 cm, uterine volume 37 cc. Normal size uterus. Coarsely calcified intramural 0.9 cm right uterine body fibroid. No additional uterine fibroids.  ENDOMETRIAL STRIPE: Endometrial measures 2 mm. No endometrial cavity fluid or focal endometrial mass.  RIGHT OVARY: There is an apparent ovoid 5.7 x 2.5 x 1.9 cm right adnexal mass, predominantly solid with an internal 1.8 cm cystic focus, not previously visualized on 04/10/2020 pelvic sonogram or 03/06/2021 CT abdomen/pelvis study.  LEFT OVARY: Left ovary not visualized. No left adnexal masses.  FREE FLUID: No free fluid in the pelvis.  IMPRESSION: 1. Apparent ovoid right adnexal mass measuring 5.7 x 2.5 x 1.9 cm with 1.8 cm cystic component, new from prior studies. Right ovarian neoplasm not excluded. Further evaluation with MRI pelvis without and with IV contrast is recommended at this time. 2. Nonvisualization of the left ovary. 3. Unremarkable small postmenopausal uterus.  These results will be called to the ordering clinician or representative by the Radiologist Assistant, and communication documented in the PACS or [employer].  Electronically signed by: Selinda Blue MD 12/30/2023 07:28  PM EDT RP Workstation: [id number]

## 2024-01-09 NOTE — Telephone Encounter (Signed)
 Per Dr [name] fax surgical optimization form to the patient's PCP office [place], [place] [phone])

## 2024-01-10 ENCOUNTER — Telehealth: Payer: Self-pay | Admitting: *Deleted

## 2024-01-10 LAB — CA 125: Cancer Antigen (CA) 125: 27.9 U/mL (ref 0.0–38.1)

## 2024-01-10 LAB — CANCER ANTIGEN 19-9: CA 19-9: 6 U/mL (ref 0–35)

## 2024-01-10 NOTE — Progress Notes (Signed)
Pt needs preop appt please

## 2024-01-10 NOTE — Telephone Encounter (Signed)
 I did call the pt. she didn't answer so I left a voicemail

## 2024-01-10 NOTE — Telephone Encounter (Signed)
 Copied from CRM [id number]. Topic: General - Other >> Jan 10, 2024  9:54 AM Burnard DEL wrote: Reason for CRM: Patient called in stating that she received a call from office to get schedule for an appointment with Dr Antonio Meth.There was no documentation of anyone calling her.

## 2024-01-12 ENCOUNTER — Ambulatory Visit (HOSPITAL_BASED_OUTPATIENT_CLINIC_OR_DEPARTMENT_OTHER)
Admission: RE | Admit: 2024-01-12 | Discharge: 2024-01-12 | Disposition: A | Discharge status: Home / Self Care | Source: Ambulatory Visit | Attending: Psychiatry | Admitting: Psychiatry

## 2024-01-12 DIAGNOSIS — N9489 Other specified conditions associated with female genital organs and menstrual cycle: Secondary | ICD-10-CM | POA: Diagnosis not present

## 2024-01-12 DIAGNOSIS — K5730 Diverticulosis of large intestine without perforation or abscess without bleeding: Secondary | ICD-10-CM | POA: Diagnosis not present

## 2024-01-12 MED ORDER — IOHEXOL 300 MG/ML  SOLN
80.0000 mL | Freq: Once | INTRAMUSCULAR | Status: AC | PRN
Start: 2024-01-12 — End: 2024-01-12
  Administered 2024-01-12: 80 mL via INTRAVENOUS

## 2024-01-16 ENCOUNTER — Telehealth: Payer: Self-pay | Admitting: Psychiatry

## 2024-01-16 NOTE — Telephone Encounter (Signed)
 Letter mailed

## 2024-01-16 NOTE — Telephone Encounter (Signed)
 Called pt with CT scan results. Trying to reach out to our general surgeon colleagues to get their thoughts on the imaging and if we should proceed with surgery as scheduled if someone is available to look if this is confirmed to be an appendiceal mass versus if we should cancel surgery and refer her out. Will close the loop with the patient when I have further information.

## 2024-01-17 ENCOUNTER — Telehealth: Payer: Self-pay | Admitting: Psychiatry

## 2024-01-17 NOTE — Telephone Encounter (Signed)
 Called patient with update.  Reviewed her case with Dr. Stevie who is the DOW next week.  He is happy to come take a look if the mass is confirmed intraoperatively to be an appendiceal mass.  We will proceed on Tuesday as scheduled.  Reviewed that if this is the appendix, then we would not perform pelvic surgery unless the pelvic anatomy is involved.  Patient understanding with plan and in agreement

## 2024-01-19 ENCOUNTER — Ambulatory Visit (INDEPENDENT_AMBULATORY_CARE_PROVIDER_SITE_OTHER): Admitting: Family Medicine

## 2024-01-19 ENCOUNTER — Encounter: Payer: Self-pay | Admitting: Family Medicine

## 2024-01-19 VITALS — BP 140/80 | HR 74 | Temp 98.3°F | Resp 18 | Ht 64.0 in | Wt 154.8 lb

## 2024-01-19 DIAGNOSIS — Z01818 Encounter for other preprocedural examination: Secondary | ICD-10-CM

## 2024-01-19 NOTE — Progress Notes (Signed)
 COVID Vaccine received:  []  No [x]  Yes Date of any COVID positive Test in last 90 days:  PCP -  Jamee Schultze, MD  Cardiologist - Candyce Reek, MD (LOV 02-04-2020)  Chest x-ray - CT Chest  12-29-2023  Epic EKG - 01-19-24 at PCP office. Scanned to Epic  Stress Test -  ECHO - 12-13-2019  Cardiac Cath -  CT Coronary Calcium  score:   Bowel Prep - []  No  []   Yes ______  Pacemaker / ICD device [x]  No []  Yes   Spinal Cord Stimulator:[x]  No []  Yes       History of Sleep Apnea? [x]  No []  Yes   CPAP used?- [x]  No []  Yes    Patient has: [x]  NO Hx DM   []  Pre-DM   []  DM1  []   DM2 Does the patient monitor blood sugar?   [x]  N/A   []  No []  Yes  Last A1c was: 5.4  on    04-10-2020   Blood Thinner / Instructions: none Aspirin Instructions:  none  Comments:   Activity level: Able to walk up 2 flights of stairs without becoming significantly short of breath or having chest pain?  []  No   []    Yes  Patient can perform ADLs without assistance. []  No   []   Yes  Anesthesia review: HTN, GAD, Bradycardia, Palps- PACs & PVCs, GERD, PONV  Patient denies any S&S of respiratory illness or Covid - no shortness of breath, fever, cough or chest pain at PAT appointment.  Patient verbalized understanding and agreement to the Pre-Surgical Instructions that were given to them at this PAT appointment. Patient was also educated of the need to review these PAT instructions again prior to her surgery.I reviewed the appropriate phone numbers to call if they have any and questions or concerns.

## 2024-01-19 NOTE — Patient Instructions (Signed)
 SURGICAL WAITING ROOM VISITATION Patients having surgery or a procedure may have no more than 2 support people in the waiting area - these visitors may rotate in the visitor waiting room.   If the patient needs to stay at the hospital during part of their recovery, the visitor guidelines for inpatient rooms apply.  PRE-OP VISITATION  Pre-op nurse will coordinate an appropriate time for 1 support person to accompany the patient in pre-op.  This support person may not rotate.  This visitor will be contacted when the time is appropriate for the visitor to come back in the pre-op area.  Please refer to the [facility] website for the visitor guidelines for Inpatients (after your surgery is over and you are in a regular room).  You are not required to quarantine at this time prior to your surgery. However, you must do this: Hand Hygiene often Do NOT share personal items Notify your provider if you are in close contact with someone who has COVID or you develop fever 100.4 or greater, new onset of sneezing, cough, sore throat, shortness of breath or body aches.  If you test positive for Covid or have been in contact with anyone that has tested positive in the last 10 days please notify you surgeon.    Your procedure is scheduled on:  Tuesday  January 24, 2024  Report to [facility] Main Entrance: [place] entrance where the [employer] is available.   Report to admitting at: 08:45    AM  Call this number if you have any questions or problems the morning of surgery [phone]  FOLLOW ANY ADDITIONAL PRE OP INSTRUCTIONS YOU RECEIVED FROM YOUR SURGEON'S OFFICE!!!  Eat a light diet the day before surgery.  Examples including soups, broths, toast, yogurt, mashed potatoes.  AVOID GAS PRODUCING FOODS. Things to avoid include carbonated beverages (fizzy beverages, sodas), raw fruits and raw vegetables (uncooked), or beans.    Do not eat food after Midnight the night prior to your  surgery/procedure.  After Midnight you may have the following liquids until   08:00 AM  DAY OF SURGERY  Clear Liquid Diet Water Black Coffee (sugar ok, NO MILK/CREAM OR CREAMERS)  Tea (sugar ok, NO MILK/CREAM OR CREAMERS) regular and decaf                             Plain Jell-O  with no fruit (NO RED)                                           Fruit ices (not with fruit pulp, NO RED)                                     Popsicles (NO RED)                                                                  Juice: NO CITRUS JUICES: only apple, WHITE grape, WHITE cranberry Sports drinks like Gatorade or Powerade (NO RED)  Oral Hygiene is also important to reduce your risk of infection.        Remember - BRUSH YOUR TEETH THE MORNING OF SURGERY WITH YOUR REGULAR TOOTHPASTE  Do NOT smoke after Midnight the night before surgery.  STOP TAKING all Vitamins, Herbs and supplements 1 week before your surgery.   Take ONLY these medicines the morning of surgery with A SIP OF WATER: Citalopram     DO NOT TAKE LOSARTAN  the morning of your surgery.   You may not have any metal on your body including hair pins, jewelry, and body piercing  Do not wear make-up, lotions, powders, perfumes  or deodorant  Do not wear nail polish including gel and S&S, artificial / acrylic nails, or any other type of covering on natural nails including finger and toenails. If you have artificial nails, gel coating, etc., that needs to be removed by a nail salon, Please have this removed prior to surgery. Not doing so may mean that your surgery could be cancelled or delayed if the Surgeon or anesthesia staff feels like they are unable to monitor you safely.   Do not shave 48 hours prior to surgery to avoid nicks in your skin which may contribute to postoperative infections.    Contacts, Hearing Aids, dentures or bridgework may not be worn into surgery. DENTURES WILL BE REMOVED PRIOR TO SURGERY PLEASE DO NOT  APPLY Poly grip OR ADHESIVES!!!  Patients discharged on the day of surgery will not be allowed to drive home.  Someone NEEDS to stay with you for the first 24 hours after anesthesia.  Do not bring your home medications to the hospital. The Pharmacy will dispense medications listed on your medication list to you during your admission in the Hospital.  Please read over the following fact sheets you were given: IF YOU HAVE QUESTIONS ABOUT YOUR PRE-OP INSTRUCTIONS, PLEASE CALL [phone]     [facility] - Preparing for Surgery        Before surgery, you can play an important role.  Because skin is not sterile, your skin needs to be as free of germs as possible.  You can reduce the number of germs on your skin by washing with CHG (chlorahexidine gluconate) soap before surgery.  CHG is an antiseptic cleaner which kills germs and bonds with the skin to continue killing germs even after washing. Please DO NOT use if you have an allergy to CHG or antibacterial soaps.  If your skin becomes reddened/irritated stop using the CHG and inform your nurse when you arrive at Short Stay. Do not shave (including legs and underarms) for at least 48 hours prior to the first CHG shower.  You may shave your face/neck.  Please follow these instructions carefully:  1.  Shower with CHG Soap the night before surgery ONLY (DO NOT USE THE CHG SOAP THE MORNING OF SURGERY).  2.  If you choose to wash your hair, wash your hair first as usual with your normal  shampoo.  3.  After you shampoo, rinse your hair and body thoroughly to remove the shampoo.                             4.  Use CHG as you would any other liquid soap.  You can apply chg directly to the skin and wash.  Gently with a scrungie or clean washcloth.  5.  Apply the CHG Soap to your body ONLY FROM THE NECK  DOWN.   Do not use on face/ open                           Wound or open sores. Avoid contact with eyes, ears mouth and genitals (private parts).                        Wash face,  Genitals (private parts) with your normal soap.             6.  Wash thoroughly, paying special attention to the area where your  surgery  will be performed.  7.  Thoroughly rinse your body with warm water from the neck down.  8.  DO NOT shower/wash with your normal soap after using and rinsing off the CHG Soap.                9.  Pat yourself dry with a clean towel.            10.  Wear clean pajamas.            11.  Place clean sheets on your bed the night of your first shower and do not  sleep with pets.  Day of Surgery : Do not apply any CHG, lotions/deodorants the morning of surgery.  Please wear clean clothes to the hospital/surgery center.   FAILURE TO FOLLOW THESE INSTRUCTIONS MAY RESULT IN THE CANCELLATION OF YOUR SURGERY  PATIENT SIGNATURE_________________________________  NURSE SIGNATURE__________________________________  ________________________________________________________________________

## 2024-01-19 NOTE — Progress Notes (Signed)
 Subjective:    Patient ID: Kristen Schultz, female    DOB: 1945/12/23, 78 y.o.   MRN: [id number]  Chief Complaint  Patient presents with   Pre-op Exam    Surgery on 01/24/2024    HPI Patient is in today for pre op exam.  Discussed the use of AI scribe software for clinical note transcription with the patient, who gave verbal consent to proceed.  History of Present Illness Kristen Schultz is a 78 year old female who presents with concerns about a possible appendiceal mass and recent diagnostic imaging findings.  She was surprised by the rapid progression of her medical evaluation after an ultrasound revealed a mass. Subsequent imaging, including an MRI and a CT scan, suggested a possible appendiceal mass. There was also mention of diverticulosis without inflammation.  She has undergone multiple tests over the past two weeks, including blood tests that were negative for cancer antigens prior to the CT scan. Two years ago, she experienced similar pain, but previous evaluations did not reveal any abnormalities. The pain had subsided at that time, leading to no further investigation. Recently, the pain returned, prompting the current workup.  She has a history of right lower quadrant pain from childhood, which was previously thought to be appendiceal but was later identified as an ovarian cyst.  She has a chipped tooth that was recently replaced and requests the use of a mouth guard during intubation for her upcoming surgery. She has not had any known issues with anesthesia in the past but experienced nausea, which she attributes to pain medication.    Past Medical History:  Diagnosis Date   Anxiety    Cellulitis of chin 11/17/2020   Colon polyps    COVID-19 11/17/2020   Gastric ulcer    per patient; in the 1970s   GERD (gastroesophageal reflux disease)    H. pylori infection    Heart murmur    Hyperlipidemia    no treatment per pt   Hypertension    Liver cyst     Ovarian cyst 1984   Pneumonia     Past Surgical History:  Procedure Laterality Date   COLONOSCOPY  02/10/2016   DENTAL SURGERY  2000   dental implant   DENTAL SURGERY  2021   2 molars taken out    ESOPHAGOGASTRODUODENOSCOPY  11/06/2018   [facility]    ESOPHAGOGASTRODUODENOSCOPY  07/23/2019   [facility]    EUS  07/23/2019   [facility]    HEMORRHOID SURGERY     LAPAROSCOPIC LIVER CYST REMOVAL     at 78 years old   OVARIAN CYST SURGERY Right    benign    Family History  Problem Relation Age of Onset   Heart disease Father    Heart failure Sister    Heart disease Sister    Dementia Sister    Heart disease Brother 62   Heart disease Brother    Alzheimer's disease Brother    Dementia Brother    Colon cancer Neg Hx    Esophageal cancer Neg Hx    Pancreatic cancer Neg Hx    Stomach cancer Neg Hx    Rectal cancer Neg Hx    BRCA 1/2 Neg Hx    Breast cancer Neg Hx    Cancer - Colon Neg Hx    Fibrocystic breast disease Neg Hx    Ovarian cancer Neg Hx    Endometrial cancer Neg Hx  Prostate cancer Neg Hx     Social History   Socioeconomic History   Marital status: Married    Spouse name: Tom   Number of children: 1   Years of education: Not on file   Highest education level: Some college, no degree  Occupational History   Occupation: retired    Comment: retired  Tobacco Use   Smoking status: Former    Current packs/day: 0.00    Average packs/day: 0.3 packs/day for 15.0 years (4.5 ttl pk-yrs)    Types: Cigarettes    Start date: 11/18/1970    Quit date: 11/17/1985    Years since quitting: 38.1   Smokeless tobacco: Never  Vaping Use   Vaping status: Never Used  Substance and Sexual Activity   Alcohol use: Yes    Alcohol/week: 14.0 standard drinks of alcohol    Types: 14 Glasses of wine per week    Comment: 2 glasses per night   Drug use: No   Sexual activity: Not Currently    Partners: Male  Other Topics Concern   Not on file   Social History Narrative   Exercise- Walks about 15-22 miles per week with womens group.   Social Drivers of Corporate Investment Banker Strain: Low Risk  (01/16/2024)   Overall Financial Resource Strain (CARDIA)    Difficulty of Paying Living Expenses: Not hard at all  Food Insecurity: No Food Insecurity (01/16/2024)   Hunger Vital Sign    Worried About Running Out of Food in the Last Year: Never true    Ran Out of Food in the Last Year: Never true  Transportation Needs: No Transportation Needs (01/16/2024)   PRAPARE - Administrator, Civil Service (Medical): No    Lack of Transportation (Non-Medical): No  Physical Activity: Insufficiently Active (01/16/2024)   Exercise Vital Sign    Days of Exercise per Week: 3 days    Minutes of Exercise per Session: 30 min  Stress: No Stress Concern Present (01/16/2024)   [employer] of Occupational Health - Occupational Stress Questionnaire    Feeling of Stress: Not at all  Social Connections: Socially Integrated (01/16/2024)   Social Connection and Isolation Panel    Frequency of Communication with Friends and Family: More than three times a week    Frequency of Social Gatherings with Friends and Family: Twice a week    Attends Religious Services: 1 to 4 times per year    Active Member of [employer] or Organizations: Yes    Attends Engineer, Structural: More than 4 times per year    Marital Status: Married  Catering Manager Violence: Not At Risk (04/05/2023)   Humiliation, Afraid, Rape, and Kick questionnaire    Fear of Current or Ex-Partner: No    Emotionally Abused: No    Physically Abused: No    Sexually Abused: No    Outpatient Medications Prior to Visit  Medication Sig Dispense Refill   citalopram  (CELEXA ) 10 MG tablet 1 1/2 po every day (Patient taking differently: Take 15 mg by mouth in the morning.) 135 tablet 1   losartan  (COZAAR ) 50 MG tablet Take 1 tablet (50 mg total) by mouth daily. 90 tablet 1    Multiple Vitamin (MULTIVITAMIN WITH MINERALS) TABS tablet Take 1 tablet by mouth in the morning. Women's Advanced 50+ Multivitamin     senna-docusate (SENOKOT-S) 8.6-50 MG tablet Take 2 tablets by mouth at bedtime. For AFTER surgery, do not take if having diarrhea 30 tablet 0  traMADol  (ULTRAM ) 50 MG tablet Take 1 tablet (50 mg total) by mouth every 6 (six) hours as needed for moderate pain (pain score 4-6). For AFTER surgery only, do not take and drive 10 tablet 0   No facility-administered medications prior to visit.    Allergies  Allergen Reactions   Bee Venom Anaphylaxis    Bee stings   Codeine     Extreme Vomiting   Other Other (See Comments)    CDN - reaction unknown   Penicillins     Redness, swelling and itching Can take Keflex    Tetracyclines & Related     Yeast Infection and Stomach problems   Xylocaine Dental [Lidocaine-Epinephrine (Pf)]     Review of Systems  Constitutional:  Negative for fever and malaise/fatigue.  HENT:  Negative for congestion.   Eyes:  Negative for blurred vision.  Respiratory:  Negative for shortness of breath.   Cardiovascular:  Negative for chest pain, palpitations and leg swelling.  Gastrointestinal:  Negative for abdominal pain, blood in stool and nausea.       Pelvic pain  Genitourinary:  Negative for dysuria and frequency.  Musculoskeletal:  Negative for falls.  Skin:  Negative for rash.  Neurological:  Negative for dizziness, loss of consciousness and headaches.  Endo/Heme/Allergies:  Negative for environmental allergies.  Psychiatric/Behavioral:  Negative for depression. The patient is not nervous/anxious.        Objective:    Physical Exam Vitals and nursing note reviewed.  Constitutional:      General: She is not in acute distress.    Appearance: Normal appearance. She is well-developed.  HENT:     Head: Normocephalic and atraumatic.  Eyes:     General: No scleral icterus.       Right eye: No discharge.        Left eye:  No discharge.  Cardiovascular:     Rate and Rhythm: Normal rate and regular rhythm.     Heart sounds: No murmur heard. Pulmonary:     Effort: Pulmonary effort is normal. No respiratory distress.     Breath sounds: Normal breath sounds.  Abdominal:     Tenderness: There is abdominal tenderness in the suprapubic area. There is no guarding or rebound.  Musculoskeletal:        General: Normal range of motion.     Cervical back: Normal range of motion and neck supple.     Right lower leg: No edema.     Left lower leg: No edema.  Skin:    General: Skin is warm and dry.  Neurological:     Mental Status: She is alert and oriented to person, place, and time.  Psychiatric:        Mood and Affect: Mood normal.        Behavior: Behavior normal.        Thought Content: Thought content normal.        Judgment: Judgment normal.     BP (!) 140/80 (BP Location: Left Arm, Patient Position: Sitting, Cuff Size: Normal)   Pulse 74   Temp 98.3 F (36.8 C) (Oral)   Resp 18   Ht 5' 4 (1.626 m)   Wt 154 lb 12.8 oz (70.2 kg)   SpO2 97%   BMI 26.57 kg/m  Wt Readings from Last 3 Encounters:  01/19/24 154 lb 12.8 oz (70.2 kg)  01/09/24 152 lb 6.4 oz (69.1 kg)  12/12/23 148 lb 12.8 oz (67.5 kg)    Diabetic Foot Exam -  Simple   No data filed    Lab Results  Component Value Date   WBC 7.4 12/12/2023   HGB 14.7 12/12/2023   HCT 44.4 12/12/2023   PLT 289.0 12/12/2023   GLUCOSE 90 01/09/2024   CHOL 199 08/11/2023   TRIG 75.0 08/11/2023   HDL 65.60 08/11/2023   LDLCALC 118 (H) 08/11/2023   ALT 18 01/09/2024   AST 27 01/09/2024   NA 138 01/09/2024   K 4.1 01/09/2024   CL 101 01/09/2024   CREATININE 0.64 01/09/2024   BUN 13 01/09/2024   CO2 29 01/09/2024   TSH 1.67 12/12/2023   HGBA1C 5.4 04/10/2020    Lab Results  Component Value Date   TSH 1.67 12/12/2023   Lab Results  Component Value Date   WBC 7.4 12/12/2023   HGB 14.7 12/12/2023   HCT 44.4 12/12/2023   MCV 95.7  12/12/2023   PLT 289.0 12/12/2023   Lab Results  Component Value Date   NA 138 01/09/2024   K 4.1 01/09/2024   CO2 29 01/09/2024   GLUCOSE 90 01/09/2024   BUN 13 01/09/2024   CREATININE 0.64 01/09/2024   BILITOT 0.8 01/09/2024   ALKPHOS 62 01/09/2024   AST 27 01/09/2024   ALT 18 01/09/2024   PROT 7.8 01/09/2024   ALBUMIN 4.4 01/09/2024   CALCIUM  10.2 01/09/2024   ANIONGAP 8 01/09/2024   GFR 52.35 (L) 12/12/2023   Lab Results  Component Value Date   CHOL 199 08/11/2023   Lab Results  Component Value Date   HDL 65.60 08/11/2023   Lab Results  Component Value Date   LDLCALC 118 (H) 08/11/2023   Lab Results  Component Value Date   TRIG 75.0 08/11/2023   Lab Results  Component Value Date   CHOLHDL 3 08/11/2023   Lab Results  Component Value Date   HGBA1C 5.4 04/10/2020   EKG ---- sinus , no change since 11/2019    Assessment & Plan:  Pre-op exam -     EKG 12-Lead  Assessment and Plan---  pt cleared for surgery Assessment & Plan Appendiceal mass with luminal obstruction, 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 Bowel involvement is possible, but no colostomy is needed at this time. She is concerned about a potential colostomy, which will be determined during surgery. A general surgeon may be involved if the ovaries are not the source. Proceed with surgical consultation and possible surgery on Tuesday. Complete preoperative labs and anesthesiology consultation. Discuss potential nausea with the anesthesiologist and request prophylactic treatment. Request a mouth guard during intubation to protect dental work.  Diverticulosis of colon   Imaging shows diverticulosis with pockets in the colon without current inflammation, indicating no diverticulitis. Monitor for symptoms of diverticulitis, such as abdominal pain.    Jimel Myler R Lowne  Chase, DO

## 2024-01-20 ENCOUNTER — Other Ambulatory Visit: Payer: Self-pay

## 2024-01-20 ENCOUNTER — Encounter (HOSPITAL_COMMUNITY): Payer: Self-pay

## 2024-01-20 ENCOUNTER — Encounter (HOSPITAL_COMMUNITY)
Admission: RE | Admit: 2024-01-20 | Discharge: 2024-01-20 | Disposition: A | Discharge status: Home / Self Care | Source: Ambulatory Visit | Attending: Psychiatry | Admitting: Psychiatry

## 2024-01-20 VITALS — BP 160/80 | HR 64 | Temp 97.9°F | Resp 16 | Ht 64.0 in | Wt 154.0 lb

## 2024-01-20 DIAGNOSIS — Z01812 Encounter for preprocedural laboratory examination: Secondary | ICD-10-CM | POA: Diagnosis present

## 2024-01-20 DIAGNOSIS — F411 Generalized anxiety disorder: Secondary | ICD-10-CM

## 2024-01-20 DIAGNOSIS — Z01818 Encounter for other preprocedural examination: Secondary | ICD-10-CM

## 2024-01-20 DIAGNOSIS — I10 Essential (primary) hypertension: Secondary | ICD-10-CM | POA: Insufficient documentation

## 2024-01-20 DIAGNOSIS — N9489 Other specified conditions associated with female genital organs and menstrual cycle: Secondary | ICD-10-CM | POA: Diagnosis not present

## 2024-01-20 HISTORY — DX: Stress incontinence (female) (male): N39.3

## 2024-01-20 HISTORY — DX: Other complications of anesthesia, initial encounter: T88.59XA

## 2024-01-20 LAB — BASIC METABOLIC PANEL WITH GFR
Anion gap: 10 (ref 5–15)
BUN: 14 mg/dL (ref 8–23)
CO2: 26 mmol/L (ref 22–32)
Calcium: 9.6 mg/dL (ref 8.9–10.3)
Chloride: 103 mmol/L (ref 98–111)
Creatinine, Ser: 0.74 mg/dL (ref 0.44–1.00)
GFR, Estimated: >60 mL/min (ref >60)
Glucose, Bld: 90 mg/dL (ref 70–99)
Potassium: 5 mmol/L (ref 3.5–5.1)
Sodium: 139 mmol/L (ref 135–145)

## 2024-01-20 LAB — CBC
HCT: 43.5 % (ref 36.0–46.0)
Hemoglobin: 14.1 g/dL (ref 12.0–15.0)
MCH: 30.9 pg (ref 26.0–34.0)
MCHC: 32.4 g/dL (ref 30.0–36.0)
MCV: 95.2 fL (ref 80.0–100.0)
Platelets: 249 K/uL (ref 150–400)
RBC: 4.57 MIL/uL (ref 3.87–5.11)
RDW: 13.2 % (ref 11.5–15.5)
WBC: 6.9 K/uL (ref 4.0–10.5)
nRBC: 0 % (ref 0.0–0.2)

## 2024-01-20 LAB — TYPE AND SCREEN
ABO/RH(D): O POS
Antibody Screen: NEGATIVE

## 2024-01-23 ENCOUNTER — Telehealth: Payer: Self-pay | Admitting: *Deleted

## 2024-01-23 NOTE — Telephone Encounter (Signed)
 Received PCP clearance.

## 2024-01-23 NOTE — Telephone Encounter (Signed)
 Telephone call to check on pre-operative status.  Patient compliant with pre-operative instructions.  Reinforced nothing to eat after midnight. Clear liquids until 0758. Patient to arrive at 43.  No questions or concerns voiced.  Instructed to call for any needs.

## 2024-01-24 ENCOUNTER — Encounter (HOSPITAL_COMMUNITY): Payer: Self-pay | Admitting: Psychiatry

## 2024-01-24 ENCOUNTER — Ambulatory Visit (HOSPITAL_COMMUNITY): Admitting: Anesthesiology

## 2024-01-24 ENCOUNTER — Ambulatory Visit (HOSPITAL_COMMUNITY)
Admission: RE | Admit: 2024-01-24 | Discharge: 2024-01-24 | Disposition: A | Discharge status: Home / Self Care | Attending: Psychiatry | Admitting: Psychiatry

## 2024-01-24 ENCOUNTER — Encounter (HOSPITAL_COMMUNITY)
Admission: RE | Disposition: A | Discharge status: Home / Self Care | Payer: Self-pay | Source: Home / Self Care | Attending: Psychiatry

## 2024-01-24 ENCOUNTER — Other Ambulatory Visit: Payer: Self-pay

## 2024-01-24 DIAGNOSIS — I77810 Thoracic aortic ectasia: Secondary | ICD-10-CM | POA: Diagnosis not present

## 2024-01-24 DIAGNOSIS — Z87891 Personal history of nicotine dependence: Secondary | ICD-10-CM | POA: Insufficient documentation

## 2024-01-24 DIAGNOSIS — I2510 Atherosclerotic heart disease of native coronary artery without angina pectoris: Secondary | ICD-10-CM | POA: Diagnosis not present

## 2024-01-24 DIAGNOSIS — M1990 Unspecified osteoarthritis, unspecified site: Secondary | ICD-10-CM | POA: Diagnosis not present

## 2024-01-24 DIAGNOSIS — K388 Other specified diseases of appendix: Secondary | ICD-10-CM | POA: Diagnosis not present

## 2024-01-24 DIAGNOSIS — K219 Gastro-esophageal reflux disease without esophagitis: Secondary | ICD-10-CM | POA: Insufficient documentation

## 2024-01-24 DIAGNOSIS — I10 Essential (primary) hypertension: Secondary | ICD-10-CM | POA: Diagnosis not present

## 2024-01-24 DIAGNOSIS — F419 Anxiety disorder, unspecified: Secondary | ICD-10-CM | POA: Insufficient documentation

## 2024-01-24 DIAGNOSIS — Z79899 Other long term (current) drug therapy: Secondary | ICD-10-CM | POA: Diagnosis not present

## 2024-01-24 DIAGNOSIS — D121 Benign neoplasm of appendix: Secondary | ICD-10-CM | POA: Diagnosis not present

## 2024-01-24 DIAGNOSIS — K279 Peptic ulcer, site unspecified, unspecified as acute or chronic, without hemorrhage or perforation: Secondary | ICD-10-CM | POA: Diagnosis not present

## 2024-01-24 DIAGNOSIS — D259 Leiomyoma of uterus, unspecified: Secondary | ICD-10-CM | POA: Diagnosis not present

## 2024-01-24 DIAGNOSIS — N9489 Other specified conditions associated with female genital organs and menstrual cycle: Secondary | ICD-10-CM

## 2024-01-24 DIAGNOSIS — C181 Malignant neoplasm of appendix: Secondary | ICD-10-CM | POA: Diagnosis not present

## 2024-01-24 DIAGNOSIS — K5730 Diverticulosis of large intestine without perforation or abscess without bleeding: Secondary | ICD-10-CM | POA: Insufficient documentation

## 2024-01-24 HISTORY — PX: LAPAROSCOPIC APPENDECTOMY: SHX408

## 2024-01-24 LAB — ABO/RH: ABO/RH(D): O POS

## 2024-01-24 SURGERY — APPENDECTOMY, LAPAROSCOPIC
Anesthesia: General | Site: Abdomen

## 2024-01-24 MED ORDER — OXYCODONE HCL 5 MG PO TABS: 5.0000 mg | ORAL_TABLET | Freq: Once | ORAL | Status: DC | PRN

## 2024-01-24 MED ORDER — ACETAMINOPHEN 500 MG PO TABS
1000.0000 mg | ORAL_TABLET | ORAL | Status: AC
  Administered 2024-01-24: 1000 mg via ORAL
  Filled 2024-01-24: qty 2

## 2024-01-24 MED ORDER — DROPERIDOL 2.5 MG/ML IJ SOLN: 0.6250 mg | Freq: Once | INTRAMUSCULAR | Status: DC | PRN

## 2024-01-24 MED ORDER — POVIDONE-IODINE 10 % EX SWAB: 2.0000 | Freq: Once | CUTANEOUS | Status: DC

## 2024-01-24 MED ORDER — BUPIVACAINE HCL (PF) 0.25 % IJ SOLN
INTRAMUSCULAR | Status: AC
  Filled 2024-01-24: qty 30

## 2024-01-24 MED ORDER — OXYCODONE HCL 5 MG/5ML PO SOLN: 5.0000 mg | Freq: Once | ORAL | Status: DC | PRN

## 2024-01-24 MED ORDER — LIDOCAINE HCL (CARDIAC) PF 100 MG/5ML IV SOSY
PREFILLED_SYRINGE | INTRAVENOUS | Status: DC | PRN
  Administered 2024-01-24: 60 mg via INTRAVENOUS

## 2024-01-24 MED ORDER — FENTANYL CITRATE (PF) 100 MCG/2ML IJ SOLN
INTRAMUSCULAR | Status: DC | PRN
  Administered 2024-01-24 (×4): 50 ug via INTRAVENOUS

## 2024-01-24 MED ORDER — CHLORHEXIDINE GLUCONATE 0.12 % MT SOLN
15.0000 mL | Freq: Once | OROMUCOSAL | Status: AC
  Administered 2024-01-24: 15 mL via OROMUCOSAL

## 2024-01-24 MED ORDER — ROCURONIUM BROMIDE 10 MG/ML (PF) SYRINGE
PREFILLED_SYRINGE | INTRAVENOUS | Status: AC
  Filled 2024-01-24: qty 10

## 2024-01-24 MED ORDER — HEPARIN SODIUM (PORCINE) 5000 UNIT/ML IJ SOLN
5000.0000 [IU] | INTRAMUSCULAR | Status: AC
  Administered 2024-01-24: 5000 [IU] via SUBCUTANEOUS
  Filled 2024-01-24: qty 1

## 2024-01-24 MED ORDER — PROPOFOL 10 MG/ML IV BOLUS
INTRAVENOUS | Status: AC
  Filled 2024-01-24: qty 20

## 2024-01-24 MED ORDER — STERILE WATER FOR IRRIGATION IR SOLN
Status: DC | PRN
  Administered 2024-01-24: 1000 mL

## 2024-01-24 MED ORDER — PROPOFOL 500 MG/50ML IV EMUL
INTRAVENOUS | Status: AC
  Filled 2024-01-24: qty 50

## 2024-01-24 MED ORDER — LACTATED RINGERS IV SOLN: INTRAVENOUS | Status: DC | PRN

## 2024-01-24 MED ORDER — ONDANSETRON HCL 4 MG/2ML IJ SOLN
INTRAMUSCULAR | Status: AC
Start: 2024-01-24 — End: 2024-01-24
  Filled 2024-01-24: qty 2

## 2024-01-24 MED ORDER — FENTANYL CITRATE (PF) 50 MCG/ML IJ SOSY
25.0000 ug | PREFILLED_SYRINGE | INTRAMUSCULAR | Status: DC | PRN
  Administered 2024-01-24: 50 ug via INTRAVENOUS

## 2024-01-24 MED ORDER — LIDOCAINE HCL (PF) 2 % IJ SOLN
INTRAMUSCULAR | Status: AC
  Filled 2024-01-24: qty 5

## 2024-01-24 MED ORDER — SUGAMMADEX SODIUM 200 MG/2ML IV SOLN
INTRAVENOUS | Status: AC
  Filled 2024-01-24: qty 2

## 2024-01-24 MED ORDER — DEXAMETHASONE SOD PHOSPHATE PF 10 MG/ML IJ SOLN
4.0000 mg | INTRAMUSCULAR | Status: AC
  Administered 2024-01-24: 4 mg via INTRAVENOUS

## 2024-01-24 MED ORDER — SUGAMMADEX SODIUM 200 MG/2ML IV SOLN
INTRAVENOUS | Status: DC | PRN
  Administered 2024-01-24: 200 mg via INTRAVENOUS

## 2024-01-24 MED ORDER — FENTANYL CITRATE (PF) 50 MCG/ML IJ SOSY
PREFILLED_SYRINGE | INTRAMUSCULAR | Status: AC
  Filled 2024-01-24: qty 1

## 2024-01-24 MED ORDER — ROCURONIUM BROMIDE 100 MG/10ML IV SOLN
INTRAVENOUS | Status: DC | PRN
  Administered 2024-01-24: 50 mg via INTRAVENOUS

## 2024-01-24 MED ORDER — METRONIDAZOLE 500 MG/100ML IV SOLN
INTRAVENOUS | Status: AC
  Filled 2024-01-24: qty 100

## 2024-01-24 MED ORDER — FENTANYL CITRATE (PF) 100 MCG/2ML IJ SOLN
INTRAMUSCULAR | Status: AC
  Filled 2024-01-24: qty 2

## 2024-01-24 MED ORDER — CEFAZOLIN SODIUM-DEXTROSE 2-4 GM/100ML-% IV SOLN
2.0000 g | Freq: Once | INTRAVENOUS | Status: AC
  Administered 2024-01-24: 2 g via INTRAVENOUS

## 2024-01-24 MED ORDER — BUPIVACAINE HCL 0.25 % IJ SOLN
INTRAMUSCULAR | Status: DC | PRN
  Administered 2024-01-24: 30 mL

## 2024-01-24 MED ORDER — LABETALOL HCL 5 MG/ML IV SOLN
INTRAVENOUS | Status: DC | PRN
  Administered 2024-01-24 (×2): 5 mg via INTRAVENOUS

## 2024-01-24 MED ORDER — PROPOFOL 10 MG/ML IV BOLUS
INTRAVENOUS | Status: DC | PRN
  Administered 2024-01-24: 110 mg via INTRAVENOUS

## 2024-01-24 MED ORDER — LACTATED RINGERS IV SOLN
INTRAVENOUS | Status: DC
Start: 2024-01-24 — End: 2024-01-24

## 2024-01-24 MED ORDER — OXYCODONE HCL 5 MG PO TABS
ORAL_TABLET | ORAL | Status: AC
  Filled 2024-01-24: qty 1

## 2024-01-24 MED ORDER — ORAL CARE MOUTH RINSE: 15.0000 mL | Freq: Once | OROMUCOSAL | Status: AC

## 2024-01-24 MED ORDER — ONDANSETRON HCL 4 MG/2ML IJ SOLN
INTRAMUSCULAR | Status: DC | PRN
  Administered 2024-01-24: 4 mg via INTRAVENOUS

## 2024-01-24 MED ORDER — METRONIDAZOLE 500 MG/100ML IV SOLN
500.0000 mg | Freq: Once | INTRAVENOUS | Status: AC
  Administered 2024-01-24: 500 mg via INTRAVENOUS
  Filled 2024-01-24: qty 100

## 2024-01-24 MED ORDER — CEFAZOLIN SODIUM-DEXTROSE 2-4 GM/100ML-% IV SOLN
INTRAVENOUS | Status: AC
  Filled 2024-01-24: qty 100

## 2024-01-24 SURGICAL SUPPLY — 75 items
APPLICATOR SURGIFLO ENDO (HEMOSTASIS) IMPLANT
BAG LAPAROSCOPIC 12 15 PORT 16 (BASKET) IMPLANT
BLADE SURG SZ10 CARB STEEL (BLADE) IMPLANT
CHLORAPREP W/TINT 26 (MISCELLANEOUS) ×2 IMPLANT
CLIP LIGATING HEMO LOK XL GOLD (MISCELLANEOUS) ×2 IMPLANT
COVER BACK TABLE 60X90IN (DRAPES) ×3 IMPLANT
COVER TIP SHEARS 8 DVNC (MISCELLANEOUS) ×1 IMPLANT
DERMABOND ADVANCED .7 DNX12 (GAUZE/BANDAGES/DRESSINGS) ×3 IMPLANT
DRAPE ARM DVNC X/XI (DISPOSABLE) ×4 IMPLANT
DRAPE COLUMN DVNC XI (DISPOSABLE) ×1 IMPLANT
DRAPE SHEET LG 3/4 BI-LAMINATE (DRAPES) ×3 IMPLANT
DRAPE SURG IRRIG POUCH 19X23 (DRAPES) ×3 IMPLANT
DRIVER NDL MEGA SUTCUT DVNCXI (INSTRUMENTS) ×1 IMPLANT
DRIVER NDLE MEGA SUTCUT DVNCXI (INSTRUMENTS) ×1 IMPLANT
DRSG OPSITE POSTOP 4X6 (GAUZE/BANDAGES/DRESSINGS) IMPLANT
DRSG OPSITE POSTOP 4X8 (GAUZE/BANDAGES/DRESSINGS) IMPLANT
ELECT PENCIL ROCKER SW 15FT (MISCELLANEOUS) IMPLANT
ELECT REM PT RETURN 15FT ADLT (MISCELLANEOUS) ×3 IMPLANT
FORCEPS BPLR FENES DVNC XI (FORCEP) ×1 IMPLANT
FORCEPS PROGRASP DVNC XI (FORCEP) ×1 IMPLANT
GAUZE 4X4 16PLY ~~LOC~~+RFID DBL (SPONGE) ×3 IMPLANT
GLOVE BIO SURGEON STRL SZ 6.5 (GLOVE) ×3 IMPLANT
GLOVE BIOGEL PI IND STRL 6.5 (GLOVE) ×6 IMPLANT
GLOVE BIOGEL PI MICRO STRL 6 (GLOVE) ×12 IMPLANT
GOWN STRL REUS W/ TWL LRG LVL3 (GOWN DISPOSABLE) ×10 IMPLANT
GRASPER SUT TROCAR 14GX15 (MISCELLANEOUS) ×2 IMPLANT
HOLDER FOLEY CATH W/STRAP (MISCELLANEOUS) IMPLANT
IRRIGATION SUCT STRKRFLW 2 WTP (MISCELLANEOUS) ×1 IMPLANT
KIT PROCEDURE DVNC SI (MISCELLANEOUS) IMPLANT
KIT TURNOVER KIT A (KITS) ×3 IMPLANT
LHOOK LAP DISP 36CM (ELECTROSURGICAL) ×2 IMPLANT
LIGASURE IMPACT 36 18CM CVD LR (INSTRUMENTS) IMPLANT
MANIPULATOR ADVINCU DEL 3.0 PL (MISCELLANEOUS) IMPLANT
MANIPULATOR ADVINCU DEL 3.5 PL (MISCELLANEOUS) IMPLANT
MANIPULATOR UTERINE 4.5 ZUMI (MISCELLANEOUS) IMPLANT
NDL HYPO 21X1.5 SAFETY (NEEDLE) ×1 IMPLANT
NDL INSUFFLATION 14GA 120MM (NEEDLE) IMPLANT
NDL SPNL 20GX3.5 QUINCKE YW (NEEDLE) IMPLANT
NEEDLE HYPO 21X1.5 SAFETY (NEEDLE) ×3 IMPLANT
NEEDLE INSUFFLATION 14GA 120MM (NEEDLE) IMPLANT
NEEDLE SPNL 20GX3.5 QUINCKE YW (NEEDLE) IMPLANT
OBTURATOR OPTICALSTD 8 DVNC (TROCAR) ×1 IMPLANT
PACK ROBOT GYN CUSTOM WL (TRAY / TRAY PROCEDURE) ×1 IMPLANT
PAD ARMBOARD POSITIONER FOAM (MISCELLANEOUS) ×3 IMPLANT
PAD POSITIONING PINK XL (MISCELLANEOUS) ×3 IMPLANT
PORT ACCESS TROCAR AIRSEAL 12 (TROCAR) ×2 IMPLANT
POUCH RETRIEVAL ECOSAC 10 (ENDOMECHANICALS) ×2 IMPLANT
SCISSORS LAP 5X45 EPIX DISP (ENDOMECHANICALS) ×2 IMPLANT
SCISSORS MNPLR CVD DVNC XI (INSTRUMENTS) ×1 IMPLANT
SCRUB CHG 4% DYNA-HEX 4OZ (MISCELLANEOUS) ×6 IMPLANT
SEAL UNIV 5-12 XI (MISCELLANEOUS) ×4 IMPLANT
SET TRI-LUMEN FLTR TB AIRSEAL (TUBING) ×1 IMPLANT
SHEET LAVH (DRAPES) ×2 IMPLANT
SLEEVE Z-THREAD 5X100MM (TROCAR) ×2 IMPLANT
SPIKE FLUID TRANSFER (MISCELLANEOUS) ×3 IMPLANT
SPONGE T-LAP 18X18 ~~LOC~~+RFID (SPONGE) ×2 IMPLANT
SURGIFLO W/THROMBIN 8M KIT (HEMOSTASIS) IMPLANT
SUT MNCRL AB 4-0 PS2 18 (SUTURE) ×4 IMPLANT
SUT PDS AB 1 TP1 54 (SUTURE) IMPLANT
SUT VIC AB 0 CT1 27XBRD ANTBC (SUTURE) IMPLANT
SUT VIC AB 2-0 CT1 TAPERPNT 27 (SUTURE) ×2 IMPLANT
SUT VIC AB 4-0 PS2 18 (SUTURE) ×6 IMPLANT
SUT VICRYL 0 27 CT2 27 ABS (SUTURE) ×3 IMPLANT
SYR 10ML LL (SYRINGE) IMPLANT
SYSTEM BAG RETRIEVAL 10MM (BASKET) IMPLANT
SYSTEM WOUND ALEXIS 18CM MED (MISCELLANEOUS) IMPLANT
TRAP SPECIMEN MUCUS 40CC (MISCELLANEOUS) IMPLANT
TRAY FOLEY MTR SLVR 16FR STAT (SET/KITS/TRAYS/PACK) ×1 IMPLANT
TRAY LAPAROSCOPIC (CUSTOM PROCEDURE TRAY) ×2 IMPLANT
TROCAR PORT AIRSEAL 5X120 (TROCAR) IMPLANT
TROCAR Z-THREAD OPTICAL 5X100M (TROCAR) ×2 IMPLANT
TUBING INSUFFLATION 10FT LAP (TUBING) ×2 IMPLANT
UNDERPAD 30X36 HEAVY ABSORB (UNDERPADS AND DIAPERS) ×2 IMPLANT
WATER STERILE IRR 1000ML POUR (IV SOLUTION) ×3 IMPLANT
YANKAUER SUCT BULB TIP 10FT TU (MISCELLANEOUS) IMPLANT

## 2024-01-24 NOTE — Anesthesia Preprocedure Evaluation (Signed)
 Anesthesia Evaluation  Patient identified by MRN, date of birth, ID band Patient awake    Reviewed: Allergy & Precautions, NPO status , Patient's Chart, lab work & pertinent test results  History of Anesthesia Complications Negative for: history of anesthetic complications  Airway Mallampati: II  TM Distance: >3 FB Neck ROM: Full    Dental  (+) Chipped,    Pulmonary neg pulmonary ROS, neg sleep apnea, neg COPD, Patient abstained from smoking.Not current smoker, former smoker   Pulmonary exam normal breath sounds clear to auscultation       Cardiovascular Exercise Tolerance: Good METShypertension, Pt. on medications (-) CAD and (-) Past MI (-) dysrhythmias  Rhythm:Regular Rate:Normal - Systolic murmurs    Neuro/Psych  PSYCHIATRIC DISORDERS Anxiety     negative neurological ROS     GI/Hepatic PUD,GERD  Controlled,,(+)     (-) substance abuse    Endo/Other  neg diabetes    Renal/GU negative Renal ROS     Musculoskeletal  (+) Arthritis ,    Abdominal   Peds  Hematology   Anesthesia Other Findings Past Medical History: No date: Anxiety No date: Arthritis 11/17/2020: Cellulitis of chin No date: Colon polyps No date: Complication of anesthesia 11/17/2020: COVID-19 No date: Gastric ulcer     Comment:  per patient; in the 1970s No date: GERD (gastroesophageal reflux disease) No date: H. pylori infection No date: Heart murmur     Comment:  ECHO 12-13-2019 No date: Hyperlipidemia     Comment:  no treatment per pt No date: Hypertension No date: Liver cyst 1984: Ovarian cyst No date: Pneumonia No date: SUI (stress urinary incontinence, female)  Reproductive/Obstetrics                              Anesthesia Physical Anesthesia Plan  ASA: 2  Anesthesia Plan: General   Post-op Pain Management: Tylenol PO (pre-op)* and Toradol IV (intra-op)*   Induction: Intravenous  PONV Risk  Score and Plan: 4 or greater and Ondansetron  and Dexamethasone   Airway Management Planned: Oral ETT  Additional Equipment: None  Intra-op Plan:   Post-operative Plan: Extubation in OR  Informed Consent: I have reviewed the patients History and Physical, chart, labs and discussed the procedure including the risks, benefits and alternatives for the proposed anesthesia with the patient or authorized representative who has indicated his/her understanding and acceptance.     Dental advisory given  Plan Discussed with: CRNA and Surgeon  Anesthesia Plan Comments: (Discussed risks of anesthesia with patient, including PONV, sore throat, lip/dental/eye damage, post operative cognitive dysfunction. Rare risks discussed as well, such as cardiorespiratory and neurological sequelae, and allergic reactions. Discussed the role of CRNA in patient's perioperative care. Patient understands.)        Anesthesia Quick Evaluation

## 2024-01-24 NOTE — Anesthesia Procedure Notes (Signed)
 Procedure Name: Intubation Date/Time: 01/24/2024 11:48 AM  Performed by: Deeann Eva BROCKS, CRNAPre-anesthesia Checklist: Patient identified, Emergency Drugs available, Suction available and Patient being monitored Patient Re-evaluated:Patient Re-evaluated prior to induction Oxygen Delivery Method: Circle System Utilized Preoxygenation: Pre-oxygenation with 100% oxygen Induction Type: IV induction Ventilation: Mask ventilation without difficulty Laryngoscope Size: Mac and 3 Grade View: Grade II Tube type: Oral Number of attempts: 1 Airway Equipment and Method: Stylet and Oral airway Placement Confirmation: ETT inserted through vocal cords under direct vision, positive ETCO2 and breath sounds checked- equal and bilateral Secured at: 21 cm Tube secured with: Tape Dental Injury: Teeth and Oropharynx as per pre-operative assessment

## 2024-01-24 NOTE — Op Note (Signed)
 Preoperative diagnosis: right lower quadrant mass  Postoperative diagnosis: Same   Procedure: laparoscopic appendectomy  Surgeon: Herlene Bureau, M.D.  Co-Surgeon: Hoy Masters, M.D., The work up was concerning for gynecologic vs intestinal malignancy therefore, Dr. Masters was required for her unique skill set and decision making.  Anesthesia: Gen.   Indications for procedure: Kristen Schultz is a 78 y.o. female with concern for mass in the right adnexa vs appendix. After discussion of risks and benefits she was taken to the operating room for diagnostic laparoscopy, possible appendectomy, possible salingo-oophorectomy  Description of procedure: The patient was brought into the operative suite, placed supine. Anesthesia was administered with endotracheal tube. The patient's arms were tucked. All pressure points were offloaded by foam padding. The patient was prepped and draped in the usual sterile fashion.  A transverse incision was made to the left subcostal incision. Pneumoperitoneum was applied with high flow low pressure.  2 5mm trocars were placed, one in the suprapubic space, one in the LLQ, the left subcostal incision was then up-sized and a 12mm trocar placed in that space. A transversus abdominal block was placed on the left and right sides. Next, the patient was placed in trendelenberg, rotated to the left. The omentum was retracted cephalad. The cecum and appendix were identified. The appendix tip and distal body was dilated concerning for mass. The base of the appendix was dissected and a window through the mesoappendix was created with blunt dissection. Large Hem-o-lock clips were used to doubly ligate the base of the appendix and mesoappendix. The appendix was cut free with scissors.  The appendix was placed in a specimen bag. The pelvis and RLQ were irrigated. No abnormalities seen in the pelvis. The appendix was removed via the 12 mm trocar. 0 vicryl was used to close the  fascial defect. Pneumoperitoneum was removed, all trocars were removed. All incisions were closed with 4-0 monocryl subcuticular stitch. The patient woke from anesthesia and was brought to PACU in stable condition.  Findings: appendiceal mass  Specimen: appendix  Blood loss: 20 ml  Local anesthesia: 30 ml Marcaine  Complications: none  Images:   Herlene Bureau, M.D. General, Bariatric, & Minimally Invasive Surgery [facility]

## 2024-01-24 NOTE — Discharge Instructions (Addendum)
 AFTER SURGERY INSTRUCTIONS   Return to work: 4-6 weeks if applicable   Activity: 1. Be up and out of the bed during the day.  Take a nap if needed.  You may walk up steps but be careful and use the hand rail.  Stair climbing will tire you more than you think, you may need to stop part way and rest.    2. No lifting or straining for 6 weeks over 10 pounds. No pushing, pulling, straining for 6 weeks.   3. No driving for 4-89 days when the following criteria have been met: Do not drive if you are taking narcotic pain medicine and make sure that your reaction time has returned.    4. You can shower as soon as the next day after surgery. Shower daily.  Use your regular soap and water (not directly on the incision) and pat your incision(s) dry afterwards; don't rub.  No tub baths or submerging your body in water until cleared by your surgeon. If you have the soap that was given to you by pre-surgical testing that was used before surgery, you do not need to use it afterwards because this can irritate your incisions.    5. No sexual activity and nothing in the vagina for 10-12 weeks.   6. You may experience a small amount of clear drainage from your incisions, which is normal.  If the drainage persists, increases, or changes color please call the office.   7. Do not use creams, lotions, or ointments such as neosporin on your incisions after surgery until advised by your surgeon because they can cause removal of the dermabond glue on your incisions.     8. You may experience vaginal spotting after surgery or when the stitches at the top of the vagina begin to dissolve.  The spotting is normal but if you experience heavy bleeding, call our office.   9. Take Tylenol or ibuprofen first for pain if you are able to take these medications and only use narcotic pain medication for severe pain not relieved by the Tylenol or Ibuprofen.  Monitor your Tylenol intake to a max of 4,000 mg in a 24 hour period. You can  alternate these medications after surgery.   Diet: 1. Low sodium Heart Healthy Diet is recommended but you are cleared to resume your normal (before surgery) diet after your procedure.   2. It is safe to use a laxative, such as Miralax or Colace, if you have difficulty moving your bowels before surgery. You have been prescribed Sennakot-S to take at bedtime every evening after surgery to keep bowel movements regular and to prevent constipation.     Wound Care: 1. Keep clean and dry.  Shower daily.   Reasons to call the Doctor: Fever - Oral temperature greater than 100.4 degrees Fahrenheit Foul-smelling vaginal discharge Difficulty urinating Nausea and vomiting Increased pain at the site of the incision that is unrelieved with pain medicine. Difficulty breathing with or without chest pain New calf pain especially if only on one side Sudden, continuing increased vaginal bleeding with or without clots.   Contacts: For questions or concerns you should contact:   Dr. Hoy Masters at [phone]   Eleanor Epps, NP at [phone]   After Hours: call [phone] and have the GYN Oncologist paged/contacted (after 5 pm or on the weekends). You will speak with an after hours RN and let he or she know you have had surgery.   Messages sent via mychart are for non-urgent  matters and are not responded to after hours so for urgent needs, please call the after hours number.

## 2024-01-24 NOTE — Interval H&P Note (Signed)
 History and Physical Interval Note:  01/24/2024 10:47 AM  Kristen Schultz  has presented today for surgery, with the diagnosis of ADNEXAL MASS.  The various methods of treatment have been discussed with the patient and family. After consideration of risks, benefits and other options for treatment, the patient has consented to  Procedure(s) with comments: SALPINGO-OOPHORECTOMY, BILATERAL, ROBOT-ASSISTED (Bilateral) - POSSIBLE STAGING, POSSIBLE MINI LAPAROTOMY HYSTERECTOMY, TOTAL, ROBOT-ASSISTED (N/A) as a surgical intervention.    We did discuss that based on the updated imaging there is the possibility that this instead is coming from the appendix in which case we may perform an appendectomy. If so, and the ovaries are normal, we will not remove the ovaries. Addition of possible appendectomy added to consent form. Patient in agreement.   The patient's history has been reviewed, patient examined, no change in status, stable for surgery.  I have reviewed the patient's chart and labs.  Questions were answered to the patient's satisfaction.     Demaryius Imran

## 2024-01-24 NOTE — Anesthesia Postprocedure Evaluation (Signed)
 Anesthesia Post Note  Patient: Kristen Schultz  Procedure(s) Performed: APPENDECTOMY, LAPAROSCOPIC (Abdomen)     Patient location during evaluation: PACU Anesthesia Type: General Level of consciousness: awake and alert Pain management: pain level controlled Vital Signs Assessment: post-procedure vital signs reviewed and stable Respiratory status: spontaneous breathing, nonlabored ventilation, respiratory function stable and patient connected to nasal cannula oxygen Cardiovascular status: blood pressure returned to baseline and stable (Hypertensive, but close to preop baseline. Denies chest pain, SOB, headache, vision changes) Postop Assessment: no apparent nausea or vomiting Anesthetic complications: no   No notable events documented.  Last Vitals:  Vitals:   01/24/24 1407 01/24/24 1415  BP: (!) 179/86 (!) 172/79  Pulse: (!) 55 (!) 55  Resp: (!) 21 20  Temp:  36.9 C  SpO2: 93% 94%    Last Pain:  Vitals:   01/24/24 1415  TempSrc:   PainSc: 4                  Rome Ade

## 2024-01-24 NOTE — Transfer of Care (Signed)
 Immediate Anesthesia Transfer of Care Note  Patient: Kristen Schultz  Procedure(s) Performed: APPENDECTOMY, LAPAROSCOPIC (Abdomen)  Patient Location: PACU  Anesthesia Type:General  Level of Consciousness: awake and alert   Airway & Oxygen Therapy: Patient Spontanous Breathing and Patient connected to face mask oxygen  Post-op Assessment: Report given to RN and Post -op Vital signs reviewed and stable  Post vital signs: Reviewed and stable  Last Vitals:  Vitals Value Taken Time  BP 162/127 01/24/24 13:30  Temp    Pulse 57 01/24/24 13:34  Resp 12 01/24/24 13:33  SpO2 97 % 01/24/24 13:34  Vitals shown include unfiled device data.  Last Pain:  Vitals:   01/24/24 0924  TempSrc:   PainSc: 0-No pain         Complications: No notable events documented.

## 2024-01-25 ENCOUNTER — Telehealth: Payer: Self-pay | Admitting: *Deleted

## 2024-01-25 ENCOUNTER — Encounter (HOSPITAL_COMMUNITY): Payer: Self-pay | Admitting: General Surgery

## 2024-01-25 NOTE — Telephone Encounter (Signed)
 Spoke with Kristen Schultz this morning. She states she is eating, drinking and urinating well. She has not had a BM yet but is passing gas. She is taking senokot as prescribed and encouraged her to drink plenty of water. She denies fever or chills. Incisions are dry and intact. She rates her pain 5/10. Her pain is controlled with tramadol .    Instructed to call office with any fever, chills, purulent drainage, uncontrolled pain or any other questions or concerns. Patient verbalizes understanding.   Pt aware of post op appointments as well as the office number [phone] and after hours number [phone] to call if she has any questions or concerns  Pt was given the number to [facility] -Dr. Roselynn office for follow up information. At [phone].

## 2024-01-26 LAB — SURGICAL PATHOLOGY

## 2024-01-30 ENCOUNTER — Telehealth: Payer: Self-pay | Admitting: Psychiatry

## 2024-01-30 NOTE — Telephone Encounter (Signed)
 Called pt with results from surgery. Has follow-up with Dr. Stevie on 02/09/24. Will cancel her postop with our office. All questions answered.

## 2024-02-05 ENCOUNTER — Other Ambulatory Visit: Payer: Self-pay | Admitting: Family Medicine

## 2024-02-05 DIAGNOSIS — I10 Essential (primary) hypertension: Secondary | ICD-10-CM

## 2024-02-13 ENCOUNTER — Inpatient Hospital Stay: Admitting: Psychiatry

## 2024-02-14 ENCOUNTER — Telehealth: Payer: Self-pay

## 2024-02-14 NOTE — Telephone Encounter (Signed)
 Per Dr. Antonio-  Yes--- I definitely agree with this.  That type of cancer can be very aggressive and you want to make sure it has not spread to colon--- on the other hand she can ask for a second opinion if she is  not sure-- please call her    Spoke w/ Pt- informed of PCP recommendations. Pt verbalized understanding.

## 2024-02-14 NOTE — Telephone Encounter (Signed)
 Copied from CRM [id number]. Topic: Clinical - Medical Advice >> Feb 14, 2024  3:34 PM Taleah C wrote: Reason for CRM: pt called and explained that she recently had surgery with the assistance of a Doctor from [facility], Dr. Simonne. She said that the doctor is now suggesting that they take out part f her colon and do a res-section to check it for cancer based on their medical protocol. She asked for a nurse to call her to confirm if Dr. Antonio thinks this procedure is necessary because she thinks it may be invasive. Please call and advise pt with med advice.

## 2024-02-19 ENCOUNTER — Ambulatory Visit: Payer: Self-pay | Admitting: General Surgery

## 2024-02-19 DIAGNOSIS — Z08 Encounter for follow-up examination after completed treatment for malignant neoplasm: Secondary | ICD-10-CM

## 2024-02-20 ENCOUNTER — Inpatient Hospital Stay: Admitting: Psychiatry

## 2024-03-06 NOTE — Patient Instructions (Addendum)
SURGICAL WAITING ROOM VISITATION Patients having surgery or a procedure may have no more than 2 support people in the waiting area - these visitors may rotate in the visitor waiting room.   If the patient needs to stay at the hospital during part of their recovery, the visitor guidelines for inpatient rooms apply.   PRE-OP VISITATION  Pre-op nurse will coordinate an appropriate time for 1 support person to accompany the patient in pre-op.  This support person may not rotate.  This visitor will be contacted when the time is appropriate for the visitor to come back in the pre-op area.   Please refer to the [facility] website for the visitor guidelines for Inpatients (after your surgery is over and you are in a regular room).   You are not required to quarantine at this time prior to your surgery. However, you must do this: Hand Hygiene often Do NOT share personal items Notify your provider if you are in close contact with someone who has COVID or you develop fever 100.4 or greater, new onset of sneezing, cough, sore throat, shortness of breath or body aches.  If you test positive for Covid or have been in contact with anyone that has tested positive in the last 10 days please notify you surgeon.     Your procedure is scheduled on:  Tuesday  12-30- 2025   Report to [facility] Main Entrance: [address] entrance where the [employer] is available.    Report to admitting at:07:45   AM   Call this number if you have any questions or problems the morning of surgery [phone]   FOLLOW ANY ADDITIONAL PRE OP INSTRUCTIONS YOU RECEIVED FROM YOUR SURGEON'S OFFICE!!!  Dulcolax 20 mg (total) - Take 4 (four) of the 5 mg Dulcolax tablets with water  at 07:00 am the day prior to surgery.  Miralax 238 g - Mix with 64 oz Gatorade/Powerade.  Starting at 10:00 am ,Drink this gradually over the next few hours (8 oz glass every 15-30 minutes) until gone the day prior to surgery You should finish in 4  hours-6 hours.    Neomycin 1000 mg - At 2 pm, 3 pm and 10 pm after Miralax  bowel prep the day prior to surgery.  Metronidazole  1000 mg - At 2 pm, 3 pm and 10 pm after Miralax bowel prep the day prior to surgery.   Ondansetron  (Zofran ) can be used if you have any nausea from the other bowel prep medications.  Drink plenty of clear liquids all evening to avoid getting dehydrated.    DRINK two (2) bottles of Pre-Surgery Clear Ensure or G2 drink starting at 6:00 pm the evening prior to your surgery to help prevent dehydration. Increase drinking clear fluids (see list below)           Do not eat food after Midnight the night prior to your surgery/procedure.   After Midnight you may have the following liquids until   07:00 AM  DAY OF SURGERY   Clear Liquid Diet Water  Black Coffee (sugar ok, NO MILK/CREAM OR CREAMERS)  Tea (sugar ok, NO MILK/CREAM OR CREAMERS) regular and decaf                             Plain Jell-O  with no fruit (NO RED)  Fruit ices (not with fruit pulp, NO RED)                                     Popsicles (NO RED)                                                                  Juice: NO CITRUS JUICES: only apple, WHITE grape, WHITE cranberry Sports drinks like Gatorade or Powerade (NO RED)     The day of surgery:  Drink ONE (1) Pre-Surgery Clear Ensure at  07:00   AM the morning of surgery. Drink in one sitting. Do not sip.  This drink was given to you during your hospital pre-op appointment visit. Nothing else to drink after completing the Pre-Surgery Clear Ensure : No candy, chewing gum or throat lozenges.                             Oral Hygiene is also important to reduce your risk of infection.        Remember - BRUSH YOUR TEETH THE MORNING OF SURGERY WITH YOUR REGULAR TOOTHPASTE   Do NOT smoke after Midnight the night before surgery.   STOP TAKING all Vitamins, Herbs and supplements 1 week before your surgery.     Take ONLY these medicines the morning of surgery with A SIP OF WATER : Citalopram      DO NOT TAKE LOSARTAN  the morning of your surgery.    You may not have any metal on your body including hair pins, jewelry, and body piercing   Do not wear make-up, lotions, powders, perfumes  or deodorant   Do not wear nail polish including gel and S&S, artificial / acrylic nails, or any other type of covering on natural nails including finger and toenails. If you have artificial nails, gel coating, etc., that needs to be removed by a nail salon, Please have this removed prior to surgery. Not doing so may mean that your surgery could be cancelled or delayed if the Surgeon or anesthesia staff feels like they are unable to monitor you safely.    Do not shave 48 hours prior to surgery to avoid nicks in your skin which may contribute to postoperative infections.      Contacts, Hearing Aids, dentures or bridgework may not be worn into surgery. DENTURES WILL BE REMOVED PRIOR TO SURGERY PLEASE DO NOT APPLY Poly grip OR ADHESIVES!!!   Patients discharged on the day of surgery will not be allowed to drive home.  Someone NEEDS to stay with you for the first 24 hours after anesthesia.   Do not bring your home medications to the hospital. The Pharmacy will dispense medications listed on your medication list to you during your admission in the Hospital.   Please read over the following fact sheets you were given: IF YOU HAVE QUESTIONS ABOUT YOUR PRE-OP INSTRUCTIONS, PLEASE CALL [phone]         [facility] - Preparing for Surgery        Before surgery, you can play an important role.  Because skin is not sterile, your skin needs to be as free of germs  as possible.  You can reduce the number of germs on your skin by washing with CHG (chlorahexidine gluconate) soap before surgery.  CHG is an antiseptic cleaner which kills germs and bonds with the skin to continue killing germs even after washing. Please DO NOT use  if you have an allergy to CHG or antibacterial soaps.  If your skin becomes reddened/irritated stop using the CHG and inform your nurse when you arrive at Short Stay. Do not shave (including legs and underarms) for at least 48 hours prior to the first CHG shower.  You may shave your face/neck.   Please follow these instructions carefully:             1.  Shower with CHG Soap the night before surgery ONLY (DO NOT USE THE CHG SOAP THE MORNING OF SURGERY).             2.  If you choose to wash your hair, wash your hair first as usual with your normal  shampoo.             3.  After you shampoo, rinse your hair and body thoroughly to remove the shampoo.                                           4.  Use CHG as you would any other liquid soap.  You can apply chg directly to the skin and wash.  Gently with a scrungie or clean washcloth.             5.  Apply the CHG Soap to your body ONLY FROM THE NECK DOWN.   Do not use on face/ open                           Wound or open sores. Avoid contact with eyes, ears mouth and genitals (private parts).                       Wash face,  Genitals (private parts) with your normal soap.             6.  Wash thoroughly, paying special attention to the area where your surgery  will be performed.             7.  Thoroughly rinse your body with warm water  from the neck down.             8.  DO NOT shower/wash with your normal soap after using and rinsing off the CHG Soap.                9.  Pat yourself dry with a clean towel.            10.  Wear clean pajamas.            11.  Place clean sheets on your bed the night of your first shower and do not  sleep with pets.   Day of Surgery : Do not apply any CHG, lotions/deodorants the morning of surgery.  Please wear clean clothes to the hospital/surgery center.    FAILURE TO FOLLOW THESE INSTRUCTIONS MAY RESULT IN THE CANCELLATION OF YOUR SURGERY   PATIENT SIGNATURE_________________________________   NURSE  SIGNATURE__________________________________   ________________________________________________________________________        WHAT IS A BLOOD TRANSFUSION? Blood  Transfusion Information  A transfusion is the replacement of blood or some of its parts. Blood is made up of multiple cells which provide different functions. Red blood cells carry oxygen and are used for blood loss replacement. White blood cells fight against infection. Platelets control bleeding. Plasma helps clot blood. Other blood products are available for specialized needs, such as hemophilia or other clotting disorders. BEFORE THE TRANSFUSION  Who gives blood for transfusions?  Healthy volunteers who are fully evaluated to make sure their blood is safe. This is blood bank blood. Transfusion therapy is the safest it has ever been in the practice of medicine. Before blood is taken from a donor, a complete history is taken to make sure that person has no history of diseases nor engages in risky social behavior (examples are intravenous drug use or sexual activity with multiple partners). The donor's travel history is screened to minimize risk of transmitting infections, such as malaria. The donated blood is tested for signs of infectious diseases, such as HIV and hepatitis. The blood is then tested to be sure it is compatible with you in order to minimize the chance of a transfusion reaction. If you or a relative donates blood, this is often done in anticipation of surgery and is not appropriate for emergency situations. It takes many days to process the donated blood. RISKS AND COMPLICATIONS Although transfusion therapy is very safe and saves many lives, the main dangers of transfusion include:  Getting an infectious disease. Developing a transfusion reaction. This is an allergic reaction to something in the blood you were given. Every precaution is taken to prevent this. The decision to have a blood transfusion has been  considered carefully by your caregiver before blood is given. Blood is not given unless the benefits outweigh the risks. AFTER THE TRANSFUSION Right after receiving a blood transfusion, you will usually feel much better and more energetic. This is especially true if your red blood cells have gotten low (anemic). The transfusion raises the level of the red blood cells which carry oxygen, and this usually causes an energy increase. The nurse administering the transfusion will monitor you carefully for complications. HOME CARE INSTRUCTIONS  No special instructions are needed after a transfusion. You may find your energy is better. Speak with your caregiver about any limitations on activity for underlying diseases you may have. SEEK MEDICAL CARE IF:  Your condition is not improving after your transfusion. You develop redness or irritation at the intravenous (IV) site. SEEK IMMEDIATE MEDICAL CARE IF:  Any of the following symptoms occur over the next 12 hours: Shaking chills. You have a temperature by mouth above 102 F (38.9 C), not controlled by medicine. Chest, back, or muscle pain. People around you feel you are not acting correctly or are confused. Shortness of breath or difficulty breathing. Dizziness and fainting. You get a rash or develop hives. You have a decrease in urine output. Your urine turns a dark color or changes to pink, red, or brown. Any of the following symptoms occur over the next 10 days: You have a temperature by mouth above 102 F (38.9 C), not controlled by medicine. Shortness of breath. Weakness after normal activity. The white part of the eye turns yellow (jaundice). You have a decrease in the amount of urine or are urinating less often. Your urine turns a dark color or changes to pink, red, or brown. Document Released: 03/05/2000 Document Revised: 05/31/2011 Document Reviewed: 10/23/2007 [facility] Patient Information 2014 [place],  [place].  _______________________________________________________________________ °

## 2024-03-06 NOTE — Progress Notes (Signed)
 COVID Vaccine received:  []  No [x]  Yes Date of any COVID positive Test in last 90 days:  None   PCP -  Jamee Schultze, MD  Cardiologist - Candyce Reek, MD (LOV 02-04-2020)   Chest x-ray - CT Chest  12-29-2023  Epic EKG - 01-19-24 at PCP office. Scanned to Epic  Stress Test -  ECHO - 12-13-2019  Cardiac Cath -  CT Coronary Calcium  score:    Bowel Prep - []  No  [x]   Yes __   Pacemaker / ICD device [x]  No []  Yes   Spinal Cord Stimulator:[x]  No []  Yes       History of Sleep Apnea? [x]  No []  Yes   CPAP used?- [x]  No []  Yes     Patient has: [x]  NO Hx DM   []  Pre-DM   []  DM1  []   DM2 Does the patient monitor blood sugar?   [x]  N/A   []  No []  Yes  Last A1c was: 5.4  on  04-10-2020,  repeat ordered for PST      Blood Thinner / Instructions: none Aspirin Instructions:  none   Activity level: Able to walk up 2 flights of stairs without becoming significantly short of breath or having chest pain?  []  No   [x]    Yes  Patient can perform ADLs without assistance. []  No   [x]   Yes  TAKE DOS: Citalopram  HOLD DOS: Losartan     Anesthesia review: HTN, GAD, Bradycardia, Palps- PACs & PVCs, GERD, PONV   Patient denies any S&S of respiratory illness or Covid - no shortness of breath, fever, cough or chest pain at PAT appointment.   Patient verbalized understanding and agreement to the Pre-Surgical Instructions that were given to them at this PAT appointment. Patient was also educated of the need to review these PAT instructions again prior to her surgery.I reviewed the appropriate phone numbers to call if they have any and questions or concerns.

## 2024-03-07 ENCOUNTER — Other Ambulatory Visit: Payer: Self-pay

## 2024-03-07 ENCOUNTER — Encounter (HOSPITAL_COMMUNITY): Admission: RE | Admit: 2024-03-07 | Discharge: 2024-03-07 | Attending: General Surgery

## 2024-03-07 ENCOUNTER — Encounter (HOSPITAL_COMMUNITY): Payer: Self-pay

## 2024-03-07 ENCOUNTER — Ambulatory Visit: Payer: Self-pay | Admitting: General Surgery

## 2024-03-07 VITALS — BP 169/88 | HR 66 | Temp 98.4°F | Resp 16 | Ht 64.0 in | Wt 152.1 lb

## 2024-03-07 DIAGNOSIS — Z85038 Personal history of other malignant neoplasm of large intestine: Secondary | ICD-10-CM | POA: Diagnosis not present

## 2024-03-07 DIAGNOSIS — Z79899 Other long term (current) drug therapy: Secondary | ICD-10-CM | POA: Insufficient documentation

## 2024-03-07 DIAGNOSIS — Z01812 Encounter for preprocedural laboratory examination: Secondary | ICD-10-CM | POA: Diagnosis not present

## 2024-03-07 DIAGNOSIS — Z01818 Encounter for other preprocedural examination: Secondary | ICD-10-CM

## 2024-03-07 DIAGNOSIS — I10 Essential (primary) hypertension: Secondary | ICD-10-CM | POA: Insufficient documentation

## 2024-03-07 DIAGNOSIS — Z08 Encounter for follow-up examination after completed treatment for malignant neoplasm: Secondary | ICD-10-CM | POA: Diagnosis not present

## 2024-03-07 LAB — BASIC METABOLIC PANEL WITH GFR
Anion gap: 9 (ref 5–15)
BUN: 17 mg/dL (ref 8–23)
CO2: 26 mmol/L (ref 22–32)
Calcium: 9.5 mg/dL (ref 8.9–10.3)
Chloride: 103 mmol/L (ref 98–111)
Creatinine, Ser: 0.73 mg/dL (ref 0.44–1.00)
GFR, Estimated: >60 mL/min (ref >60)
Glucose, Bld: 91 mg/dL (ref 70–99)
Potassium: 5.2 mmol/L — ABNORMAL HIGH (ref 3.5–5.1)
Sodium: 139 mmol/L (ref 135–145)

## 2024-03-07 LAB — CBC
HCT: 42.4 % (ref 36.0–46.0)
Hemoglobin: 13.9 g/dL (ref 12.0–15.0)
MCH: 31.5 pg (ref 26.0–34.0)
MCHC: 32.8 g/dL (ref 30.0–36.0)
MCV: 96.1 fL (ref 80.0–100.0)
Platelets: 246 K/uL (ref 150–400)
RBC: 4.41 MIL/uL (ref 3.87–5.11)
RDW: 13.7 % (ref 11.5–15.5)
WBC: 8.4 K/uL (ref 4.0–10.5)
nRBC: 0 % (ref 0.0–0.2)

## 2024-03-07 LAB — TYPE AND SCREEN
ABO/RH(D): O POS
Antibody Screen: NEGATIVE

## 2024-03-08 LAB — HEMOGLOBIN A1C
Hgb A1c MFr Bld: 5.2 % (ref 4.8–5.6)
Mean Plasma Glucose: 102.54 mg/dL

## 2024-03-15 ENCOUNTER — Other Ambulatory Visit: Payer: Self-pay | Admitting: Family Medicine

## 2024-03-15 DIAGNOSIS — F411 Generalized anxiety disorder: Secondary | ICD-10-CM

## 2024-03-19 NOTE — Anesthesia Preprocedure Evaluation (Addendum)
 "                                  Anesthesia Evaluation  Patient identified by MRN, date of birth, ID band Patient awake    Reviewed: Allergy & Precautions, NPO status , Patient's Chart, lab work & pertinent test results  History of Anesthesia Complications Negative for: history of anesthetic complications  Airway Mallampati: II  TM Distance: >3 FB Neck ROM: Full    Dental no notable dental hx. (+) Chipped,    Pulmonary neg sleep apnea, neg COPD, Patient abstained from smoking.Not current smoker, former smoker   Pulmonary exam normal breath sounds clear to auscultation       Cardiovascular Exercise Tolerance: Good METShypertension, Pt. on medications (-) CAD and (-) Past MI Normal cardiovascular exam(-) dysrhythmias  Rhythm:Regular Rate:Normal     Neuro/Psych  PSYCHIATRIC DISORDERS Anxiety     negative neurological ROS     GI/Hepatic PUD,GERD  Controlled,,(+)     (-) substance abuse    Endo/Other  neg diabetes    Renal/GU negative Renal ROSLab Results      Component                Value               Date                           K                        5.2 (H)             03/07/2024                   CREATININE               0.73                03/07/2024                     Musculoskeletal  (+) Arthritis ,    Abdominal   Peds  Hematology Lab Results      Component                Value               Date                      WBC                      8.4                 03/07/2024                HGB                      13.9                03/07/2024                HCT                      42.4                03/07/2024  MCV                      96.1                03/07/2024                PLT                      246                 03/07/2024              Anesthesia Other Findings All: codeine, PCN, Tetracycline  Reproductive/Obstetrics                              Anesthesia Physical Anesthesia  Plan  ASA: 3  Anesthesia Plan: General   Post-op Pain Management: Tylenol  PO (pre-op)*, Toradol IV (intra-op)*, Precedex  and Lidocaine  infusion*   Induction: Intravenous and Cricoid pressure planned  PONV Risk Score and Plan: 4 or greater and Ondansetron , Dexamethasone  and Treatment may vary due to age or medical condition  Airway Management Planned: Oral ETT  Additional Equipment: None  Intra-op Plan:   Post-operative Plan: Extubation in OR  Informed Consent: I have reviewed the patients History and Physical, chart, labs and discussed the procedure including the risks, benefits and alternatives for the proposed anesthesia with the patient or authorized representative who has indicated his/her understanding and acceptance.     Dental advisory given  Plan Discussed with: CRNA and Surgeon  Anesthesia Plan Comments:          Anesthesia Quick Evaluation  "

## 2024-03-20 ENCOUNTER — Inpatient Hospital Stay (HOSPITAL_COMMUNITY): Payer: Self-pay | Admitting: Anesthesiology

## 2024-03-20 ENCOUNTER — Inpatient Hospital Stay (HOSPITAL_COMMUNITY)
Admission: RE | Admit: 2024-03-20 | Discharge: 2024-03-22 | DRG: 331 | Disposition: A | Discharge status: Home / Self Care | Attending: General Surgery | Admitting: General Surgery

## 2024-03-20 ENCOUNTER — Other Ambulatory Visit: Payer: Self-pay

## 2024-03-20 ENCOUNTER — Inpatient Hospital Stay (HOSPITAL_COMMUNITY): Payer: Self-pay | Admitting: Medical

## 2024-03-20 ENCOUNTER — Encounter (HOSPITAL_COMMUNITY): Payer: Self-pay | Admitting: General Surgery

## 2024-03-20 ENCOUNTER — Encounter (HOSPITAL_COMMUNITY)
Admission: RE | Disposition: A | Discharge status: Home / Self Care | Payer: Self-pay | Source: Home / Self Care | Attending: General Surgery

## 2024-03-20 DIAGNOSIS — Z87891 Personal history of nicotine dependence: Secondary | ICD-10-CM | POA: Diagnosis not present

## 2024-03-20 DIAGNOSIS — K5900 Constipation, unspecified: Secondary | ICD-10-CM | POA: Diagnosis present

## 2024-03-20 DIAGNOSIS — I10 Essential (primary) hypertension: Secondary | ICD-10-CM

## 2024-03-20 DIAGNOSIS — C181 Malignant neoplasm of appendix: Secondary | ICD-10-CM

## 2024-03-20 DIAGNOSIS — F419 Anxiety disorder, unspecified: Secondary | ICD-10-CM | POA: Diagnosis not present

## 2024-03-20 DIAGNOSIS — Z79899 Other long term (current) drug therapy: Secondary | ICD-10-CM

## 2024-03-20 HISTORY — PX: LAPAROSCOPIC RIGHT COLECTOMY: SHX5925

## 2024-03-20 LAB — CBC
HCT: 38.3 % (ref 36.0–46.0)
Hemoglobin: 12.8 g/dL (ref 12.0–15.0)
MCH: 31.7 pg (ref 26.0–34.0)
MCHC: 33.4 g/dL (ref 30.0–36.0)
MCV: 94.8 fL (ref 80.0–100.0)
Platelets: 237 K/uL (ref 150–400)
RBC: 4.04 MIL/uL (ref 3.87–5.11)
RDW: 13.8 % (ref 11.5–15.5)
WBC: 12 K/uL — ABNORMAL HIGH (ref 4.0–10.5)
nRBC: 0 % (ref 0.0–0.2)

## 2024-03-20 LAB — CREATININE, SERUM
Creatinine, Ser: 0.75 mg/dL (ref 0.44–1.00)
GFR, Estimated: >60 mL/min

## 2024-03-20 SURGERY — COLECTOMY, RIGHT, LAPAROSCOPIC
Anesthesia: General | Site: Abdomen | Laterality: Right

## 2024-03-20 MED ORDER — ENSURE PRE-SURGERY PO LIQD
592.0000 mL | Freq: Once | ORAL | Status: DC
  Filled 2024-03-20: qty 592

## 2024-03-20 MED ORDER — BUPIVACAINE-EPINEPHRINE (PF) 0.25% -1:200000 IJ SOLN
INTRAMUSCULAR | Status: AC
  Filled 2024-03-20: qty 60

## 2024-03-20 MED ORDER — OXYCODONE HCL 5 MG PO TABS: 5.0000 mg | ORAL_TABLET | Freq: Once | ORAL | Status: DC | PRN

## 2024-03-20 MED ORDER — DIPHENHYDRAMINE HCL 12.5 MG/5ML PO ELIX: 12.5000 mg | ORAL_SOLUTION | Freq: Four times a day (QID) | ORAL | Status: DC | PRN

## 2024-03-20 MED ORDER — DEXMEDETOMIDINE HCL IN NACL 80 MCG/20ML IV SOLN
INTRAVENOUS | Status: DC | PRN
  Administered 2024-03-20: 8 ug via INTRAVENOUS

## 2024-03-20 MED ORDER — TRAMADOL HCL 50 MG PO TABS
50.0000 mg | ORAL_TABLET | Freq: Four times a day (QID) | ORAL | Status: DC | PRN
  Administered 2024-03-21 – 2024-03-22 (×3): 50 mg via ORAL
  Filled 2024-03-20 (×5): qty 1

## 2024-03-20 MED ORDER — ONDANSETRON HCL 4 MG/2ML IJ SOLN: 4.0000 mg | Freq: Four times a day (QID) | INTRAMUSCULAR | Status: DC | PRN

## 2024-03-20 MED ORDER — LACTATED RINGERS IV SOLN: INTRAVENOUS | Status: DC

## 2024-03-20 MED ORDER — ENSURE PRE-SURGERY PO LIQD: 296.0000 mL | Freq: Once | ORAL | Status: DC

## 2024-03-20 MED ORDER — CITALOPRAM HYDROBROMIDE 10 MG PO TABS
15.0000 mg | ORAL_TABLET | Freq: Every day | ORAL | Status: DC
  Administered 2024-03-21 – 2024-03-22 (×2): 15 mg via ORAL
  Filled 2024-03-20 (×2): qty 2

## 2024-03-20 MED ORDER — FENTANYL CITRATE (PF) 50 MCG/ML IJ SOSY: 25.0000 ug | PREFILLED_SYRINGE | INTRAMUSCULAR | Status: DC | PRN

## 2024-03-20 MED ORDER — ORAL CARE MOUTH RINSE: 15.0000 mL | Freq: Once | OROMUCOSAL | Status: AC

## 2024-03-20 MED ORDER — ROCURONIUM BROMIDE 10 MG/ML (PF) SYRINGE
PREFILLED_SYRINGE | INTRAVENOUS | Status: AC
  Filled 2024-03-20: qty 20

## 2024-03-20 MED ORDER — MORPHINE SULFATE (PF) 2 MG/ML IV SOLN: 2.0000 mg | INTRAVENOUS | Status: DC | PRN

## 2024-03-20 MED ORDER — METOPROLOL TARTRATE 5 MG/5ML IV SOLN: 5.0000 mg | Freq: Four times a day (QID) | INTRAVENOUS | Status: DC | PRN

## 2024-03-20 MED ORDER — KETAMINE HCL 50 MG/5ML IJ SOSY
PREFILLED_SYRINGE | INTRAMUSCULAR | Status: AC
  Filled 2024-03-20: qty 5

## 2024-03-20 MED ORDER — ONDANSETRON HCL 4 MG/2ML IJ SOLN: 4.0000 mg | Freq: Once | INTRAMUSCULAR | Status: DC | PRN

## 2024-03-20 MED ORDER — SUGAMMADEX SODIUM 200 MG/2ML IV SOLN
INTRAVENOUS | Status: AC
  Filled 2024-03-20: qty 6

## 2024-03-20 MED ORDER — CHLORHEXIDINE GLUCONATE 0.12 % MT SOLN
15.0000 mL | Freq: Once | OROMUCOSAL | Status: AC
  Administered 2024-03-20: 15 mL via OROMUCOSAL

## 2024-03-20 MED ORDER — POLYETHYLENE GLYCOL 3350 17 GM/SCOOP PO POWD: 238.0000 g | Freq: Once | ORAL | Status: DC

## 2024-03-20 MED ORDER — SODIUM CHLORIDE 0.45 % IV SOLN: INTRAVENOUS | Status: DC

## 2024-03-20 MED ORDER — ACETAMINOPHEN 325 MG PO TABS: 650.0000 mg | ORAL_TABLET | Freq: Four times a day (QID) | ORAL | Status: DC | PRN

## 2024-03-20 MED ORDER — BISACODYL 5 MG PO TBEC: 20.0000 mg | DELAYED_RELEASE_TABLET | Freq: Once | ORAL | Status: DC

## 2024-03-20 MED ORDER — ACETAMINOPHEN 10 MG/ML IV SOLN: 1000.0000 mg | Freq: Once | INTRAVENOUS | Status: DC | PRN

## 2024-03-20 MED ORDER — METRONIDAZOLE 500 MG PO TABS: 1000.0000 mg | ORAL_TABLET | ORAL | Status: DC

## 2024-03-20 MED ORDER — SUCCINYLCHOLINE CHLORIDE 200 MG/10ML IV SOSY
PREFILLED_SYRINGE | INTRAVENOUS | Status: AC
  Filled 2024-03-20: qty 10

## 2024-03-20 MED ORDER — BUPIVACAINE LIPOSOME 1.3 % IJ SUSP: 20.0000 mL | Freq: Once | INTRAMUSCULAR | Status: DC

## 2024-03-20 MED ORDER — SUCCINYLCHOLINE CHLORIDE 200 MG/10ML IV SOSY
PREFILLED_SYRINGE | INTRAVENOUS | Status: DC | PRN
  Administered 2024-03-20: 80 mg via INTRAVENOUS

## 2024-03-20 MED ORDER — CHLORHEXIDINE GLUCONATE 4 % EX SOLN: 1.0000 | Freq: Once | CUTANEOUS | Status: DC

## 2024-03-20 MED ORDER — 0.9 % SODIUM CHLORIDE (POUR BTL) OPTIME
TOPICAL | Status: DC | PRN
  Administered 2024-03-20: 2000 mL

## 2024-03-20 MED ORDER — LACTATED RINGERS IV SOLN: INTRAVENOUS | Status: DC | PRN

## 2024-03-20 MED ORDER — LOSARTAN POTASSIUM 50 MG PO TABS
50.0000 mg | ORAL_TABLET | Freq: Every day | ORAL | Status: DC
  Administered 2024-03-21 – 2024-03-22 (×2): 50 mg via ORAL
  Filled 2024-03-20 (×2): qty 1

## 2024-03-20 MED ORDER — OXYCODONE HCL 5 MG PO TABS: 5.0000 mg | ORAL_TABLET | Freq: Four times a day (QID) | ORAL | Status: DC | PRN

## 2024-03-20 MED ORDER — LIDOCAINE HCL (PF) 2 % IJ SOLN
INTRAMUSCULAR | Status: AC
  Filled 2024-03-20: qty 20

## 2024-03-20 MED ORDER — ONDANSETRON HCL 4 MG/2ML IJ SOLN
INTRAMUSCULAR | Status: DC | PRN
  Administered 2024-03-20: 4 mg via INTRAVENOUS

## 2024-03-20 MED ORDER — ENSURE SURGERY PO LIQD
237.0000 mL | Freq: Two times a day (BID) | ORAL | Status: DC
  Administered 2024-03-20 – 2024-03-22 (×3): 237 mL via ORAL

## 2024-03-20 MED ORDER — ACETAMINOPHEN 500 MG PO TABS
1000.0000 mg | ORAL_TABLET | ORAL | Status: AC
  Administered 2024-03-20: 1000 mg via ORAL
  Filled 2024-03-20: qty 2

## 2024-03-20 MED ORDER — PROPOFOL 500 MG/50ML IV EMUL
INTRAVENOUS | Status: DC | PRN
  Administered 2024-03-20: 150 ug/kg/min via INTRAVENOUS

## 2024-03-20 MED ORDER — SUGAMMADEX SODIUM 200 MG/2ML IV SOLN
INTRAVENOUS | Status: DC | PRN
  Administered 2024-03-20: 200 mg via INTRAVENOUS

## 2024-03-20 MED ORDER — FENTANYL CITRATE (PF) 100 MCG/2ML IJ SOLN
INTRAMUSCULAR | Status: AC
  Filled 2024-03-20: qty 2

## 2024-03-20 MED ORDER — ALVIMOPAN 12 MG PO CAPS
12.0000 mg | ORAL_CAPSULE | Freq: Two times a day (BID) | ORAL | Status: DC
  Administered 2024-03-21: 12 mg via ORAL
  Filled 2024-03-20: qty 1

## 2024-03-20 MED ORDER — PHENYLEPHRINE HCL (PRESSORS) 10 MG/ML IV SOLN
INTRAVENOUS | Status: AC
  Filled 2024-03-20: qty 1

## 2024-03-20 MED ORDER — HEPARIN SODIUM (PORCINE) 5000 UNIT/ML IJ SOLN
5000.0000 [IU] | Freq: Once | INTRAMUSCULAR | Status: AC
  Administered 2024-03-20: 5000 [IU] via SUBCUTANEOUS
  Filled 2024-03-20: qty 1

## 2024-03-20 MED ORDER — ENOXAPARIN SODIUM 40 MG/0.4ML IJ SOSY
40.0000 mg | PREFILLED_SYRINGE | INTRAMUSCULAR | Status: DC
  Administered 2024-03-21 – 2024-03-22 (×2): 40 mg via SUBCUTANEOUS
  Filled 2024-03-20 (×2): qty 0.4

## 2024-03-20 MED ORDER — DIPHENHYDRAMINE HCL 50 MG/ML IJ SOLN: 12.5000 mg | Freq: Four times a day (QID) | INTRAMUSCULAR | Status: DC | PRN

## 2024-03-20 MED ORDER — MIDAZOLAM HCL 2 MG/2ML IJ SOLN
INTRAMUSCULAR | Status: AC
  Filled 2024-03-20: qty 2

## 2024-03-20 MED ORDER — ALVIMOPAN 12 MG PO CAPS: 12.0000 mg | ORAL_CAPSULE | ORAL | Status: DC

## 2024-03-20 MED ORDER — GABAPENTIN 300 MG PO CAPS
300.0000 mg | ORAL_CAPSULE | ORAL | Status: AC
  Administered 2024-03-20: 300 mg via ORAL
  Filled 2024-03-20: qty 1

## 2024-03-20 MED ORDER — CELECOXIB 200 MG PO CAPS
200.0000 mg | ORAL_CAPSULE | Freq: Two times a day (BID) | ORAL | Status: DC
  Administered 2024-03-20 – 2024-03-21 (×3): 200 mg via ORAL
  Filled 2024-03-20 (×3): qty 1

## 2024-03-20 MED ORDER — HYDROMORPHONE HCL 1 MG/ML IJ SOLN
INTRAMUSCULAR | Status: DC | PRN
  Administered 2024-03-20 (×2): .5 mg via INTRAVENOUS
  Administered 2024-03-20: 1 mg via INTRAVENOUS

## 2024-03-20 MED ORDER — PANTOPRAZOLE SODIUM 40 MG PO TBEC
40.0000 mg | DELAYED_RELEASE_TABLET | Freq: Every day | ORAL | Status: DC
  Administered 2024-03-20 – 2024-03-22 (×3): 40 mg via ORAL
  Filled 2024-03-20 (×3): qty 1

## 2024-03-20 MED ORDER — HYDROMORPHONE HCL 2 MG/ML IJ SOLN
INTRAMUSCULAR | Status: AC
  Filled 2024-03-20: qty 1

## 2024-03-20 MED ORDER — DEXAMETHASONE SOD PHOSPHATE PF 10 MG/ML IJ SOLN
INTRAMUSCULAR | Status: DC | PRN
  Administered 2024-03-20: 10 mg via INTRAVENOUS

## 2024-03-20 MED ORDER — MIDAZOLAM HCL (PF) 2 MG/2ML IJ SOLN
INTRAMUSCULAR | Status: DC | PRN
  Administered 2024-03-20: 1 mg via INTRAVENOUS

## 2024-03-20 MED ORDER — NEOMYCIN SULFATE 500 MG PO TABS: 1000.0000 mg | ORAL_TABLET | ORAL | Status: DC

## 2024-03-20 MED ORDER — ALVIMOPAN 12 MG PO CAPS
12.0000 mg | ORAL_CAPSULE | ORAL | Status: AC
  Administered 2024-03-20: 12 mg via ORAL
  Filled 2024-03-20: qty 1

## 2024-03-20 MED ORDER — PROPOFOL 10 MG/ML IV BOLUS
INTRAVENOUS | Status: DC | PRN
  Administered 2024-03-20: 110 mg via INTRAVENOUS

## 2024-03-20 MED ORDER — ONDANSETRON HCL 4 MG PO TABS: 4.0000 mg | ORAL_TABLET | Freq: Four times a day (QID) | ORAL | Status: DC | PRN

## 2024-03-20 MED ORDER — OXYCODONE HCL 5 MG/5ML PO SOLN: 5.0000 mg | Freq: Once | ORAL | Status: DC | PRN

## 2024-03-20 MED ORDER — LIDOCAINE 2% (20 MG/ML) 5 ML SYRINGE
INTRAMUSCULAR | Status: DC | PRN
  Administered 2024-03-20: 80 mg via INTRAVENOUS

## 2024-03-20 MED ORDER — FENTANYL CITRATE (PF) 250 MCG/5ML IJ SOLN
INTRAMUSCULAR | Status: DC | PRN
  Administered 2024-03-20 (×2): 25 ug via INTRAVENOUS
  Administered 2024-03-20: 50 ug via INTRAVENOUS

## 2024-03-20 MED ORDER — SIMETHICONE 80 MG PO CHEW: 40.0000 mg | CHEWABLE_TABLET | Freq: Four times a day (QID) | ORAL | Status: DC | PRN

## 2024-03-20 MED ORDER — BUPIVACAINE-EPINEPHRINE 0.25% -1:200000 IJ SOLN
INTRAMUSCULAR | Status: DC | PRN
  Administered 2024-03-20: 40 mL

## 2024-03-20 MED ORDER — SODIUM CHLORIDE 0.9 % IV SOLN
2.0000 g | INTRAVENOUS | Status: AC
  Administered 2024-03-20: 2 g via INTRAVENOUS
  Filled 2024-03-20: qty 2

## 2024-03-20 MED ORDER — HEPARIN SODIUM (PORCINE) 5000 UNIT/ML IJ SOLN: 5000.0000 [IU] | Freq: Once | INTRAMUSCULAR | Status: DC

## 2024-03-20 MED ORDER — ROCURONIUM BROMIDE 10 MG/ML (PF) SYRINGE
PREFILLED_SYRINGE | INTRAVENOUS | Status: DC | PRN
  Administered 2024-03-20: 80 mg via INTRAVENOUS

## 2024-03-20 SURGICAL SUPPLY — 51 items
BAG COUNTER SPONGE SURGICOUNT (BAG) IMPLANT
BENZOIN TINCTURE PRP APPL 2/3 (GAUZE/BANDAGES/DRESSINGS) IMPLANT
BLADE EXTENDED COATED 6.5IN (ELECTRODE) IMPLANT
BNDG ADH 1X3 SHEER STRL LF (GAUZE/BANDAGES/DRESSINGS) IMPLANT
CHLORAPREP W/TINT 26 (MISCELLANEOUS) ×1 IMPLANT
CLIP APPLIE 5 13 M/L LIGAMAX5 (MISCELLANEOUS) IMPLANT
CLIP APPLIE ROT 10 11.4 M/L (STAPLE) IMPLANT
COVER SURGICAL LIGHT HANDLE (MISCELLANEOUS) ×1 IMPLANT
DERMABOND ADVANCED .7 DNX12 (GAUZE/BANDAGES/DRESSINGS) IMPLANT
DRAIN CHANNEL 19F RND (DRAIN) IMPLANT
DRSG OPSITE POSTOP 4X10 (GAUZE/BANDAGES/DRESSINGS) IMPLANT
DRSG OPSITE POSTOP 4X6 (GAUZE/BANDAGES/DRESSINGS) IMPLANT
DRSG OPSITE POSTOP 4X8 (GAUZE/BANDAGES/DRESSINGS) IMPLANT
ELECT REM PT RETURN 15FT ADLT (MISCELLANEOUS) ×1 IMPLANT
GAUZE SPONGE 4X4 12PLY STRL (GAUZE/BANDAGES/DRESSINGS) IMPLANT
GLOVE BIOGEL PI IND STRL 7.0 (GLOVE) ×2 IMPLANT
GLOVE SURG SS PI 7.0 STRL IVOR (GLOVE) ×2 IMPLANT
GOWN STRL REUS W/ TWL LRG LVL3 (GOWN DISPOSABLE) ×2 IMPLANT
IRRIGATION SUCT STRKRFLW 2 WTP (MISCELLANEOUS) ×1 IMPLANT
KIT TURNOVER KIT A (KITS) ×1 IMPLANT
PACK COLON (CUSTOM PROCEDURE TRAY) ×1 IMPLANT
PAD POSITIONING PINK XL (MISCELLANEOUS) IMPLANT
PENCIL SMOKE EVACUATOR (MISCELLANEOUS) ×1 IMPLANT
PROTECTOR NERVE ULNAR (MISCELLANEOUS) IMPLANT
RELOAD STAPLE 60 2.6 WHT THN (STAPLE) IMPLANT
RELOAD STAPLE 75 3.8 BLU REG (ENDOMECHANICALS) IMPLANT
RETRACTOR WND ALEXIS 18 MED (MISCELLANEOUS) IMPLANT
SCISSORS LAP 5X35 DISP (ENDOMECHANICALS) ×1 IMPLANT
SET TUBE SMOKE EVAC HIGH FLOW (TUBING) ×1 IMPLANT
SHEARS HARMONIC 36 ACE (MISCELLANEOUS) ×1 IMPLANT
SLEEVE Z-THREAD 5X100MM (TROCAR) ×2 IMPLANT
SPIKE FLUID TRANSFER (MISCELLANEOUS) ×1 IMPLANT
STAPLER ECHELON LONG 60 440 (INSTRUMENTS) IMPLANT
STAPLER GUN LINEAR PROX 60 (STAPLE) IMPLANT
STAPLER PROXIMATE 75MM BLUE (STAPLE) IMPLANT
STAPLER SKIN PROX 35W (STAPLE) IMPLANT
STRIP CLOSURE SKIN 1/2X4 (GAUZE/BANDAGES/DRESSINGS) IMPLANT
SUT MNCRL AB 4-0 PS2 18 (SUTURE) IMPLANT
SUT PDS AB 0 CT1 36 (SUTURE) IMPLANT
SUT PROLENE 2 0 KS (SUTURE) IMPLANT
SUT SILK 2 0 SH CR/8 (SUTURE) ×1 IMPLANT
SUT SILK 2-0 18XBRD TIE 12 (SUTURE) ×1 IMPLANT
SUT SILK 3 0 SH CR/8 (SUTURE) ×1 IMPLANT
SUT SILK 3-0 18XBRD TIE 12 (SUTURE) ×1 IMPLANT
SUT VIC AB 2-0 SH 27X BRD (SUTURE) ×1 IMPLANT
SUT VICRYL 0 ENDOLOOP (SUTURE) IMPLANT
SYSTEM LAPSCP GELPORT 120MM (MISCELLANEOUS) IMPLANT
TAPE CLOTH 4X10 WHT NS (GAUZE/BANDAGES/DRESSINGS) IMPLANT
TRAY FOLEY MTR SLVR 16FR STAT (SET/KITS/TRAYS/PACK) IMPLANT
TROCAR ADV FIXATION 12X100MM (TROCAR) ×1 IMPLANT
TROCAR Z-THREAD OPTICAL 5X100M (TROCAR) ×1 IMPLANT

## 2024-03-20 NOTE — Anesthesia Procedure Notes (Signed)
 Procedure Name: Intubation Date/Time: 03/20/2024 9:55 AM  Performed by: Mollie Olivia SAUNDERS, CRNAPre-anesthesia Checklist: Patient identified, Emergency Drugs available, Suction available and Patient being monitored Patient Re-evaluated:Patient Re-evaluated prior to induction Oxygen Delivery Method: Circle system utilized Preoxygenation: Pre-oxygenation with 100% oxygen Induction Type: IV induction, Rapid sequence and Cricoid Pressure applied Laryngoscope Size: Glidescope and 3 Grade View: Grade I Tube type: Oral Tube size: 7.0 mm Number of attempts: 1 Airway Equipment and Method: Rigid stylet and Video-laryngoscopy Placement Confirmation: ETT inserted through vocal cords under direct vision, positive ETCO2 and breath sounds checked- equal and bilateral Secured at: 21 cm Tube secured with: Tape Dental Injury: Teeth and Oropharynx as per pre-operative assessment  Difficulty Due To: Difficulty was anticipated and Difficult Airway- due to anterior larynx Future Recommendations: Recommend- induction with short-acting agent, and alternative techniques readily available Comments: Pt hx of grade II and grade III aw c DVL, bougie used x 1 before. Grade I view c glidescope.

## 2024-03-20 NOTE — H&P (Signed)
 Chief Complaint  Patient presents with  Post Operative Visit   Subjective   Kristen Schultz is a 78 y.o. female established patient in today for: History of Present Illness Kristen Schultz is a 78 year old female who presents for follow-up after appendiceal surgery revealing invasive cancer. She was referred by a gynecologist for surgical evaluation of a suspected ovarian mass.  She underwent surgery for a mass initially suspected to be ovarian but identified as invasive appendiceal cancer. She experienced abdominal discomfort for eight months, with a sensation of distension. Multiple examinations over two years initially showed no significant findings. An MRI was incomplete, but a CT scan revealed a right lower quadrant mass, leading to surgical intervention.  Her last colonoscopy was within the past two years. Since the surgery, she has experienced constipation. Socially, she was active and enjoyed hiking until six months ago, and she aims to return to her previous activity level.  There is no problem list on file for this patient.  Outpatient Medications Prior to Visit  Medication Sig Dispense Refill  citalopram  (CELEXA ) 10 MG tablet Take 10 mg by mouth once daily  losartan  (COZAAR ) 50 MG tablet Take 50 mg by mouth once daily   No facility-administered medications prior to visit.    Objective   Vitals:  02/13/24 1413  BP: 130/80  Pulse: 84  Temp: 36.4 C (97.6 F)  SpO2: 97%  Weight: 68.7 kg (151 lb 6.4 oz)  Height: 165.1 cm (5' 5)  PainSc: 4   Body mass index is 25.19 kg/m. Physical Exam Constitutional:  Appearance: Normal appearance.  HENT:  Head: Normocephalic and atraumatic.  Pulmonary:  Effort: Pulmonary effort is normal.  Abdominal:  Comments: Incisions well healed  Musculoskeletal:  General: Normal range of motion.  Cervical back: Normal range of motion.  Neurological:  General: No focal deficit present.  Mental Status: She is alert and oriented to person,  place, and time. Mental status is at baseline.  Psychiatric:  Mood and Affect: Mood normal.  Behavior: Behavior normal.  Thought Content: Thought content normal.    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 Proximal margin negative, no lymphovascular invasion. No metastasis on imaging. Further surgical intervention recommended for complete resection and staging. - Schedule right hemicolectomy within 1-2 months for resection and staging. - Explained surgical risks: 1% complication risk requiring colostomy, rare anastomotic leak risk (1 in 300). - Discussed potential postoperative bowel habit changes, including increased frequency. - Explained 2-6 day hospital recovery period post-surgery. - Emphasized timely surgery to minimize residual cancer risk.  Planned right hemicolectomy for cancer staging and management Right hemicolectomy planned for appendiceal adenocarcinoma management and staging. Procedure involves resection of right colon and lymph nodes, ensuring no residual cancer and proper staging. - Schedule right hemicolectomy within 1-2 months. - Perform preoperative bowel preparation to reduce infection risk. - Monitor for infection or bleeding postoperatively. - Manage postoperative pain and nausea. - Monitor bowel function and adjust diet post-surgery.  Postoperative abdominal pain and constipation following appendectomy Postoperative abdominal pain and constipation noted. - Apply Neosporin to irritated incision  sites. - Monitor  bowel movements and manage constipation. Diagnoses and all orders for this visit:  Cancer of appendix (CMS/HHS-HCC)

## 2024-03-20 NOTE — Op Note (Signed)
 Preoperative diagnosis: adenocarcinoma of the appendix  Postoperative diagnosis: same   Procedure: laparoscopic right colectomy with ileocolic anastomosis  Surgeon: Herlene Bureau, M.D.  Asst: Krystal Spinner, M.D.  Anesthesia: GETA  Indications for procedure: Kristen Schultz is a 78 y.o. year old female with appendix pathology positive for adenocarcinoma presents for colon resection.  Description of procedure: The patient was brought into the operative suite. Anesthesia was administered with General endotracheal anesthesia. WHO checklist was applied. The patient was then placed in supine position. The area was prepped and draped in the usual sterile fashion.  Next, a left subcostal incision was made. A 5mm trocar was used to gain access to the peritoneal cavity by optical entry technique. Pneumoperitoneum was applied with a high flow and low pressure. The laparoscope was reinserted to confirm position.  One 5 mm trocar was placed in the left lateral space, one 5 mm trocar was placed in the suprapubic area, and one 12 mm trocar was placed in the supraumbilical area.  Marcaine  was placed into the T AP space on the left.  The omentum was adhered to the liver and right upper quadrant.  This was taken down with harmonic scalpel.  The cecum and terminal ileum was identified.  The white line of Toldt was taken down with harmonic scalpel.  This was continued up and through the hepatic flexure.  This allows lateral visualization of the duodenum.  The mesentery of the right colon was bluntly freed off of the duodenum.  There was 1 loop of small intestine adhered to the omentum this was taken down sharply with scissors.  A window was made in the mesentery.  Ileocolic vessels were identified.  5 mm clip was placed over the proximal ileocolic artery.  Vessels were taken with harmonic scalpel.  Mesentery was then continued to the terminal ileum and the transverse colon with harmonic scalpel.  Once the right  colon was seen as fully mobile, decision was made to expiratory lysed.  Supraumbilical trocar incision was enlarged.  Cautery was used dissect down through subcutaneous tissues and the fascia was divided in the midline.  Wound protector was put in place.  Right colon and terminal ileum were brought out through the incision.  Terminal ileum was divided with 75 mm GIA blue load stapler.  Portion of the transverse colon was chosen for division using 75 mm blue load GIA stapler.  Enterotomy and colotomy were made and a 75 mm blue load GIA stapler was used for anastomosis.  Enterotomy was closed with a 60 mm blue load TA stapler.  Antitension stitch was placed with 3-0 silk.  Additional 3 oh silks were used to imbricate the corners of the staple line.  Hemostasis was intact.  Intestine was placed back into the abdomen.  Colorectal changeover was performed.  Fascia was closed with 0 PDS in running fashion.  Additional Marcaine  was used into the fascia along the midline.  All incisions were closed with 4-0 Monocryl in subcuticular fashion.  Dermabond was put in place for dressing.  Patient awoke from anesthesia and was brought to PACU in stable condition all counts are correct.  Findings: Right colon with normal anatomy, no bulky lymphadenopathy, no liver masses  Specimen: Right colon  Implant: None  Blood loss: 50 ml  Local anesthesia: 40 ml marcaine    Complications: none  Herlene Bureau, M.D. General, Bariatric, & Minimally Invasive Surgery [facility]

## 2024-03-20 NOTE — Anesthesia Postprocedure Evaluation (Signed)
"   Anesthesia Post Note  Patient: Kristen Schultz  Procedure(s) Performed: COLECTOMY, RIGHT, LAPAROSCOPIC (Right: Abdomen)     Patient location during evaluation: PACU Anesthesia Type: General Level of consciousness: awake and alert Pain management: pain level controlled Vital Signs Assessment: post-procedure vital signs reviewed and stable Respiratory status: spontaneous breathing, nonlabored ventilation, respiratory function stable and patient connected to nasal cannula oxygen Cardiovascular status: blood pressure returned to baseline and stable Postop Assessment: no apparent nausea or vomiting Anesthetic complications: no   No notable events documented.  Last Vitals:  Vitals:   03/20/24 1330 03/20/24 1524  BP: 120/79 (!) 153/88  Pulse: 74 87  Resp: 12 16  Temp:  36.5 C  SpO2: 98% 99%    Last Pain:  Vitals:   03/20/24 1524  TempSrc: Oral  PainSc:                  Kristen Schultz      "

## 2024-03-20 NOTE — Transfer of Care (Signed)
 Immediate Anesthesia Transfer of Care Note  Patient: Kristen Schultz  Procedure(s) Performed: COLECTOMY, RIGHT, LAPAROSCOPIC (Right: Abdomen)  Patient Location: PACU  Anesthesia Type:General  Level of Consciousness: awake, alert , and oriented  Airway & Oxygen Therapy: Patient Spontanous Breathing and Patient connected to face mask oxygen  Post-op Assessment: Report given to RN, Post -op Vital signs reviewed and stable, and Patient moving all extremities X 4  Post vital signs: Reviewed and stable  Last Vitals:  Vitals Value Taken Time  BP 130/97 03/20/24 12:06  Temp    Pulse 65 03/20/24 12:15  Resp 11 03/20/24 12:15  SpO2 100 % 03/20/24 12:15  Vitals shown include unfiled device data.  Last Pain:  Vitals:   03/20/24 0823  TempSrc:   PainSc: 0-No pain      Patients Stated Pain Goal: 5 (03/20/24 0815)  Complications: No notable events documented.

## 2024-03-21 ENCOUNTER — Encounter (HOSPITAL_COMMUNITY): Payer: Self-pay | Admitting: General Surgery

## 2024-03-21 ENCOUNTER — Other Ambulatory Visit (HOSPITAL_COMMUNITY): Payer: Self-pay

## 2024-03-21 LAB — CBC
HCT: 37.7 % (ref 36.0–46.0)
Hemoglobin: 12.9 g/dL (ref 12.0–15.0)
MCH: 32.2 pg (ref 26.0–34.0)
MCHC: 34.2 g/dL (ref 30.0–36.0)
MCV: 94 fL (ref 80.0–100.0)
Platelets: 257 K/uL (ref 150–400)
RBC: 4.01 MIL/uL (ref 3.87–5.11)
RDW: 13.8 % (ref 11.5–15.5)
WBC: 11.2 K/uL — ABNORMAL HIGH (ref 4.0–10.5)
nRBC: 0 % (ref 0.0–0.2)

## 2024-03-21 LAB — BASIC METABOLIC PANEL WITH GFR
Anion gap: 9 (ref 5–15)
BUN: 9 mg/dL (ref 8–23)
CO2: 26 mmol/L (ref 22–32)
Calcium: 9.2 mg/dL (ref 8.9–10.3)
Chloride: 99 mmol/L (ref 98–111)
Creatinine, Ser: 0.69 mg/dL (ref 0.44–1.00)
GFR, Estimated: >60 mL/min
Glucose, Bld: 113 mg/dL — ABNORMAL HIGH (ref 70–99)
Potassium: 3.9 mmol/L (ref 3.5–5.1)
Sodium: 134 mmol/L — ABNORMAL LOW (ref 135–145)

## 2024-03-21 MED ORDER — TRAMADOL HCL 50 MG PO TABS
50.0000 mg | ORAL_TABLET | Freq: Three times a day (TID) | ORAL | 0 refills | Status: AC | PRN
  Filled 2024-03-21: qty 12, 4d supply, fill #0

## 2024-03-21 NOTE — Progress Notes (Signed)
" °  1 Day Post-Op   Chief Complaint/Subjective: Tolerating liquids, +flatus, pain moderate controlled with tramadol   Objective: Vital signs in last 24 hours: Temp:  [97.6 F (36.4 C)-99.3 F (37.4 C)] 99.3 F (37.4 C) (12/31 0943) Pulse Rate:  [67-89] 77 (12/31 0943) Resp:  [10-20] 16 (12/31 0943) BP: (105-153)/(47-97) 135/68 (12/31 0943) SpO2:  [94 %-100 %] 96 % (12/31 0943) Last BM Date : 03/19/24 Intake/Output from previous day: 12/30 0701 - 12/31 0700 In: 2110 [P.O.:960; I.V.:1050; IV Piggyback:100] Out: 2595 [Urine:2475; Blood:20]  PE: Gen: NAd Resp: nonlabored Card: RRR Abd: soft, incisions c/d/i  Lab Results:  Recent Labs    03/20/24 1557 03/21/24 0543  WBC 12.0* 11.2*  HGB 12.8 12.9  HCT 38.3 37.7  PLT 237 257   Recent Labs    03/20/24 1557 03/21/24 0543  NA  --  134*  K  --  3.9  CL  --  99  CO2  --  26  GLUCOSE  --  113*  BUN  --  9  CREATININE 0.75 0.69  CALCIUM   --  9.2   No results for input(s): LABPROT, INR in the last 72 hours.    Component Value Date/Time   NA 134 (L) 03/21/2024 0543   NA 140 06/29/2018 1320   K 3.9 03/21/2024 0543   CL 99 03/21/2024 0543   CO2 26 03/21/2024 0543   GLUCOSE 113 (H) 03/21/2024 0543   BUN 9 03/21/2024 0543   BUN 13 06/29/2018 1320   CREATININE 0.69 03/21/2024 0543   CREATININE 0.64 01/09/2024 1038   CREATININE 0.64 12/10/2019 1021   CALCIUM  9.2 03/21/2024 0543   PROT 7.8 01/09/2024 1038   PROT 6.4 06/29/2018 1320   ALBUMIN 4.4 01/09/2024 1038   ALBUMIN 4.1 06/29/2018 1320   AST 27 01/09/2024 1038   ALT 18 01/09/2024 1038   ALKPHOS 62 01/09/2024 1038   BILITOT 0.8 01/09/2024 1038   GFRNONAA >60 03/21/2024 0543   GFRNONAA >60 01/09/2024 1038   GFRAA 97 06/29/2018 1320    Assessment/Plan  s/p Procedures: COLECTOMY, RIGHT, LAPAROSCOPIC 03/20/2024    FEN - soft diet VTE - lovenox ID - periop Disposition - soft diet, await full bowel movement, ambulate   LOS: 1 day   I reviewed last 24  h vitals and pain scores, last 48 h intake and output, last 24 h labs and trends, and last 24 h imaging results.  This care required moderate level of medical decision making.   Kristen Schultz [facility] 03/21/2024, 12:04 PM Please see Amion for pager number during day hours 7:00am-4:30pm or 7:00am -11:30am on weekends   "

## 2024-03-21 NOTE — Plan of Care (Signed)
  Problem: Education: Goal: Verbalization of understanding of the causes of altered bowel function will improve Outcome: Progressing   Problem: Activity: Goal: Ability to tolerate increased activity will improve Outcome: Progressing   Problem: Bowel/Gastric: Goal: Gastrointestinal status for postoperative course will improve Outcome: Progressing

## 2024-03-21 NOTE — TOC Initial Note (Signed)
 Transition of Care [facility]) - Initial/Assessment Note    Patient Details  Name: Kristen Schultz MRN: [id number] Date of Birth: [birth year]  Transition of Care [facility]) CM/SW Contact:    Bascom Service, RN Phone Number: 03/21/2024, 12:48 PM  Clinical Narrative: Spoke to patient about d/c plans-d/c home,has PCP,pharmacy, own transport.                  Expected Discharge Plan: Home/Self Care Barriers to Discharge: Continued Medical Work up   Patient Goals and CMS Choice Patient states their goals for this hospitalization and ongoing recovery are:: Home CMS Medicare.gov Compare Post Acute Care list provided to:: Patient Choice offered to / list presented to : Patient      Expected Discharge Plan and Services   Discharge Planning Services: CM Consult Post Acute Care Choice: Resumption of Svcs/PTA Provider Living arrangements for the past 2 months: Single Family Home                                      Prior Living Arrangements/Services Living arrangements for the past 2 months: Single Family Home     Do you feel safe going back to the place where you live?: Yes               Activities of Daily Living   ADL Screening (condition at time of admission) Independently performs ADLs?: Yes (appropriate for developmental age) Is the patient deaf or have difficulty hearing?: Yes Does the patient have difficulty seeing, even when wearing glasses/contacts?: Yes Does the patient have difficulty concentrating, remembering, or making decisions?: Yes  Permission Sought/Granted Permission sought to share information with : Case Manager                Emotional Assessment              Admission diagnosis:  Cancer of appendix [employer] [facility]) [C18.1] Patient Active Problem List   Diagnosis Date Noted   Cancer of appendix (HCC) 03/20/2024   Mass of appendix 01/24/2024   Other fatigue 06/29/2022   Osteopenia 06/29/2022   Adult acne 01/18/2022   RUQ pain 06/11/2021    Skin rash 12/31/2020   Dysuria 12/31/2020   Hyperlipidemia 12/15/2020   B12 deficiency 12/10/2019   Palpitation 12/10/2019   Polyp of colon 12/10/2019   Adjustment disorder with mixed anxiety and depressed mood 12/10/2019   Bradycardia 12/10/2019   Elevated amylase 05/24/2018   Preventative health care 05/24/2018   Elevated rheumatoid factor 05/24/2018   Generalized anxiety disorder 11/11/2014   Essential hypertension 11/11/2014   PCP:  Kristen Cyndee Jamee JONELLE, DO Pharmacy:   [phone] - [place], [place] - [address] [place] [address] Phone: [phone] Fax: [phone]  [facility] [address] [place] [address] Phone: [phone] Fax: [phone]  [id number] - [place], [place] - [address] [facility] [address] [place] [address] Phone: [phone] Fax: [phone]     Social Drivers of Health (SDOH) Social History: SDOH Screenings   Food Insecurity: No Food Insecurity (03/20/2024)  Housing: Low Risk (03/20/2024)  Transportation Needs: No Transportation Needs (03/20/2024)  Utilities: Not At Risk (03/20/2024)  Alcohol Screen: Low Risk (01/16/2024)  Depression (PHQ2-9): High Risk (08/03/2023)  Financial Resource Strain: Low  Risk (01/16/2024)  Physical Activity: Insufficiently Active (01/16/2024)  Social Connections: Socially Integrated (03/20/2024)  Stress: No Stress Concern Present (01/16/2024)  Tobacco Use: Medium Risk (03/20/2024)  Health Literacy: Adequate Health Literacy (04/05/2023)   SDOH Interventions:     Readmission Risk Interventions     No data to display

## 2024-03-21 NOTE — Care Management Important Message (Signed)
 Important Message  Patient Details IM Letter given Name: Hadia Minier MRN: [id number] Date of Birth: [birth year]   Important Message Given:  Yes - Medicare IM     Marchelle, Rinella 03/21/2024, 12:48 PM

## 2024-03-21 NOTE — Progress Notes (Signed)
 Discharge meds in a secure bag delivered to inpatient pharmacy by this RN. Discharge will be 03/22/24 or 03/23/24- [facility] will be closed on 03/22/24

## 2024-03-21 NOTE — Discharge Instructions (Signed)
Managing Your Pain After Surgery Without Opioids    Thank you for participating in our program to help patients manage their pain after surgery without opioids. This is part of our effort to provide you with the best care possible, without exposing you or your family to the risk that opioids pose.  What pain can I expect after surgery? You can expect to have some pain after surgery. This is normal. The pain is typically worse the day after surgery, and quickly begins to get better. Many studies have found that many patients are able to manage their pain after surgery with Over-the-Counter (OTC) medications such as Tylenol and Motrin. If you have a condition that does not allow you to take Tylenol or Motrin, notify your surgical team.  How will I manage my pain? The best strategy for controlling your pain after surgery is around the clock pain control with Tylenol (acetaminophen) and Motrin (ibuprofen or Advil). Alternating these medications with each other allows you to maximize your pain control. In addition to Tylenol and Motrin, you can use heating pads or ice packs on your incisions to help reduce your pain.  How will I alternate your regular strength over-the-counter pain medication? You will take a dose of pain medication every three hours. Start by taking 650 mg of Tylenol (2 pills of 325 mg) 3 hours later take 600 mg of Motrin (3 pills of 200 mg) 3 hours after taking the Motrin take 650 mg of Tylenol 3 hours after that take 600 mg of Motrin.   - 1 -  See example - if your first dose of Tylenol is at 12:00 PM   12:00 PM Tylenol 650 mg (2 pills of 325 mg)  3:00 PM Motrin 600 mg (3 pills of 200 mg)  6:00 PM Tylenol 650 mg (2 pills of 325 mg)  9:00 PM Motrin 600 mg (3 pills of 200 mg)  Continue alternating every 3 hours   We recommend that you follow this schedule around-the-clock for at least 3 days after surgery, or until you feel that it is no longer needed. Use the table  on the last page of this handout to keep track of the medications you are taking. Important: Do not take more than '3000mg'$  of Tylenol or '3200mg'$  of Motrin in a 24-hour period. Do not take ibuprofen/Motrin if you have a history of bleeding stomach ulcers, severe kidney disease, &/or actively taking a blood thinner  What if I still have pain? If you have pain that is not controlled with the over-the-counter pain medications (Tylenol and Motrin or Advil) you might have what we call "breakthrough" pain. You will receive a prescription for a small amount of an opioid pain medication such as Oxycodone, Tramadol, or Tylenol with Codeine. Use these opioid pills in the first 24 hours after surgery if you have breakthrough pain. Do not take more than 1 pill every 4-6 hours.  If you still have uncontrolled pain after using all opioid pills, don't hesitate to call our staff using the number provided. We will help make sure you are managing your pain in the best way possible, and if necessary, we can provide a prescription for additional pain medication.   Day 1    Time  Name of Medication Number of pills taken  Amount of Acetaminophen  Pain Level   Comments  AM PM       AM PM       AM PM  AM PM       AM PM       AM PM       AM PM       AM PM       Total Daily amount of Acetaminophen Do not take more than  3,000 mg per day      Day 2    Time  Name of Medication Number of pills taken  Amount of Acetaminophen  Pain Level   Comments  AM PM       AM PM       AM PM       AM PM       AM PM       AM PM       AM PM       AM PM       Total Daily amount of Acetaminophen Do not take more than  3,000 mg per day      Day 3    Time  Name of Medication Number of pills taken  Amount of Acetaminophen  Pain Level   Comments  AM PM       AM PM       AM PM       AM PM         AM PM       AM PM       AM PM       AM PM       Total Daily amount of Acetaminophen Do not take more  than  3,000 mg per day      Day 4    Time  Name of Medication Number of pills taken  Amount of Acetaminophen  Pain Level   Comments  AM PM       AM PM       AM PM       AM PM       AM PM       AM PM       AM PM       AM PM       Total Daily amount of Acetaminophen Do not take more than  3,000 mg per day      Day 5    Time  Name of Medication Number of pills taken  Amount of Acetaminophen  Pain Level   Comments  AM PM       AM PM       AM PM       AM PM       AM PM       AM PM       AM PM       AM PM       Total Daily amount of Acetaminophen Do not take more than  3,000 mg per day      Day 6    Time  Name of Medication Number of pills taken  Amount of Acetaminophen  Pain Level  Comments  AM PM       AM PM       AM PM       AM PM       AM PM       AM PM       AM PM       AM PM       Total Daily amount of Acetaminophen Do not take more than  3,000 mg  per day      Day 7    Time  Name of Medication Number of pills taken  Amount of Acetaminophen  Pain Level   Comments  AM PM       AM PM       AM PM       AM PM       AM PM       AM PM       AM PM       AM PM       Total Daily amount of Acetaminophen Do not take more than  3,000 mg per day        For additional information about how and where to safely dispose of unused opioid medications - [employer]  Disclaimer: This document contains information and/or instructional materials adapted from [place] for the typical patient with your condition. It does not replace medical advice from your health care provider because your experience may differ from that of the typical patient. Talk to your health care provider if you have any questions about this document, your condition or your treatment plan. Adapted from [place]

## 2024-03-22 NOTE — Discharge Summary (Signed)
 Physician Discharge Summary  Patient ID: Kristen Schultz MRN: [id number] DOB/AGE: 79-May-1947 79 y.o.  Admit date: 03/20/2024 Discharge date: 03/22/2024  Admission Diagnoses: Appendiceal cancer htn Discharge Diagnoses:  Principal Problem:   Cancer of appendix [facility])   Discharged Condition: good  Hospital Course: 79 yof who underwent appendectomy with path being cancer now admitted for lap right colon. She has done well postop. Pain controlled, tol diet, having bowel function. She is ready for dc  Consults: None  Significant Diagnostic Studies: none  Treatments: surgery: lap right colon  Discharge Exam: Blood pressure (!) 163/85, pulse 61, temperature 98.3 F (36.8 C), temperature source Oral, resp. rate 18, height 5' 4 (1.626 m), weight 71 kg, SpO2 97%. General nad Cv regular Pulm effort normal Ab incisions clean approp tender  Disposition: Discharge disposition: 01-Home or Self Care        Allergies as of 03/22/2024       Reactions   Bee Venom Anaphylaxis   Bee stings   Codeine    Extreme Vomiting   Other Other (See Comments)   CDN - reaction unknown   Penicillins    Redness, swelling and itching Can take Keflex    Tetracyclines & Related    Yeast Infection and Stomach problems   Xylocaine  Dental [lidocaine -epinephrine  (pf)]         Medication List     TAKE these medications    citalopram  10 MG tablet Commonly known as: CELEXA  1 1/2 TABS EVERY DAY   losartan  50 MG tablet Commonly known as: COZAAR  TAKE 1 TABLET BY MOUTH EVERY DAY   multivitamin with minerals Tabs tablet Take 1 tablet by mouth in the morning. Women's Advanced 50+ Multivitamin   traMADol  50 MG tablet Commonly known as: ULTRAM  Take 1 tablet (50 mg total) by mouth every 8 (eight) hours as needed for up to 5 days.        Follow-up Information     Kinsinger, Herlene Righter, MD Follow up on 04/10/2024.   Specialty: General Surgery Contact information: [address] [place] [address] [phone]                 Signed: Donnice Bury 03/22/2024, 7:58 AM

## 2024-03-22 NOTE — Progress Notes (Signed)
 TOC discharge meds picked up from [facility] in-pt pharmacy and delivered to pt at bedside.

## 2024-03-23 LAB — SURGICAL PATHOLOGY

## 2024-04-10 ENCOUNTER — Ambulatory Visit (INDEPENDENT_AMBULATORY_CARE_PROVIDER_SITE_OTHER): Payer: Medicare (Managed Care)

## 2024-04-10 VITALS — BP 128/64 | HR 79 | Temp 98.4°F | Ht 64.0 in | Wt 148.6 lb

## 2024-04-10 DIAGNOSIS — Z1231 Encounter for screening mammogram for malignant neoplasm of breast: Secondary | ICD-10-CM

## 2024-04-10 DIAGNOSIS — Z0000 Encounter for general adult medical examination without abnormal findings: Secondary | ICD-10-CM | POA: Diagnosis not present

## 2024-04-10 DIAGNOSIS — I10 Essential (primary) hypertension: Secondary | ICD-10-CM | POA: Diagnosis not present

## 2024-04-10 NOTE — Patient Instructions (Addendum)
 Kristen Schultz,  Thank you for taking the time for your Medicare Wellness Visit. I appreciate your continued commitment to your health goals. Please review the care plan we discussed, and feel free to reach out if I can assist you further.  Please note that Annual Wellness Visits do not include a physical exam. Some assessments may be limited, especially if the visit was conducted virtually. If needed, we may recommend an in-person follow-up with your provider.  Ongoing Care Seeing your primary care provider every 3 to 6 months helps us  monitor your health and provide consistent, personalized care.   Referrals If a referral was made during today's visit and you haven't received any updates within two weeks, please contact the referred provider directly to check on the status.  Recommended Screenings:  Health Maintenance  Topic Date Due   COVID-19 Vaccine (6 - 2025-26 season) 11/21/2023   Breast Cancer Screening  04/27/2024   Flu Shot  06/19/2024*   Osteoporosis screening with Bone Density Scan  01/17/2025   Medicare Annual Wellness Visit  04/10/2025   Colon Cancer Screening  11/17/2027   DTaP/Tdap/Td vaccine (3 - Td or Tdap) 07/04/2029   Pneumococcal Vaccine for age over 23  Completed   Hepatitis C Screening  Completed   Zoster (Shingles) Vaccine  Completed   Meningitis B Vaccine  Aged Out  *Topic was postponed. The date shown is not the original due date.       04/10/2024    8:55 AM  Advanced Directives  Does Patient Have a Medical Advance Directive? Yes  Type of Estate Agent of [place];Living will  Does patient want to make changes to medical advance directive? No - Patient declined  Copy of Healthcare Power of Attorney in Chart? Yes - validated most recent copy scanned in chart (See row information)    Vision: Annual vision screenings are recommended for early detection of glaucoma, cataracts, and diabetic retinopathy. These exams can also reveal signs of  chronic conditions such as diabetes and high blood pressure.  Dental: Annual dental screenings help detect early signs of oral cancer, gum disease, and other conditions linked to overall health, including heart disease and diabetes.  Please see the attached documents for additional preventive care recommendations.

## 2024-04-10 NOTE — Progress Notes (Signed)
 "  Chief Complaint  Patient presents with   Medicare Wellness     Subjective:   Kristen Schultz is a 79 y.o. female who presents for a Medicare Annual Wellness Visit.  Visit info / Clinical Intake: Medicare Wellness Visit Type:: Subsequent Annual Wellness Visit Persons participating in visit and providing information:: patient Medicare Wellness Visit Mode:: In-person (required for WTM) Interpreter Needed?: No Pre-visit prep was completed: yes AWV questionnaire completed by patient prior to visit?: yes Date:: 04/09/24 Living arrangements:: lives with spouse/significant other Patient's Overall Health Status Rating: very good Typical amount of pain: some Does pain affect daily life?: no (Patient stated stiffness in the AM)  Dietary Habits and Nutritional Risks How many meals a day?: 3 Eats fruit and vegetables daily?: yes Most meals are obtained by: preparing own meals In the last 2 weeks, have you had any of the following?: none Diabetic:: no  Functional Status Activities of Daily Living (to include ambulation/medication): Independent Ambulation: Independent with device- listed below Home Assistive Devices/Equipment: Eyeglasses Medication Administration: Independent Home Management (perform basic housework or laundry): Independent Manage your own finances?: yes Primary transportation is: driving Concerns about vision?: no *vision screening is required for WTM* Concerns about hearing?: no  Fall Screening Falls in the past year?: 1 Number of falls in past year: 0 Was there an injury with Fall?: 1 (Skin tear to left leg. No medical attention needed) Fall Risk Category Calculator: 2 Patient Fall Risk Level: Moderate Fall Risk  Fall Risk Patient at Risk for Falls Due to: Other (Comment) (Hit toe on step lost balance) Fall risk Follow up: Falls evaluation completed  Home and Transportation Safety: All rugs have non-skid backing?: yes All stairs or steps have railings?:  yes Grab bars in the bathtub or shower?: yes Have non-skid surface in bathtub or shower?: yes Good home lighting?: yes Regular seat belt use?: yes Hospital stays in the last year:: (!) yes How many hospital stays:: 1 Reason: Colon surgery  Cognitive Assessment Difficulty concentrating, remembering, or making decisions? : yes Will 6CIT or Mini Cog be Completed: yes What year is it?: 0 points What month is it?: 0 points Give patient an address phrase to remember (5 components): [address] [place]  About what time is it?: 0 points Count backwards from 20 to 1: 0 points Say the months of the year in reverse: 0 points Repeat the address phrase from earlier: 0 points 6 CIT Score: 0 points  Advance Directives (For Healthcare) Does Patient Have a Medical Advance Directive?: Yes Does patient want to make changes to medical advance directive?: No - Patient declined Type of Advance Directive: Healthcare Power of Mobile; Living will Copy of Healthcare Power of Attorney in Chart?: Yes - validated most recent copy scanned in chart (See row information) Copy of Living Will in Chart?: Yes - validated most recent copy scanned in chart (See row information)  Reviewed/Updated  Reviewed/Updated: Reviewed All (Medical, Surgical, Family, Medications, Allergies, Care Teams, Patient Goals)    Allergies (verified) Bee venom, Codeine, Other, Penicillins, Tetracyclines & related, and Xylocaine  dental [lidocaine -epinephrine  (pf)]   Current Medications (verified) Outpatient Encounter Medications as of 04/10/2024  Medication Sig   citalopram  (CELEXA ) 10 MG tablet 1 1/2 TABS EVERY DAY (Patient not taking: Reported on 04/10/2024)   losartan  (COZAAR ) 50 MG tablet TAKE 1 TABLET BY MOUTH EVERY DAY (Patient not taking: Reported on 04/10/2024)   Multiple Vitamin (MULTIVITAMIN WITH MINERALS) TABS tablet Take 1 tablet by mouth in the morning. Women's  Advanced 50+ Multivitamin (Patient not taking: Reported  on 04/10/2024)   No facility-administered encounter medications on file as of 04/10/2024.    History: Past Medical History:  Diagnosis Date   Anxiety    Arthritis    Cellulitis of chin 11/17/2020   Colon polyps    Complication of anesthesia    COVID-19 11/17/2020   Gastric ulcer    per patient; in the 1970s   GERD (gastroesophageal reflux disease)    H. pylori infection    Heart murmur    ECHO 12-13-2019   Hyperlipidemia    no treatment per pt   Hypertension    Liver cyst    Ovarian cyst 1984   Pneumonia    SUI (stress urinary incontinence, female)    Past Surgical History:  Procedure Laterality Date   COLONOSCOPY  02/10/2016   DENTAL SURGERY  2000   dental implant   DENTAL SURGERY  2021   2 molars taken out    ESOPHAGOGASTRODUODENOSCOPY  11/06/2018   [facility]    ESOPHAGOGASTRODUODENOSCOPY  07/23/2019   [facility]    EUS  07/23/2019   [facility]    HEMORRHOID SURGERY     LAPAROSCOPIC APPENDECTOMY N/A 01/24/2024   Procedure: APPENDECTOMY, LAPAROSCOPIC;  Surgeon: Stevie Herlene Righter, MD;  Location: [facility];  Service: General;  Laterality: N/A;   LAPAROSCOPIC LIVER CYST REMOVAL     at 79 years old   LAPAROSCOPIC RIGHT COLECTOMY Right 03/20/2024   Procedure: COLECTOMY, RIGHT, LAPAROSCOPIC;  Surgeon: Stevie Herlene Righter, MD;  Location: [facility];  Service: General;  Laterality: Right;  LAPAROSCOPIC RIGHT COLECTOMY WITH ILEUM TO COLON ANASTOMOSIS   OVARIAN CYST SURGERY Right    benign   Family History  Problem Relation Age of Onset   Heart disease Father    Heart failure Sister    Heart disease Sister    Dementia Sister    Heart disease Brother 30   Heart disease Brother    Alzheimer's disease Brother    Dementia Brother    Colon cancer Neg Hx    Esophageal cancer Neg Hx    Pancreatic cancer Neg Hx    Stomach cancer Neg Hx    Rectal cancer Neg Hx    BRCA 1/2 Neg Hx    Breast cancer Neg Hx    Cancer - Colon Neg Hx    Fibrocystic  breast disease Neg Hx    Ovarian cancer Neg Hx    Endometrial cancer Neg Hx    Prostate cancer Neg Hx    Social History   Occupational History   Occupation: retired    Comment: retired  Tobacco Use   Smoking status: Former    Current packs/day: 0.00    Average packs/day: 0.3 packs/day for 15.0 years (4.5 ttl pk-yrs)    Types: Cigarettes    Start date: 11/18/1970    Quit date: 11/17/1985    Years since quitting: 38.4    Passive exposure: Never   Smokeless tobacco: Never  Vaping Use   Vaping status: Never Used  Substance and Sexual Activity   Alcohol use: Yes    Alcohol/week: 14.0 standard drinks of alcohol    Types: 14 Glasses of wine per week    Comment: 2 glasses per night   Drug use: No   Sexual activity: Not Currently    Partners: Male   Tobacco Counseling Counseling given: No  SDOH Screenings   Food Insecurity: No Food Insecurity (04/10/2024)  Housing: Unknown (  04/10/2024)  Transportation Needs: No Transportation Needs (04/10/2024)  Utilities: Not At Risk (04/10/2024)  Alcohol Screen: Low Risk (01/16/2024)  Depression (PHQ2-9): Low Risk (04/10/2024)  Financial Resource Strain: Low Risk (01/16/2024)  Physical Activity: Inactive (04/10/2024)  Social Connections: Socially Integrated (04/10/2024)  Stress: No Stress Concern Present (04/10/2024)  Tobacco Use: Medium Risk (04/10/2024)  Health Literacy: Adequate Health Literacy (04/10/2024)   See flowsheets for full screening details  Depression Screen PHQ 2 & 9 Depression Scale- Over the past 2 weeks, how often have you been bothered by any of the following problems? Little interest or pleasure in doing things: 0 Feeling down, depressed, or hopeless (PHQ Adolescent also includes...irritable): 0 PHQ-2 Total Score: 0 Trouble falling or staying asleep, or sleeping too much: 2 Feeling tired or having little energy: 2 Poor appetite or overeating (PHQ Adolescent also includes...weight loss): 2 Feeling bad about yourself - or  that you are a failure or have let yourself or your family down: 2 Trouble concentrating on things, such as reading the newspaper or watching television (PHQ Adolescent also includes...like school work): 2 Moving or speaking so slowly that other people could have noticed. Or the opposite - being so fidgety or restless that you have been moving around a lot more than usual: 0 Thoughts that you would be better off dead, or of hurting yourself in some way: 0 PHQ-9 Total Score: 11 If you checked off any problems, how difficult have these problems made it for you to do your work, take care of things at home, or get along with other people?: Very difficult  Depression Treatment Depression Interventions/Treatment : Medication     Goals Addressed               This Visit's Progress     Remain active (pt-stated)               Objective:    Today's Vitals   04/10/24 0843  BP: 128/64  Pulse: 79  Temp: 98.4 F (36.9 C)  TempSrc: Oral  SpO2: 91%  Weight: 148 lb 9.6 oz (67.4 kg)  Height: 5' 4 (1.626 m)   Body mass index is 25.51 kg/m.  Hearing/Vision screen Hearing Screening - Comments:: Denies hearing difficulties   Vision Screening - Comments:: Wears rx glasses - up to date with routine eye exams with  [facility] Immunizations and Health Maintenance Health Maintenance  Topic Date Due   COVID-19 Vaccine (6 - 2025-26 season) 11/21/2023   Mammogram  04/27/2024   Influenza Vaccine  06/19/2024 (Originally 10/21/2023)   Bone Density Scan  01/17/2025   Medicare Annual Wellness (AWV)  04/10/2025   Colonoscopy  11/17/2027   DTaP/Tdap/Td (3 - Td or Tdap) 07/04/2029   Pneumococcal Vaccine: 50+ Years  Completed   Hepatitis C Screening  Completed   Zoster Vaccines- Shingrix  Completed   Meningococcal B Vaccine  Aged Out        Assessment/Plan:  This is a routine wellness examination for [place].  Patient Care Team: Antonio Meth, Jamee SAUNDERS, DO as PCP - General (Family  Medicine) Dann Candyce RAMAN, MD as PCP - Cardiology (Cardiology) Elspeth Lauraine DEL, OD as Referring Physician (Optometry) Cleatrice, Ludie SAUNDERS, MD as Consulting Physician (Sports Medicine) Camella Fallow, MD as Consulting Physician (Orthopedic Surgery) Blanca Fallow RAMAN, MD as Consulting Physician (Cardiology) [place], MD as Referring Physician Charlanne Kipper, MD as Referring Physician (Gastroenterology)  I have personally reviewed and noted the following in the patients chart:   Medical  and social history Use of alcohol, tobacco or illicit drugs  Current medications and supplements including opioid prescriptions. Functional ability and status Nutritional status Physical activity Advanced directives List of other physicians Hospitalizations, surgeries, and ER visits in previous 12 months Vitals Screenings to include cognitive, depression, and falls Referrals and appointments  Orders Placed This Encounter  Procedures   MM 3D SCREENING MAMMOGRAM BILATERAL BREAST    Standing Status:   Future    Expiration Date:   04/10/2025    Reason for Exam (SYMPTOM  OR DIAGNOSIS REQUIRED):   Breast cancer screening    Preferred imaging location?:   [facility]   In addition, I have reviewed and discussed with patient certain preventive protocols, quality metrics, and best practice recommendations. A written personalized care plan for preventive services as well as general preventive health recommendations were provided to patient.   Rojelio LELON Blush, LPN   8/79/7973   Return in 53 weeks (on 04/16/2025).  After Visit Summary: (In Person-Printed) AVS printed and given to the patient  Nurse Notes: HM Addressed: Mammogram ordered "

## 2024-04-17 ENCOUNTER — Ambulatory Visit: Admitting: Family Medicine

## 2024-05-09 ENCOUNTER — Ambulatory Visit

## 2025-04-16 ENCOUNTER — Ambulatory Visit
